# Patient Record
Sex: Male | Born: 1940 | Race: White | Hispanic: No | Marital: Married | Smoking: Former smoker
Health system: Southern US, Community
[De-identification: ages and names within clinical notes are randomized; demographics above are authoritative.]

## PROBLEM LIST (undated history)

## (undated) DIAGNOSIS — J61 Pneumoconiosis due to asbestos and other mineral fibers: Secondary | ICD-10-CM

## (undated) DIAGNOSIS — I10 Essential (primary) hypertension: Secondary | ICD-10-CM

## (undated) DIAGNOSIS — R972 Elevated prostate specific antigen [PSA]: Secondary | ICD-10-CM

## (undated) DIAGNOSIS — Z950 Presence of cardiac pacemaker: Secondary | ICD-10-CM

## (undated) DIAGNOSIS — E119 Type 2 diabetes mellitus without complications: Secondary | ICD-10-CM

## (undated) DIAGNOSIS — S02609A Fracture of mandible, unspecified, initial encounter for closed fracture: Secondary | ICD-10-CM

## (undated) DIAGNOSIS — M1990 Unspecified osteoarthritis, unspecified site: Secondary | ICD-10-CM

## (undated) DIAGNOSIS — Z87442 Personal history of urinary calculi: Secondary | ICD-10-CM

## (undated) DIAGNOSIS — K219 Gastro-esophageal reflux disease without esophagitis: Secondary | ICD-10-CM

## (undated) DIAGNOSIS — J189 Pneumonia, unspecified organism: Secondary | ICD-10-CM

## (undated) DIAGNOSIS — K862 Cyst of pancreas: Secondary | ICD-10-CM

## (undated) DIAGNOSIS — N529 Male erectile dysfunction, unspecified: Secondary | ICD-10-CM

## (undated) DIAGNOSIS — E785 Hyperlipidemia, unspecified: Secondary | ICD-10-CM

## (undated) DIAGNOSIS — I8390 Asymptomatic varicose veins of unspecified lower extremity: Secondary | ICD-10-CM

## (undated) DIAGNOSIS — N400 Enlarged prostate without lower urinary tract symptoms: Secondary | ICD-10-CM

## (undated) DIAGNOSIS — R51 Headache: Secondary | ICD-10-CM

## (undated) DIAGNOSIS — R519 Headache, unspecified: Secondary | ICD-10-CM

## (undated) HISTORY — PX: COLONOSCOPY: SHX174

## (undated) HISTORY — DX: Male erectile dysfunction, unspecified: N52.9

## (undated) HISTORY — DX: Essential (primary) hypertension: I10

## (undated) HISTORY — PX: HIP SURGERY: SHX245

## (undated) HISTORY — PX: NOSE SURGERY: SHX723

## (undated) HISTORY — DX: Gastro-esophageal reflux disease without esophagitis: K21.9

## (undated) HISTORY — DX: Asymptomatic varicose veins of unspecified lower extremity: I83.90

## (undated) HISTORY — DX: Benign prostatic hyperplasia without lower urinary tract symptoms: N40.0

## (undated) HISTORY — DX: Cyst of pancreas: K86.2

## (undated) HISTORY — DX: Hyperlipidemia, unspecified: E78.5

## (undated) HISTORY — PX: BACK SURGERY: SHX140

## (undated) HISTORY — DX: Elevated prostate specific antigen (PSA): R97.20

## (undated) HISTORY — DX: Fracture of mandible, unspecified, initial encounter for closed fracture: S02.609A

## (undated) HISTORY — DX: Type 2 diabetes mellitus without complications: E11.9

---

## 2001-04-20 ENCOUNTER — Emergency Department (HOSPITAL_COMMUNITY): Admission: EM | Admit: 2001-04-20 | Discharge: 2001-04-20 | Payer: Self-pay | Admitting: Emergency Medicine

## 2005-09-19 ENCOUNTER — Ambulatory Visit: Payer: Self-pay | Admitting: Internal Medicine

## 2005-09-28 LAB — HM COLONOSCOPY

## 2005-10-04 ENCOUNTER — Ambulatory Visit: Payer: Self-pay | Admitting: Internal Medicine

## 2006-12-26 ENCOUNTER — Encounter: Admission: RE | Admit: 2006-12-26 | Discharge: 2006-12-26 | Payer: Self-pay | Admitting: Orthopaedic Surgery

## 2008-04-08 ENCOUNTER — Ambulatory Visit (HOSPITAL_BASED_OUTPATIENT_CLINIC_OR_DEPARTMENT_OTHER): Admission: RE | Admit: 2008-04-08 | Discharge: 2008-04-08 | Payer: Self-pay | Admitting: Urology

## 2008-04-08 ENCOUNTER — Encounter (INDEPENDENT_AMBULATORY_CARE_PROVIDER_SITE_OTHER): Payer: Self-pay | Admitting: Urology

## 2011-01-10 NOTE — Op Note (Signed)
NAMEBRIGGS, Edgar Evans                ACCOUNT NO.:  [id number]   MEDICAL RECORD NO.:  [id number]          PATIENT TYPE:  AMB   LOCATION:  [facility]                         FACILITY:  [facility]   PHYSICIAN:  Valetta Fuller, M.D.  DATE OF BIRTH:  [birth year]   DATE OF PROCEDURE:  04/08/2008  DATE OF DISCHARGE:                               OPERATIVE REPORT   PREOPERATIVE DIAGNOSIS:  BPH (benign prostatic hypertrophy) with  obstruction.   POSTOPERATIVE DIAGNOSIS:  BPH (benign prostatic hypertrophy) with  obstruction.   PROCEDURE:  Transurethral resection of prostate.   ATTENDING PHYSICIAN:  Valetta Fuller, M.D.   RESIDENT PHYSICIAN:  Dr. Delman Kitten.   ANESTHESIA:  Was general.   INDICATIONS FOR PROCEDURE:  Edgar Evans is a 70 year old white male who  has been followed by Dr. Isabel Caprice for obstructive lower urinary tract  symptoms.  Initially he did respond to alpha blocker therapy.  However,  recently he has had less of a response.  He did have an approximately 60  gram gland on prostatic ultrasound and he has had a normal PSA recently.  In light of his longstanding voiding issues and diminished response now  to alpha blocker therapy with a peak flow of 7 mL per second and a mean  flow of 4 mL per second operative intervention was recommended after  discussion with Edgar Evans preoperatively regarding risks, benefits,  consequences and all concerns.  He elected to undergo transurethral  resection of the prostate and informed consent was obtained.   PROCEDURE IN DETAIL:  The patient was brought to the operating room and  placed in supine position.  He was correctly identified by his wrist  band and appropriate time-out was taken.  IV antibiotics were  administered and general anesthesia was delivered.  Once adequately  anesthetized he was placed in dorsal lithotomy position with great care  taken to minimize risk of peripheral neuropathy or compartment syndrome.  His perineum was prepped and  draped sterilely.  We began our procedure  by performing urethral dilation/calibration with male Sissy Hoff sounds.  He dilated very easily to 86 [place] but there was some resistance at 84  [place].  We were able to pass the 28 French rigid resectoscope sheath  over the obturator without significant difficulty however.  At this  point we inserted our scope and performed cystoscopy.  Both ureteral  orifices were noted be in the normal anatomic position effluxing clear  urine.  There were no urothelial abnormalities, foreign bodies, stones  or diverticula in the bladder.  We moved the scope into the urethra and  found that he had a high median bar as well as significant bilobar  prostatic hypertrophy with coaptation of the lateral lobes in the  midline.  We then initially started our resection with the gyrus PK  bipolar button starting on the patient's left lateral lobe.  We used  this to evaporate a fair amount of adenomatous tissue.  However, we soon  appreciated the true size of his gland and elected to use a bipolar  resectoscope loop from this point on.  Normal saline was our irrigant.  We continued our resection on the left lateral lobe and then moved to  his right lateral lobe as well as addressing his small median lobe but  high median bar.  We resected to a point where his bladder neck and  prostatic urethra were widely patent.  He still did have residual  adenomatous tissue circumferentially but we believe that we have removed  a significant amount of his adenoma which should significantly improve  his urinary symptoms.  We then spent a fair amount of time establishing  hemostasis with the electrocautery loop as well.  We evacuated the  bladder of all prostate chips.  We reinspected his bladder and both  ureteral orifices were still in their normal anatomic position and were  uninjured during our resection.  We then removed our cystoscope and  placed a 24 [place] three way Foley  catheter per urethra without any  difficulty.  We inflated the balloon with 40 mL of fluid and placed the  catheter on traction.  He was initially bloody but this drainage soon  improved after holding traction for a few minutes.  He was connected to  continuous bladder irrigation and this marked the end of our procedure.  He awoke from anesthesia and was taken to recovery room in stable  condition.  There were no complications.  Dr. Isabel Caprice was present and  participated in all aspects of the case.      Melina Schools, MD      Valetta Fuller, M.D.  Electronically Signed    JR/MEDQ  D:  04/08/2008  T:  04/08/2008  Job:  [id number]

## 2011-05-26 LAB — DIFFERENTIAL
Basophils Absolute: 0
Basophils Relative: 0
Eosinophils Absolute: 0
Eosinophils Relative: 0
Lymphocytes Relative: 6 — ABNORMAL LOW
Lymphs Abs: 0.7
Monocytes Absolute: 0.5
Monocytes Relative: 4
Neutro Abs: 10.7 — ABNORMAL HIGH
Neutrophils Relative %: 90 — ABNORMAL HIGH

## 2011-05-26 LAB — CBC
HCT: 36.4 — ABNORMAL LOW
Hemoglobin: 12 — ABNORMAL LOW
MCHC: 33.1
MCV: 89.8
Platelets: 138 — ABNORMAL LOW
RBC: 4.05 — ABNORMAL LOW
RDW: 12.5
WBC: 11.9 — ABNORMAL HIGH

## 2011-05-26 LAB — POCT I-STAT 4, (NA,K, GLUC, HGB,HCT)
Glucose, Bld: 125 — ABNORMAL HIGH
HCT: 42
Hemoglobin: 14.3
Potassium: 5
Sodium: 140

## 2012-10-16 ENCOUNTER — Encounter: Payer: Self-pay | Admitting: *Deleted

## 2013-12-28 ENCOUNTER — Emergency Department (HOSPITAL_COMMUNITY): Payer: Medicare Other

## 2013-12-28 ENCOUNTER — Encounter (HOSPITAL_COMMUNITY)
Admission: EM | Disposition: A | Discharge status: Home / Self Care | Payer: Self-pay | Source: Home / Self Care | Attending: Emergency Medicine

## 2013-12-28 ENCOUNTER — Encounter (HOSPITAL_COMMUNITY): Payer: Self-pay | Admitting: Emergency Medicine

## 2013-12-28 ENCOUNTER — Ambulatory Visit (HOSPITAL_COMMUNITY): Admit: 2013-12-28 | Payer: Self-pay | Admitting: Internal Medicine

## 2013-12-28 ENCOUNTER — Ambulatory Visit (HOSPITAL_COMMUNITY)
Admission: EM | Admit: 2013-12-28 | Discharge: 2013-12-29 | Disposition: A | Discharge status: Home / Self Care | Payer: Medicare Other | Attending: Internal Medicine | Admitting: Internal Medicine

## 2013-12-28 DIAGNOSIS — Z87891 Personal history of nicotine dependence: Secondary | ICD-10-CM | POA: Insufficient documentation

## 2013-12-28 DIAGNOSIS — Z7982 Long term (current) use of aspirin: Secondary | ICD-10-CM | POA: Insufficient documentation

## 2013-12-28 DIAGNOSIS — I442 Atrioventricular block, complete: Secondary | ICD-10-CM

## 2013-12-28 DIAGNOSIS — E785 Hyperlipidemia, unspecified: Secondary | ICD-10-CM | POA: Insufficient documentation

## 2013-12-28 DIAGNOSIS — K219 Gastro-esophageal reflux disease without esophagitis: Secondary | ICD-10-CM | POA: Insufficient documentation

## 2013-12-28 DIAGNOSIS — F528 Other sexual dysfunction not due to a substance or known physiological condition: Secondary | ICD-10-CM | POA: Insufficient documentation

## 2013-12-28 DIAGNOSIS — R55 Syncope and collapse: Secondary | ICD-10-CM

## 2013-12-28 DIAGNOSIS — N400 Enlarged prostate without lower urinary tract symptoms: Secondary | ICD-10-CM | POA: Insufficient documentation

## 2013-12-28 HISTORY — PX: PACEMAKER INSERTION: SHX728

## 2013-12-28 HISTORY — PX: PERMANENT PACEMAKER INSERTION: SHX5480

## 2013-12-28 LAB — BASIC METABOLIC PANEL
BUN: 19 mg/dL (ref 6–23)
CO2: 27 mEq/L (ref 19–32)
Calcium: 9.2 mg/dL (ref 8.4–10.5)
Chloride: 102 mEq/L (ref 96–112)
Creatinine, Ser: 1.02 mg/dL (ref 0.50–1.35)
GFR calc Af Amer: 81 mL/min — ABNORMAL LOW (ref >90)
GFR calc non Af Amer: 70 mL/min — ABNORMAL LOW (ref >90)
Glucose, Bld: 161 mg/dL — ABNORMAL HIGH (ref 70–99)
Potassium: 3.8 mEq/L (ref 3.7–5.3)
Sodium: 141 mEq/L (ref 137–147)

## 2013-12-28 LAB — CBC WITH DIFFERENTIAL/PLATELET
Basophils Absolute: 0 10*3/uL (ref 0.0–0.1)
Basophils Relative: 0 % (ref 0–1)
Eosinophils Absolute: 0.3 10*3/uL (ref 0.0–0.7)
Eosinophils Relative: 5 % (ref 0–5)
HCT: 41.2 % (ref 39.0–52.0)
Hemoglobin: 13.9 g/dL (ref 13.0–17.0)
Lymphocytes Relative: 45 % (ref 12–46)
Lymphs Abs: 2 10*3/uL (ref 0.7–4.0)
MCH: 29.4 pg (ref 26.0–34.0)
MCHC: 33.7 g/dL (ref 30.0–36.0)
MCV: 87.3 fL (ref 78.0–100.0)
Monocytes Absolute: 0.3 10*3/uL (ref 0.1–1.0)
Monocytes Relative: 7 % (ref 3–12)
Neutro Abs: 2 10*3/uL (ref 1.7–7.7)
Neutrophils Relative %: 43 % (ref 43–77)
Platelets: 152 10*3/uL (ref 150–400)
RBC: 4.72 MIL/uL (ref 4.22–5.81)
RDW: 12.6 % (ref 11.5–15.5)
WBC: 4.6 10*3/uL (ref 4.0–10.5)

## 2013-12-28 LAB — TYPE AND SCREEN
ABO/RH(D): A POS
Antibody Screen: NEGATIVE

## 2013-12-28 LAB — ABO/RH: ABO/RH(D): A POS

## 2013-12-28 LAB — I-STAT TROPONIN, ED: Troponin i, poc: 0 ng/mL (ref 0.00–0.08)

## 2013-12-28 LAB — MAGNESIUM: Magnesium: 1.9 mg/dL (ref 1.5–2.5)

## 2013-12-28 SURGERY — PERMANENT PACEMAKER INSERTION
Anesthesia: LOCAL

## 2013-12-28 MED ORDER — SODIUM CHLORIDE 0.9 % IV SOLN: 250.0000 mL | INTRAVENOUS | Status: DC | PRN

## 2013-12-28 MED ORDER — SODIUM CHLORIDE 0.9 % IJ SOLN
3.0000 mL | Freq: Two times a day (BID) | INTRAMUSCULAR | Status: DC
Start: 2013-12-28 — End: 2013-12-29
  Administered 2013-12-28: 3 mL via INTRAVENOUS

## 2013-12-28 MED ORDER — ONDANSETRON HCL 4 MG/2ML IJ SOLN: 4.0000 mg | Freq: Four times a day (QID) | INTRAMUSCULAR | Status: DC | PRN

## 2013-12-28 MED ORDER — VANCOMYCIN HCL IN DEXTROSE 1-5 GM/200ML-% IV SOLN
1000.0000 mg | INTRAVENOUS | Status: DC
  Filled 2013-12-28: qty 200

## 2013-12-28 MED ORDER — SODIUM CHLORIDE 0.9 % IV BOLUS (SEPSIS): 1000.0000 mL | Freq: Once | INTRAVENOUS | Status: DC

## 2013-12-28 MED ORDER — SODIUM CHLORIDE 0.9 % IV SOLN: INTRAVENOUS | Status: DC

## 2013-12-28 MED ORDER — SODIUM CHLORIDE 0.9 % IJ SOLN: 3.0000 mL | INTRAMUSCULAR | Status: DC | PRN

## 2013-12-28 MED ORDER — HYDROCODONE-ACETAMINOPHEN 5-325 MG PO TABS
1.0000 | ORAL_TABLET | ORAL | Status: DC | PRN
  Administered 2013-12-28: 1 via ORAL
  Filled 2013-12-28: qty 1

## 2013-12-28 MED ORDER — SODIUM CHLORIDE 0.9 % IV SOLN
Freq: Once | INTRAVENOUS | Status: AC
  Administered 2013-12-28: 06:00:00 via INTRAVENOUS

## 2013-12-28 MED ORDER — VANCOMYCIN HCL IN DEXTROSE 1-5 GM/200ML-% IV SOLN
1000.0000 mg | Freq: Two times a day (BID) | INTRAVENOUS | Status: AC
  Administered 2013-12-28: 1000 mg via INTRAVENOUS
  Filled 2013-12-28: qty 200

## 2013-12-28 MED ORDER — ACETAMINOPHEN 325 MG PO TABS: 325.0000 mg | ORAL_TABLET | ORAL | Status: DC | PRN

## 2013-12-28 MED ORDER — GENTAMICIN SULFATE 40 MG/ML IJ SOLN
80.0000 mg | INTRAMUSCULAR | Status: DC
  Filled 2013-12-28: qty 2

## 2013-12-28 MED ORDER — MIDAZOLAM HCL 5 MG/5ML IJ SOLN
INTRAMUSCULAR | Status: AC
  Filled 2013-12-28: qty 5

## 2013-12-28 MED ORDER — MIDAZOLAM HCL 2 MG/2ML IJ SOLN
4.0000 mg | Freq: Once | INTRAMUSCULAR | Status: AC
  Administered 2013-12-28: 4 mg via INTRAVENOUS

## 2013-12-28 MED ORDER — MIDAZOLAM HCL 2 MG/2ML IJ SOLN
INTRAMUSCULAR | Status: AC
  Filled 2013-12-28: qty 4

## 2013-12-28 NOTE — ED Notes (Signed)
Patient had 2-3 sec episode of ventricular standstill.

## 2013-12-28 NOTE — Op Note (Signed)
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   Pacemaker implantation.     INTRODUCTION:  Edgar Evans is a 73 y.o. 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  The patient reports having several episodes of abrupt syncope.  He is documented to have transient complete heart block.  No reversible causes have been identified.  The patient therefore presents today for pacemaker implantation.     DESCRIPTION OF PROCEDURE:  Informed written consent was obtained, and  the patient was brought to the electrophysiology lab in a fasting state.  The patient required no sedation for the procedure today.  The patients left chest was prepped and draped in the usual sterile fashion by the EP lab staff. The skin overlying the left deltopectoral region was infiltrated with lidocaine for local analgesia.  A 4-cm incision was made over the left deltopectoral region.  A left subcutaneous pacemaker pocket was fashioned using a combination of sharp and blunt dissection. Electrocautery was required to assure hemostasis.    RA/RV Lead Placement: The left axillary vein was therefore cannulated.  Through the left axillary vein, a [employer] model [id number] (serial number  [id number]) right atrial lead and a [facility] model [id number] (serial number [id number]) right ventricular lead were advanced with fluoroscopic visualization into the right atrial appendage and right ventricular apex positions respectively.  Initial atrial lead P- waves measured 2.8 mV with impedance of 651 ohms and a threshold of 1.3 V at 0.5 msec.  Right ventricular lead R-waves measured 11.6 mV with an impedance of 862 ohms and a threshold of 0.4 V at 0.5 msec.  Both leads were secured to the  pectoralis fascia using #2-0 silk over the suture sleeves.   Device Placement:  The leads were then connected to a [employer] DR  model [id number] (serial number  [id number]) pacemaker.  The pocket was irrigated with copious gentamicin solution.  The pacemaker was then placed into the pocket.  The pocket was then closed in 2 layers with 2.0 Vicryl suture for the subcutaneous and subcuticular layers.  Steri-  Strips and a sterile dressing were then applied.  There were no early apparent complications.     CONCLUSIONS:   1. Successful implantation of a [employer] DR  dual-chamber pacemaker for transient complete heart block with syncope  2. No early apparent complications.           Thompson Grayer, MD 12/28/2013 8:45 AM
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
## 2013-12-28 NOTE — ED Notes (Signed)
1-2 sec episode of ventricular standstill.

## 2013-12-28 NOTE — Progress Notes (Signed)
Chaplain requested to offer family support. Upon arrival, family said that patient would be receiving a pacemaker. Pt's wife was present in trauma B and additional family were in consultation room B. Chaplain provided emotional support through hospitality, empathic listening, pastoral presence, and prayer. Escorted family to cath lab waiting area and notified physician of family's location. Please page for follow up.   [place], [place]

## 2013-12-28 NOTE — ED Notes (Signed)
Patient had 2-3 sec pause of ventricular standstill.

## 2013-12-28 NOTE — ED Notes (Signed)
Consent signed by wife, left chest shaved of hair.

## 2013-12-28 NOTE — Progress Notes (Signed)
Orthopedic Tech Progress Note Patient Details:  Edgar Evans 07-03-1941 [id number]  Ortho Devices Type of Ortho Device: Arm sling Ortho Device/Splint Interventions: Application   Edgar Evans 12/28/2013, 12:35 PM

## 2013-12-28 NOTE — ED Notes (Addendum)
Pacing turned off at this time per Dr Percival Spanish.

## 2013-12-28 NOTE — ED Notes (Signed)
Patient with episode of 1-2 secs of ventricular standstill.

## 2013-12-28 NOTE — ED Notes (Addendum)
Dr Percival Spanish in at bedside.
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
## 2013-12-28 NOTE — ED Notes (Signed)
Patient from home, having two syncopal episodes at home.  Patient was found to have 6-8 secs of ventricular standstill by EMS.  Patient being paced by EMS at mA 100 and rate of 60.  Patient is CAOx3.  Patient had 6 syncopal episodes en route to ED.

## 2013-12-28 NOTE — ED Notes (Signed)
Patient being paced at this time. 39mAmps, with rate at 68.

## 2013-12-29 ENCOUNTER — Ambulatory Visit (HOSPITAL_COMMUNITY): Payer: Medicare Other

## 2013-12-29 DIAGNOSIS — I442 Atrioventricular block, complete: Secondary | ICD-10-CM

## 2013-12-29 LAB — BASIC METABOLIC PANEL
BUN: 16 mg/dL (ref 6–23)
CO2: 26 mEq/L (ref 19–32)
Calcium: 8.6 mg/dL (ref 8.4–10.5)
Chloride: 104 mEq/L (ref 96–112)
Creatinine, Ser: 1.09 mg/dL (ref 0.50–1.35)
GFR calc Af Amer: 75 mL/min — ABNORMAL LOW (ref >90)
GFR calc non Af Amer: 64 mL/min — ABNORMAL LOW (ref >90)
Glucose, Bld: 130 mg/dL — ABNORMAL HIGH (ref 70–99)
Potassium: 4.3 mEq/L (ref 3.7–5.3)
Sodium: 141 mEq/L (ref 137–147)

## 2013-12-29 NOTE — Progress Notes (Signed)
Pt discharged per written instructions. Wife at bedside. All d/c instructions/orders/follow up care and appts/medicines/wound care discussed. Pt and wife verbalized understanding regarding their ICD monitor and how to plug in/set up at home. Teach back methods utilized in regards to ICD firing once, twice, and three times and when to call doctor versus calling when to call 911. Discussed activity and arm limitations. Time allowed for questions and concerns. Pt and his wife state they have none.  Pt and his wife left the unit with family members and a volunteer who transported the patient via wheelchair.  - Soyla Dryer, RN

## 2013-12-29 NOTE — Discharge Summary (Signed)
ELECTROPHYSIOLOGY PROCEDURE DISCHARGE SUMMARY    Patient ID: Edgar Evans,  MRN: [id number], DOB/AGE: 1941-01-15 73 y.o.  Admit date: 12/28/2013 Discharge date: 12/29/2013  Primary Care Physician: No primary provider on file. 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  GERD 2.  BPH 3.  Hyperlipidemia  Allergies  Allergen Reactions  . Penicillins Swelling    Tongue  . Tetanus Toxoids Swelling    Tongue     Procedures This Admission:  1.  Implantation of a STJ dual chamber pacemaker on 12-28-13 by Dr Rayann Heman.  The patient received a STJ Assurity dual chamber pacemaker with model number 2088 right atrial lead and 1948 right ventricular lead.  There were no early apparent complications. ' 2.  CXR on 12-29-2013 demonstrated stable lead position and no ptx  Brief HPI/Hospital Course:  Edgar Evans is a 73 y.o. male with a past medical history significant for GERD, BPH, hyperlipidemia. He had an abrupt syncopal spell about a month ago and was evaluated by his PCP with 24 hour holter monitor and labwork which were unremarkable. He was in [place] the night prior to admission for a wedding and the morning of admission around 5AM had another syncopal spell. His wife called EMS and he had several more episodes en route and then in the ER. Telemetry during syncope demonstrates intermittent complete heart block. He required transcutaneous pacing. He was evaluated by Dr Rayann Heman who recommended permanent pacemaker implantation. Risks, benefits, and alternatives were reviewed with the patient who wished to proceed.  Recent echo in [place] was normal per patient report.  There were no other reversible causes identified.  The patient  underwent implantation of a STJ dual chamber pacemaker with details as outlined above.   He was  monitored on telemetry overnight which demonstrated sinus rhythm with intermittent ventricular pacing.  Left chest was without hematoma or ecchymosis.  The device was interrogated and found to be functioning normally.  CXR was obtained and demonstrated no pneumothorax status post device implantation.  Wound care, arm mobility, and restrictions were reviewed with the patient.  Dr Caryl Comes examined the patient and considered them stable for discharge to home.    Discharge Vitals: Blood pressure 130/73, pulse 68, temperature 98 F (36.7 C), temperature source Oral, resp. rate 18, weight 179 lb 14.3 oz (81.6 kg), SpO2 97.00%. Well developed and nourished in no acute distress HENT normal Neck supple with JVP-flat Clear Device pocket   without hematoma or erythema.   Regular rate and rhythm, no murmurs or gallops Abd-soft with active BS No Clubbing cyanosis edema Skin-warm and dry A & Oriented  Grossly normal sensory and motor function  Labs:   Lab Results  Component Value Date   WBC 4.6 12/28/2013   HGB 13.9 12/28/2013   HCT 41.2 12/28/2013   MCV 87.3 12/28/2013   PLT 152 12/28/2013    Recent Labs Lab 12/29/13 0345  NA 141  K 4.3  CL 104  CO2 26  BUN 16  CREATININE 1.09  CALCIUM 8.6  GLUCOSE 130*     Discharge Medications:    Medication List    ASK your doctor about these medications       aspirin EC 81 MG tablet  Take 81 mg by mouth daily.     fish oil-omega-3 fatty acids 1000 MG capsule  Take 1 g by mouth  daily.     simvastatin 40 MG tablet  Commonly known as:  ZOCOR  Take 40 mg by mouth at bedtime.     vitamin B-12 500 MCG tablet  Commonly known as:  CYANOCOBALAMIN  Take 500 mcg by mouth daily.     vitamin E 1000 UNIT capsule  Take 500 Units by mouth daily.        Disposition:       Future Appointments Provider Department Dept Phone   01/09/2014 11:30 AM [facility] [place] [phone]   02/20/2014 9:20 AM Herminio Commons, MD [place] [phone]   04/24/2014 10:15 AM Thompson Grayer, MD [place] [phone]       Duration of Discharge Encounter: Greater than 30 minutes including physician time.   Instructions reveiwed

## 2013-12-29 NOTE — Discharge Instructions (Signed)
° ° °  Supplemental Discharge Instructions for  Pacemaker/Defibrillator Patients  Activity No heavy lifting or vigorous activity with your left/right arm for 6 to 8 weeks.  Do not raise your left/right arm above your head for one week.  Gradually raise your affected arm as drawn below.             5/6                         5/7                             5/8                         5/9  NO DRIVING for until your appointment for a wound check.  WOUND CARE   Keep the wound area clean and dry.  Do not get this area wet for one week. No showers for one week; you may shower on     .   The tape/steri-strips on your wound will fall off; do not pull them off.  No bandage is needed on the site.  DO  NOT apply any creams, oils, or ointments to the wound area.   If you notice any drainage or discharge from the wound, any swelling or bruising at the site, or you develop a fever > 101? F after you are discharged home, call the office at once.  Special Instructions   You are still able to use cellular telephones; use the ear opposite the side where you have your pacemaker/defibrillator.  Avoid carrying your cellular phone near your device.   When traveling through airports, show security personnel your identification card to avoid being screened in the metal detectors.  Ask the security personnel to use the hand wand.   Avoid arc welding equipment, MRI testing (magnetic resonance imaging), TENS units (transcutaneous nerve stimulators).  Call the office for questions about other devices.   Avoid electrical appliances that are in poor condition or are not properly grounded.   Microwave ovens are safe to be near or to operate.  Additional information for defibrillator patients should your device go off:   If your device goes off ONCE and you feel fine afterward, notify the device clinic nurses.   If your device goes off ONCE and you do not feel well afterward, call 911.   If your device goes off TWICE,  call 911.   If your device goes off THREE times in one day, call 911.  DO NOT DRIVE YOURSELF OR A FAMILY MEMBER WITH A DEFIBRILLATOR TO THE HOSPITAL--CALL 911.

## 2014-01-09 ENCOUNTER — Ambulatory Visit (INDEPENDENT_AMBULATORY_CARE_PROVIDER_SITE_OTHER): Payer: Medicare Other | Admitting: *Deleted

## 2014-01-09 DIAGNOSIS — R55 Syncope and collapse: Secondary | ICD-10-CM

## 2014-01-09 DIAGNOSIS — I442 Atrioventricular block, complete: Secondary | ICD-10-CM

## 2014-01-09 LAB — MDC_IDC_ENUM_SESS_TYPE_INCLINIC
Battery Remaining Longevity: 144 mo
Battery Voltage: 3.11 V
Brady Statistic RA Percent Paced: 0.1 %
Brady Statistic RV Percent Paced: 0 %
Date Time Interrogation Session: 20150515125227
Implantable Pulse Generator Model: 2240
Implantable Pulse Generator Serial Number: 7618665
Lead Channel Impedance Value: 562.5 Ohm
Lead Channel Impedance Value: 737.5 Ohm
Lead Channel Pacing Threshold Amplitude: 0.5 V
Lead Channel Pacing Threshold Amplitude: 1.25 V
Lead Channel Pacing Threshold Pulse Width: 0.5 ms
Lead Channel Pacing Threshold Pulse Width: 0.5 ms
Lead Channel Sensing Intrinsic Amplitude: 12 mV
Lead Channel Sensing Intrinsic Amplitude: 5 mV
Lead Channel Setting Pacing Amplitude: 3.5 V
Lead Channel Setting Pacing Amplitude: 3.5 V
Lead Channel Setting Pacing Pulse Width: 0.5 ms
Lead Channel Setting Sensing Sensitivity: 2 mV

## 2014-01-09 NOTE — Progress Notes (Signed)
Wound check ppm in clinic. Site well healed with no swelling or redness. Normal device function. Battery longevity 6.0 to 12 years. No mode switch episodes or ventricular high rate episodes. ROV 04-24-14 @ 1015 with [facility].

## 2014-01-12 ENCOUNTER — Encounter: Payer: Self-pay | Admitting: Cardiovascular Disease

## 2014-01-15 ENCOUNTER — Telehealth: Payer: Self-pay | Admitting: Internal Medicine

## 2014-01-15 ENCOUNTER — Telehealth: Payer: Self-pay | Admitting: *Deleted

## 2014-01-15 ENCOUNTER — Encounter: Payer: Self-pay | Admitting: Internal Medicine

## 2014-01-15 NOTE — Telephone Encounter (Signed)
Received call from patient wife, Collie Siad.   Reports that patient had pacemaker placed on 12/28/2013. States that he is having a lot of tooth pain and was concerned about the need for ABTx before dental procedures.   MD please advise.

## 2014-01-15 NOTE — Telephone Encounter (Signed)
Follow up     Had pacemaker put in may 3rd. Can he have a tooth extracted tomorrow?

## 2014-01-15 NOTE — Telephone Encounter (Signed)
No antibiotic needed per Dr Rayann Heman. Ok to proceed

## 2014-01-15 NOTE — Telephone Encounter (Signed)
New message     Pt got pacemaker on 5-3rd.  Need tooth extracted.  Will he need an antibiotic first?

## 2014-02-20 ENCOUNTER — Ambulatory Visit (INDEPENDENT_AMBULATORY_CARE_PROVIDER_SITE_OTHER): Payer: Medicare Other | Admitting: Cardiovascular Disease

## 2014-02-20 ENCOUNTER — Encounter: Payer: Self-pay | Admitting: Cardiovascular Disease

## 2014-02-20 VITALS — BP 144/75 | HR 64 | Ht 72.0 in | Wt 182.0 lb

## 2014-02-20 DIAGNOSIS — Z950 Presence of cardiac pacemaker: Secondary | ICD-10-CM

## 2014-02-20 DIAGNOSIS — I442 Atrioventricular block, complete: Secondary | ICD-10-CM

## 2014-02-20 DIAGNOSIS — E785 Hyperlipidemia, unspecified: Secondary | ICD-10-CM
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
## 2014-02-20 NOTE — Patient Instructions (Signed)
Continue all current medications. Your physician wants you to follow up in: 6 months.  You will receive a reminder letter in the mail one-two months in advance.  If you don't receive a letter, please call our office to schedule the follow up appointment   

## 2014-04-24 ENCOUNTER — Ambulatory Visit (INDEPENDENT_AMBULATORY_CARE_PROVIDER_SITE_OTHER): Payer: Medicare Other | Admitting: Internal Medicine

## 2014-04-24 ENCOUNTER — Encounter: Payer: Self-pay | Admitting: Internal Medicine

## 2014-04-24 VITALS — BP 149/67 | HR 60 | Ht 72.0 in | Wt 187.0 lb

## 2014-04-24 DIAGNOSIS — I442 Atrioventricular block, complete: Secondary | ICD-10-CM

## 2014-04-24 DIAGNOSIS — Z950 Presence of cardiac pacemaker: Secondary | ICD-10-CM

## 2014-04-24 DIAGNOSIS — I459 Conduction disorder, unspecified: Secondary | ICD-10-CM

## 2014-04-24 LAB — MDC_IDC_ENUM_SESS_TYPE_INCLINIC
Battery Remaining Longevity: 138 mo
Battery Voltage: 3.04 V
Brady Statistic RA Percent Paced: 11 %
Brady Statistic RV Percent Paced: 0.05 %
Date Time Interrogation Session: 20150828133636
Implantable Pulse Generator Model: 2240
Implantable Pulse Generator Serial Number: 7618665
Lead Channel Impedance Value: 475 Ohm
Lead Channel Impedance Value: 687.5 Ohm
Lead Channel Pacing Threshold Amplitude: 0.5 V
Lead Channel Pacing Threshold Amplitude: 0.75 V
Lead Channel Pacing Threshold Pulse Width: 0.5 ms
Lead Channel Pacing Threshold Pulse Width: 0.5 ms
Lead Channel Sensing Intrinsic Amplitude: 12 mV
Lead Channel Sensing Intrinsic Amplitude: 5 mV
Lead Channel Setting Pacing Amplitude: 2 V
Lead Channel Setting Pacing Amplitude: 2.5 V
Lead Channel Setting Pacing Pulse Width: 0.5 ms
Lead Channel Setting Sensing Sensitivity: 2 mV
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
## 2014-04-24 NOTE — Patient Instructions (Addendum)
Your physician recommends that you schedule a follow-up appointment in: 1 year. You will receive a reminder letter in the mail in about 10 months reminding you to call and schedule your appointment. If you don't receive this letter, please contact our office. Merlin device check on 07/27/14 at home. Your physician recommends that you continue on your current medications as directed. Please refer to the Current Medication list given to you today. You have been given information today to follow a low sodium diet.

## 2014-05-01 ENCOUNTER — Encounter: Payer: Self-pay | Admitting: *Deleted

## 2014-05-06 ENCOUNTER — Encounter: Payer: Self-pay | Admitting: Internal Medicine

## 2014-05-21 ENCOUNTER — Ambulatory Visit (INDEPENDENT_AMBULATORY_CARE_PROVIDER_SITE_OTHER): Payer: Medicare Other | Admitting: *Deleted

## 2014-05-21 DIAGNOSIS — Z23 Encounter for immunization: Secondary | ICD-10-CM

## 2014-05-21 NOTE — Progress Notes (Signed)
Patient ID: Edgar Evans, male   DOB: [birth year], 73 y.o.   MRN: [id number] Patient seen in office for Influenza Vaccination and Pneumococcal Vaccination.   Tolerated IM administration well.

## 2014-07-27 ENCOUNTER — Ambulatory Visit (INDEPENDENT_AMBULATORY_CARE_PROVIDER_SITE_OTHER): Payer: Medicare Other | Admitting: *Deleted

## 2014-07-27 ENCOUNTER — Encounter: Payer: Self-pay | Admitting: Internal Medicine

## 2014-07-27 DIAGNOSIS — I442 Atrioventricular block, complete: Secondary | ICD-10-CM

## 2014-07-27 LAB — MDC_IDC_ENUM_SESS_TYPE_REMOTE
Battery Remaining Longevity: 124 mo
Battery Remaining Percentage: 95.5 %
Battery Voltage: 3.02 V
Brady Statistic AP VP Percent: 1 %
Brady Statistic AP VS Percent: 9.8 %
Brady Statistic AS VP Percent: 1 %
Brady Statistic AS VS Percent: 90 %
Brady Statistic RA Percent Paced: 9.5 %
Brady Statistic RV Percent Paced: 1 %
Date Time Interrogation Session: 20151130090625
Implantable Pulse Generator Model: 2240
Implantable Pulse Generator Serial Number: 7618665
Lead Channel Impedance Value: 480 Ohm
Lead Channel Impedance Value: 630 Ohm
Lead Channel Pacing Threshold Amplitude: 0.5 V
Lead Channel Pacing Threshold Amplitude: 0.75 V
Lead Channel Pacing Threshold Pulse Width: 0.5 ms
Lead Channel Pacing Threshold Pulse Width: 0.5 ms
Lead Channel Sensing Intrinsic Amplitude: 12 mV
Lead Channel Sensing Intrinsic Amplitude: 5 mV
Lead Channel Setting Pacing Amplitude: 2 V
Lead Channel Setting Pacing Amplitude: 2.5 V
Lead Channel Setting Pacing Pulse Width: 0.5 ms
Lead Channel Setting Sensing Sensitivity: 2 mV

## 2014-07-29 NOTE — Progress Notes (Signed)
Remote pacemaker transmission.   

## 2014-08-06 ENCOUNTER — Encounter (HOSPITAL_COMMUNITY): Payer: Self-pay | Admitting: Internal Medicine

## 2014-08-11 ENCOUNTER — Encounter: Payer: Self-pay | Admitting: Internal Medicine

## 2014-08-21 ENCOUNTER — Encounter: Payer: Self-pay | Admitting: Internal Medicine

## 2014-08-24 ENCOUNTER — Encounter: Payer: Self-pay | Admitting: Cardiovascular Disease

## 2014-08-24 ENCOUNTER — Ambulatory Visit (INDEPENDENT_AMBULATORY_CARE_PROVIDER_SITE_OTHER): Payer: Medicare Other | Admitting: Cardiovascular Disease

## 2014-08-24 ENCOUNTER — Encounter: Payer: Self-pay | Admitting: *Deleted

## 2014-08-24 VITALS — BP 143/72 | HR 67 | Ht 72.0 in | Wt 191.0 lb

## 2014-08-24 DIAGNOSIS — E785 Hyperlipidemia, unspecified: Secondary | ICD-10-CM

## 2014-08-24 DIAGNOSIS — I10 Essential (primary) hypertension: Secondary | ICD-10-CM

## 2014-08-24 DIAGNOSIS — I442 Atrioventricular block, complete: Secondary | ICD-10-CM

## 2014-08-24 DIAGNOSIS — Z950 Presence of cardiac pacemaker: Secondary | ICD-10-CM

## 2014-08-24 MED ORDER — LISINOPRIL 5 MG PO TABS: 5.0000 mg | ORAL_TABLET | Freq: Every day | ORAL | Status: DC

## 2014-08-24 NOTE — Patient Instructions (Signed)
   Begin Lisinopril 5mg  daily - new sent to pharm Continue all other medications.   Lab for BMET - due in 2 weeks Office will contact with results via phone or letter.   Your physician wants you to follow up in:  1 year.  You will receive a reminder letter in the mail one-two months in advance.  If you don't receive a letter, please call our office to schedule the follow up appointment
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
## 2014-09-03 DIAGNOSIS — M25562 Pain in left knee: Secondary | ICD-10-CM | POA: Diagnosis not present

## 2014-09-03 DIAGNOSIS — M1712 Unilateral primary osteoarthritis, left knee: Secondary | ICD-10-CM | POA: Diagnosis not present

## 2014-09-07 DIAGNOSIS — I10 Essential (primary) hypertension: Secondary | ICD-10-CM | POA: Diagnosis not present

## 2014-09-09 ENCOUNTER — Telehealth: Payer: Self-pay | Admitting: *Deleted

## 2014-09-09 NOTE — Telephone Encounter (Signed)
Pt made aware, will forward to Dr. Dennard Schaumann

## 2014-09-09 NOTE — Telephone Encounter (Signed)
-----   Message from Herminio Commons, MD sent at 09/08/2014  2:30 PM EST ----- Labs ok. He appears dehydrated, thus would encourage more water intake.

## 2014-09-21 DIAGNOSIS — R3912 Poor urinary stream: Secondary | ICD-10-CM | POA: Diagnosis not present

## 2014-09-21 DIAGNOSIS — R972 Elevated prostate specific antigen [PSA]: Secondary | ICD-10-CM | POA: Diagnosis not present

## 2014-09-21 DIAGNOSIS — N401 Enlarged prostate with lower urinary tract symptoms: Secondary | ICD-10-CM | POA: Diagnosis not present

## 2014-09-21 DIAGNOSIS — R339 Retention of urine, unspecified: Secondary | ICD-10-CM | POA: Diagnosis not present

## 2014-09-30 ENCOUNTER — Encounter: Payer: Self-pay | Admitting: Internal Medicine

## 2014-10-09 ENCOUNTER — Telehealth: Payer: Self-pay | Admitting: Cardiovascular Disease

## 2014-10-09 NOTE — Telephone Encounter (Signed)
SOB increasing with activity, started today.  Noticed a little dizziness this morning.  No chest pain.   BP 130's / 70's   Heart rate 70's .

## 2014-10-09 NOTE — Telephone Encounter (Signed)
Has been experiencing SOB no other symptoms BP has be running around 140 This morning 117/79 pulse 99 just a few minutes ago

## 2014-10-12 NOTE — Telephone Encounter (Signed)
BP and HR seem stable. Would have him evaluated by PCP.

## 2014-10-13 NOTE — Telephone Encounter (Signed)
Patient notified

## 2014-10-26 ENCOUNTER — Ambulatory Visit (INDEPENDENT_AMBULATORY_CARE_PROVIDER_SITE_OTHER): Payer: Medicare Other | Admitting: *Deleted

## 2014-10-26 DIAGNOSIS — I442 Atrioventricular block, complete: Secondary | ICD-10-CM

## 2014-10-26 LAB — MDC_IDC_ENUM_SESS_TYPE_REMOTE
Battery Remaining Longevity: 123 mo
Battery Remaining Percentage: 95.5 %
Battery Voltage: 3.02 V
Brady Statistic AP VP Percent: 1 %
Brady Statistic AP VS Percent: 8.4 %
Brady Statistic AS VP Percent: 1 %
Brady Statistic AS VS Percent: 92 %
Brady Statistic RA Percent Paced: 8.2 %
Brady Statistic RV Percent Paced: 1 %
Date Time Interrogation Session: 20160229070018
Implantable Pulse Generator Model: 2240
Implantable Pulse Generator Serial Number: 7618665
Lead Channel Impedance Value: 490 Ohm
Lead Channel Impedance Value: 660 Ohm
Lead Channel Pacing Threshold Amplitude: 0.5 V
Lead Channel Pacing Threshold Amplitude: 0.75 V
Lead Channel Pacing Threshold Pulse Width: 0.5 ms
Lead Channel Pacing Threshold Pulse Width: 0.5 ms
Lead Channel Sensing Intrinsic Amplitude: 12 mV
Lead Channel Sensing Intrinsic Amplitude: 5 mV
Lead Channel Setting Pacing Amplitude: 2 V
Lead Channel Setting Pacing Amplitude: 2.5 V
Lead Channel Setting Pacing Pulse Width: 0.5 ms
Lead Channel Setting Sensing Sensitivity: 2 mV

## 2014-10-26 NOTE — Progress Notes (Signed)
Remote pacemaker transmission.   

## 2014-11-09 ENCOUNTER — Encounter: Payer: Self-pay | Admitting: Internal Medicine

## 2014-11-09 ENCOUNTER — Encounter: Payer: Self-pay | Admitting: Cardiology

## 2014-11-09 DIAGNOSIS — N401 Enlarged prostate with lower urinary tract symptoms: Secondary | ICD-10-CM | POA: Diagnosis not present

## 2014-11-09 DIAGNOSIS — N138 Other obstructive and reflux uropathy: Secondary | ICD-10-CM | POA: Diagnosis not present

## 2014-11-09 DIAGNOSIS — R3912 Poor urinary stream: Secondary | ICD-10-CM | POA: Diagnosis not present

## 2014-11-09 DIAGNOSIS — R339 Retention of urine, unspecified: Secondary | ICD-10-CM | POA: Diagnosis not present

## 2014-11-13 ENCOUNTER — Encounter: Payer: Self-pay | Admitting: Internal Medicine

## 2014-12-22 DIAGNOSIS — M1712 Unilateral primary osteoarthritis, left knee: Secondary | ICD-10-CM | POA: Diagnosis not present

## 2014-12-31 ENCOUNTER — Encounter: Payer: Self-pay | Admitting: Family Medicine

## 2014-12-31 ENCOUNTER — Other Ambulatory Visit: Payer: Self-pay | Admitting: Family Medicine

## 2014-12-31 ENCOUNTER — Ambulatory Visit (INDEPENDENT_AMBULATORY_CARE_PROVIDER_SITE_OTHER): Payer: Medicare Other | Admitting: Family Medicine

## 2014-12-31 VITALS — BP 130/64 | HR 80 | Temp 97.6°F | Resp 16 | Ht 72.0 in | Wt 195.0 lb

## 2014-12-31 DIAGNOSIS — Z01818 Encounter for other preprocedural examination: Secondary | ICD-10-CM

## 2014-12-31 DIAGNOSIS — R7309 Other abnormal glucose: Secondary | ICD-10-CM | POA: Diagnosis not present
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
## 2015-01-01 LAB — CBC WITH DIFFERENTIAL/PLATELET
Basophils Absolute: 0 10*3/uL (ref 0.0–0.1)
Basophils Relative: 1 % (ref 0–1)
Eosinophils Absolute: 0.2 10*3/uL (ref 0.0–0.7)
Eosinophils Relative: 4 % (ref 0–5)
HCT: 41 % (ref 39.0–52.0)
Hemoglobin: 13.6 g/dL (ref 13.0–17.0)
Lymphocytes Relative: 38 % (ref 12–46)
Lymphs Abs: 1.8 10*3/uL (ref 0.7–4.0)
MCH: 29.1 pg (ref 26.0–34.0)
MCHC: 33.2 g/dL (ref 30.0–36.0)
MCV: 87.6 fL (ref 78.0–100.0)
MPV: 12.2 fL (ref 8.6–12.4)
Monocytes Absolute: 0.2 10*3/uL (ref 0.1–1.0)
Monocytes Relative: 5 % (ref 3–12)
Neutro Abs: 2.4 10*3/uL (ref 1.7–7.7)
Neutrophils Relative %: 52 % (ref 43–77)
Platelets: 206 10*3/uL (ref 150–400)
RBC: 4.68 MIL/uL (ref 4.22–5.81)
RDW: 13.5 % (ref 11.5–15.5)
WBC: 4.7 10*3/uL (ref 4.0–10.5)

## 2015-01-01 LAB — COMPLETE METABOLIC PANEL WITH GFR
ALT: 20 U/L (ref 0–53)
AST: 24 U/L (ref 0–37)
Albumin: 4.2 g/dL (ref 3.5–5.2)
Alkaline Phosphatase: 40 U/L (ref 39–117)
BUN: 14 mg/dL (ref 6–23)
CO2: 29 mEq/L (ref 19–32)
Calcium: 9.5 mg/dL (ref 8.4–10.5)
Chloride: 102 mEq/L (ref 96–112)
Creat: 1.09 mg/dL (ref 0.50–1.35)
GFR, Est African American: 76 mL/min
GFR, Est Non African American: 66 mL/min
Glucose, Bld: 149 mg/dL — ABNORMAL HIGH (ref 70–99)
Potassium: 4.8 mEq/L (ref 3.5–5.3)
Sodium: 140 mEq/L (ref 135–145)
Total Bilirubin: 0.5 mg/dL (ref 0.2–1.2)
Total Protein: 6.7 g/dL (ref 6.0–8.3)

## 2015-01-05 LAB — HEMOGLOBIN A1C
Hgb A1c MFr Bld: 7.1 % — ABNORMAL HIGH (ref <5.7)
Mean Plasma Glucose: 157 mg/dL — ABNORMAL HIGH (ref <117)

## 2015-01-08 ENCOUNTER — Other Ambulatory Visit: Payer: Self-pay | Admitting: Orthopedic Surgery

## 2015-01-11 ENCOUNTER — Encounter: Payer: Self-pay | Admitting: Family Medicine

## 2015-01-11 ENCOUNTER — Ambulatory Visit (INDEPENDENT_AMBULATORY_CARE_PROVIDER_SITE_OTHER): Payer: Medicare Other | Admitting: Family Medicine

## 2015-01-11 VITALS — BP 120/60 | HR 78 | Temp 97.8°F | Resp 16 | Ht 72.0 in | Wt 189.0 lb

## 2015-01-11 DIAGNOSIS — E119 Type 2 diabetes mellitus without complications: Secondary | ICD-10-CM
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
## 2015-01-12 ENCOUNTER — Other Ambulatory Visit: Payer: Medicare Other

## 2015-01-12 DIAGNOSIS — E119 Type 2 diabetes mellitus without complications: Secondary | ICD-10-CM | POA: Diagnosis not present

## 2015-01-12 LAB — LIPID PANEL
Cholesterol: 124 mg/dL (ref 0–200)
HDL: 42 mg/dL (ref >=40)
LDL Cholesterol: 71 mg/dL (ref 0–99)
Total CHOL/HDL Ratio: 3 Ratio
Triglycerides: 57 mg/dL (ref <150)
VLDL: 11 mg/dL (ref 0–40)

## 2015-01-14 ENCOUNTER — Other Ambulatory Visit: Payer: Self-pay | Admitting: Orthopedic Surgery

## 2015-01-14 DIAGNOSIS — Z01818 Encounter for other preprocedural examination: Secondary | ICD-10-CM

## 2015-01-15 ENCOUNTER — Encounter: Payer: Self-pay | Admitting: Family Medicine

## 2015-01-18 DIAGNOSIS — J019 Acute sinusitis, unspecified: Secondary | ICD-10-CM | POA: Diagnosis not present

## 2015-01-18 DIAGNOSIS — J029 Acute pharyngitis, unspecified: Secondary | ICD-10-CM | POA: Diagnosis not present

## 2015-01-20 ENCOUNTER — Other Ambulatory Visit: Payer: Medicare Other

## 2015-01-26 ENCOUNTER — Ambulatory Visit (INDEPENDENT_AMBULATORY_CARE_PROVIDER_SITE_OTHER): Payer: Medicare Other | Admitting: *Deleted

## 2015-01-26 ENCOUNTER — Encounter: Payer: Self-pay | Admitting: Internal Medicine

## 2015-01-26 ENCOUNTER — Encounter: Payer: Medicare Other | Attending: Family Medicine | Admitting: *Deleted

## 2015-01-26 ENCOUNTER — Ambulatory Visit
Admission: RE | Admit: 2015-01-26 | Discharge: 2015-01-26 | Disposition: A | Discharge status: Home / Self Care | Payer: Medicare Other | Source: Ambulatory Visit | Attending: Orthopedic Surgery | Admitting: Orthopedic Surgery

## 2015-01-26 ENCOUNTER — Encounter: Payer: Self-pay | Admitting: *Deleted

## 2015-01-26 VITALS — Ht 72.0 in | Wt 184.5 lb

## 2015-01-26 DIAGNOSIS — I442 Atrioventricular block, complete: Secondary | ICD-10-CM

## 2015-01-26 DIAGNOSIS — E119 Type 2 diabetes mellitus without complications: Secondary | ICD-10-CM | POA: Insufficient documentation

## 2015-01-26 DIAGNOSIS — Z01818 Encounter for other preprocedural examination: Secondary | ICD-10-CM

## 2015-01-26 DIAGNOSIS — Z713 Dietary counseling and surveillance: Secondary | ICD-10-CM | POA: Insufficient documentation

## 2015-01-26 DIAGNOSIS — M1712 Unilateral primary osteoarthritis, left knee: Secondary | ICD-10-CM | POA: Diagnosis not present

## 2015-01-26 DIAGNOSIS — M1612 Unilateral primary osteoarthritis, left hip: Secondary | ICD-10-CM | POA: Diagnosis not present

## 2015-01-26 DIAGNOSIS — M19072 Primary osteoarthritis, left ankle and foot: Secondary | ICD-10-CM | POA: Diagnosis not present

## 2015-01-26 NOTE — Patient Instructions (Signed)
Plan:  Aim for 2-3 Carb Choices per meal (30-45grams) +/- 1 either way  Aim for 0-15 Carbs per snack if hungry  Include protein in moderation with your meals and snacks Consider reading food labels for Total Carbohydrate and Fat Grams of foods Consider  increasing your activity level by walking for 30 minutes daily as tolerated Continue checking BG at alternate times per day as directed by MD  Continuetaking medication as directed by MD  ALWAYS EAT CARBOHYDRATES AND PROTEIN TOGETHER Stay away from the [place]  My Fitness Pal - app on telephone Subway sandwich stick with 6" bun or flat bread, can add additional meat and vegetables  Consider Raw vegetables for snacks

## 2015-01-26 NOTE — Progress Notes (Signed)
Diabetes Self-Management Education  Visit Type: First/Initial (DX 12/2014 - Pre 15-20 yrs)  Appt. Start Time: 1400 Appt. End Time: 7322  01/26/2015  [name], identified by name and date of birth, is a 74 y.o. male with a diagnosis of Diabetes: Type 2.  Other people present during visit:  Patient presents with his wife. He has a new diagnosis of T2DM. They are already making good food choices. Edgar Evans's greatest challenge is in the evening wanting a snack while watching television. His favorite is Teacher, English as a foreign language. He will try raw vegetables instead.  ASSESSMENT  Height 6' (1.829 m), weight 184 lb 8 oz (83.689 kg). Body mass index is 25.02 kg/(m^2).  Initial Visit Information:  Are you taking your medications as prescribed?: Yes Are you checking your feet?: No How often do you need to have someone help you when you read instructions, pamphlets, or other written materials from your doctor or pharmacy?: 1 - Never   Psychosocial:   Patient Belief/Attitude about Diabetes: Motivated to manage diabetes Self-care barriers: None Self-management support: Doctor's office, CDE visits Other persons present: Patient Patient Concerns: Nutrition/Meal planning, Glycemic Control Special Needs: None Preferred Learning Style: No preference indicated Learning Readiness: Change in progress  Complications:   Last HgB A1C per patient/outside source: 7.1 mg/dL How often do you check your blood sugar?: 1-2 times/day Fasting Blood glucose range (mg/dL): 70-129 (113-118) Have you had a dilated eye exam in the past 12 months?: No Have you had a dental exam in the past 12 months?: No  Diet Intake:  Breakfast: bowl of honey nut cheerios, milk skim, coffee black Lunch: naab crackers, cookies, sandwich chicken salade, ham/cheese, banana Dinner: chicken stir fry, chicken alfredo, salad with broiled chicken in it. Snack (evening): "Meat skin" Beverage(s): diet 7up, caffeine free diet pepsi,  coffee  Exercise:  Exercise: ADL's (very active in normal daily activities)  Individualized Plan for Diabetes Self-Management Training:   Learning Objective:  Patient will have a greater understanding of diabetes self-management. Patient education plan per assessed needs and concerns is to attend individual sessions      Education Topics Reviewed with Patient Today:  Definition of diabetes, type 1 and 2, and the diagnosis of diabetes, Factors that contribute to the development of diabetes, Explored patient's options for treatment of their diabetes Role of diet in the treatment of diabetes and the relationship between the three main macronutrients and blood glucose level, Food label reading, portion sizes and measuring food., Carbohydrate counting, Meal options for control of blood glucose level and chronic complications. Role of exercise on diabetes management, blood pressure control and cardiac health. Purpose and frequency of SMBG., Daily foot exams, Yearly dilated eye exam, Taught/discussed recording of test results and interpretation of SMBG. Relationship between chronic complications and blood glucose control, Assessed and discussed foot care and prevention of foot problems, Dental care   PATIENTS GOALS/Plan (Developed by the patient):  Nutrition: General guidelines for healthy choices and portions discussed Physical Activity: Exercise 3-5 times per week, 30 minutes per day Reducing Risk: do foot checks daily   Patient Instructions  Plan:  Aim for 2-3 Carb Choices per meal (30-45grams) +/- 1 either way  Aim for 0-15 Carbs per snack if hungry  Include protein in moderation with your meals and snacks Consider reading food labels for Total Carbohydrate and Fat Grams of foods Consider  increasing your activity level by walking for 30 minutes daily as tolerated Continue checking BG at alternate times per day as directed by  MD  Continuetaking medication as directed by MD  ALWAYS  EAT CARBOHYDRATES AND PROTEIN TOGETHER Stay away from the [place]  My Fitness Pal - app on telephone Subway sandwich stick with 6" bun or flat bread, can add additional meat and vegetables  Consider Raw vegetables for snacks   Expected Outcomes:  Demonstrated interest in learning. Expect positive outcomes  Education material provided: Living Well with Diabetes, A1C conversion sheet, Meal plan card, My Plate and Snack sheet  If problems or questions, patient to contact team via:  Phone  Future DSME appointment: PRN

## 2015-01-27 ENCOUNTER — Telehealth: Payer: Self-pay | Admitting: Internal Medicine

## 2015-01-27 NOTE — Telephone Encounter (Signed)
Informed pt that it he can leave home monitor at home. Pt verbalized understanding but stated that he had already packed it up to take w/ him to the beach. Informed pt that this was ok to.

## 2015-01-27 NOTE — Telephone Encounter (Signed)
New Message       Pt calling stating that he is going to the beach for 1 week and wants to know if he needs to take his monitor with him. Please call back and advise.

## 2015-01-27 NOTE — Progress Notes (Signed)
Remote pacemaker transmission.   

## 2015-02-03 LAB — CUP PACEART REMOTE DEVICE CHECK
Battery Remaining Longevity: 122 mo
Battery Remaining Percentage: 95.5 %
Battery Voltage: 3.02 V
Brady Statistic AP VP Percent: 1 %
Brady Statistic AP VS Percent: 9.3 %
Brady Statistic AS VP Percent: 1 %
Brady Statistic AS VS Percent: 91 %
Brady Statistic RA Percent Paced: 9 %
Brady Statistic RV Percent Paced: 1 %
Date Time Interrogation Session: 20160531062917
Lead Channel Impedance Value: 510 Ohm
Lead Channel Impedance Value: 710 Ohm
Lead Channel Pacing Threshold Amplitude: 0.5 V
Lead Channel Pacing Threshold Amplitude: 0.75 V
Lead Channel Pacing Threshold Pulse Width: 0.5 ms
Lead Channel Pacing Threshold Pulse Width: 0.5 ms
Lead Channel Sensing Intrinsic Amplitude: 12 mV
Lead Channel Sensing Intrinsic Amplitude: 5 mV
Lead Channel Setting Pacing Amplitude: 2 V
Lead Channel Setting Pacing Amplitude: 2.5 V
Lead Channel Setting Pacing Pulse Width: 0.5 ms
Lead Channel Setting Sensing Sensitivity: 2 mV
Pulse Gen Model: 2240
Pulse Gen Serial Number: 7618665

## 2015-02-04 ENCOUNTER — Ambulatory Visit (HOSPITAL_COMMUNITY)
Admission: RE | Admit: 2015-02-04 | Discharge: 2015-02-04 | Disposition: A | Discharge status: Home / Self Care | Payer: Medicare Other | Source: Ambulatory Visit | Attending: Orthopedic Surgery | Admitting: Orthopedic Surgery

## 2015-02-04 ENCOUNTER — Encounter (HOSPITAL_COMMUNITY)
Admission: RE | Admit: 2015-02-04 | Discharge: 2015-02-04 | Disposition: A | Discharge status: Home / Self Care | Payer: Medicare Other | Source: Ambulatory Visit | Attending: Orthopedic Surgery | Admitting: Orthopedic Surgery

## 2015-02-04 ENCOUNTER — Encounter (HOSPITAL_COMMUNITY): Payer: Self-pay

## 2015-02-04 DIAGNOSIS — Z01818 Encounter for other preprocedural examination: Secondary | ICD-10-CM | POA: Insufficient documentation

## 2015-02-04 DIAGNOSIS — Z950 Presence of cardiac pacemaker: Secondary | ICD-10-CM | POA: Insufficient documentation

## 2015-02-04 DIAGNOSIS — M1712 Unilateral primary osteoarthritis, left knee: Secondary | ICD-10-CM | POA: Insufficient documentation

## 2015-02-04 DIAGNOSIS — E119 Type 2 diabetes mellitus without complications: Secondary | ICD-10-CM | POA: Insufficient documentation

## 2015-02-04 DIAGNOSIS — R9431 Abnormal electrocardiogram [ECG] [EKG]: Secondary | ICD-10-CM | POA: Insufficient documentation

## 2015-02-04 DIAGNOSIS — Z01812 Encounter for preprocedural laboratory examination: Secondary | ICD-10-CM | POA: Insufficient documentation

## 2015-02-04 DIAGNOSIS — I10 Essential (primary) hypertension: Secondary | ICD-10-CM | POA: Insufficient documentation

## 2015-02-04 HISTORY — DX: Unspecified osteoarthritis, unspecified site: M19.90

## 2015-02-04 HISTORY — DX: Headache: R51

## 2015-02-04 HISTORY — DX: Presence of cardiac pacemaker: Z95.0

## 2015-02-04 HISTORY — DX: Headache, unspecified: R51.9

## 2015-02-04 LAB — URINALYSIS, ROUTINE W REFLEX MICROSCOPIC
Bilirubin Urine: NEGATIVE
Glucose, UA: NEGATIVE mg/dL
Ketones, ur: NEGATIVE mg/dL
Leukocytes, UA: NEGATIVE
Nitrite: NEGATIVE
Protein, ur: NEGATIVE mg/dL
Specific Gravity, Urine: 1.023 (ref 1.005–1.030)
Urobilinogen, UA: 1 mg/dL (ref 0.0–1.0)
pH: 5.5 (ref 5.0–8.0)

## 2015-02-04 LAB — CBC WITH DIFFERENTIAL/PLATELET
Basophils Absolute: 0 10*3/uL (ref 0.0–0.1)
Basophils Relative: 1 % (ref 0–1)
Eosinophils Absolute: 0.2 10*3/uL (ref 0.0–0.7)
Eosinophils Relative: 3 % (ref 0–5)
HCT: 39.7 % (ref 39.0–52.0)
Hemoglobin: 13.3 g/dL (ref 13.0–17.0)
Lymphocytes Relative: 29 % (ref 12–46)
Lymphs Abs: 1.5 10*3/uL (ref 0.7–4.0)
MCH: 29.4 pg (ref 26.0–34.0)
MCHC: 33.5 g/dL (ref 30.0–36.0)
MCV: 87.6 fL (ref 78.0–100.0)
Monocytes Absolute: 0.2 10*3/uL (ref 0.1–1.0)
Monocytes Relative: 4 % (ref 3–12)
Neutro Abs: 3.3 10*3/uL (ref 1.7–7.7)
Neutrophils Relative %: 63 % (ref 43–77)
Platelets: 203 10*3/uL (ref 150–400)
RBC: 4.53 MIL/uL (ref 4.22–5.81)
RDW: 12.8 % (ref 11.5–15.5)
WBC: 5.3 10*3/uL (ref 4.0–10.5)

## 2015-02-04 LAB — PROTIME-INR
INR: 1.1 (ref 0.00–1.49)
Prothrombin Time: 14.4 seconds (ref 11.6–15.2)

## 2015-02-04 LAB — COMPREHENSIVE METABOLIC PANEL
ALT: 20 U/L (ref 17–63)
AST: 23 U/L (ref 15–41)
Albumin: 4 g/dL (ref 3.5–5.0)
Alkaline Phosphatase: 38 U/L (ref 38–126)
Anion gap: 6 (ref 5–15)
BUN: 18 mg/dL (ref 6–20)
CO2: 29 mmol/L (ref 22–32)
Calcium: 9.4 mg/dL (ref 8.9–10.3)
Chloride: 105 mmol/L (ref 101–111)
Creatinine, Ser: 1.12 mg/dL (ref 0.61–1.24)
GFR calc Af Amer: >60 mL/min (ref >60)
GFR calc non Af Amer: >60 mL/min (ref >60)
Glucose, Bld: 126 mg/dL — ABNORMAL HIGH (ref 65–99)
Potassium: 4 mmol/L (ref 3.5–5.1)
Sodium: 140 mmol/L (ref 135–145)
Total Bilirubin: 0.6 mg/dL (ref 0.3–1.2)
Total Protein: 6.3 g/dL — ABNORMAL LOW (ref 6.5–8.1)

## 2015-02-04 LAB — APTT: aPTT: 33 seconds (ref 24–37)

## 2015-02-04 LAB — URINE MICROSCOPIC-ADD ON

## 2015-02-04 LAB — SURGICAL PCR SCREEN
MRSA, PCR: NEGATIVE
Staphylococcus aureus: NEGATIVE

## 2015-02-04 LAB — GLUCOSE, CAPILLARY: Glucose-Capillary: 162 mg/dL — ABNORMAL HIGH (ref 65–99)

## 2015-02-04 NOTE — Progress Notes (Addendum)
PCP is Dr Jenna Luo Last hgba1c was done 12-31-14 (in epic)-he is newly dx with diabetes  Reports his fasting cbg's run 118-130's Pt reports he had a heart cath in 2000, and everything was good Pacemaker noted [facility] Dr Rayann Heman takes care of his pacemaker

## 2015-02-04 NOTE — Pre-Procedure Instructions (Signed)
KUTTER SCHNEPF  02/04/2015      [id number] - [address], [place] [address] [place] [address] [place] [address] Phone: [phone] Fax: [phone]    Your procedure is scheduled on June 20  Report to [facility] at 730 A.M.  Call this number if you have problems the morning of surgery:  [phone]   Remember:  Do not eat food or drink liquids after midnight.  Take these medicines the morning of surgery with A SIP OF WATER : certrizine (Zyrtec)  Stop taking Aspirin, Aleve, Ibuprofen, BC's, Goody's, Herbal medications, Fish Oil   Do not wear jewelry, make-up or nail polish.  Do not wear lotions, powders, or perfumes.  You may wear deodorant.  Do not shave 48 hours prior to surgery.  Men may shave face and neck.  Do not bring valuables to the hospital.  [facility] is not responsible for any belongings or valuables.  Contacts, dentures or bridgework may not be worn into surgery.  Leave your suitcase in the car.  After surgery it may be brought to your room.  For patients admitted to the hospital, discharge time will be determined by your treatment team.  Patients discharged the day of surgery will not be allowed to drive home.    Special instructions:  [facility] - Preparing for Surgery  Before surgery, you can play an important role.  Because skin is not sterile, your skin needs to be as free of germs as possible.  You can reduce the number of germs on you skin by washing with CHG (chlorahexidine gluconate) soap before surgery.  CHG is an antiseptic cleaner which kills germs and bonds with the skin to continue killing germs even after washing.  Please DO NOT use if you have an allergy to CHG or antibacterial soaps.  If your skin becomes reddened/irritated stop using the CHG and inform your nurse when you arrive at Short Stay.  Do not shave (including legs and underarms) for at  least 48 hours prior to the first CHG shower.  You may shave your face.  Please follow these instructions carefully:   1.  Shower with CHG Soap the night before surgery and the  morning of Surgery.  2.  If you choose to wash your hair, wash your hair first as usual with your   normal shampoo.  3.  After you shampoo, rinse your hair and body thoroughly to remove the  Shampoo.  4.  Use CHG as you would any other liquid soap.  You can apply chg directly to the skin and wash gently with scrungie or a clean washcloth.  5.  Apply the CHG Soap to your body ONLY FROM THE NECK DOWN.  Do not use on open wounds or open sores.  Avoid contact with your eyes,  ears, mouth and genitals (private parts).  Wash genitals (private parts) with your normal soap.  6.  Wash thoroughly, paying special attention to the area where your surgery will be performed.  7.  Thoroughly rinse your body with warm water from the neck down.  8.  DO NOT shower/wash with your normal soap after using and rinsing off  the CHG Soap.  9.  Pat yourself dry with a clean towel.            10.  Wear clean pajamas.  11.  Place clean sheets on your bed the night of your first shower and do not sleep with pets.  Day of Surgery  Do not apply any lotions/deoderants the morning of surgery.  Please wear clean clothes to the hospital/surgery center.     Please read over the following fact sheets that you were given. Pain Booklet, Coughing and Deep Breathing, MRSA Information and Surgical Site Infection Prevention

## 2015-02-05 DIAGNOSIS — M1712 Unilateral primary osteoarthritis, left knee: Secondary | ICD-10-CM | POA: Diagnosis not present

## 2015-02-05 LAB — URINE CULTURE
Colony Count: NO GROWTH
Culture: NO GROWTH

## 2015-02-15 ENCOUNTER — Encounter (HOSPITAL_COMMUNITY): Payer: Self-pay | Admitting: *Deleted

## 2015-02-15 ENCOUNTER — Inpatient Hospital Stay (HOSPITAL_COMMUNITY): Payer: Medicare Other | Admitting: Anesthesiology

## 2015-02-15 ENCOUNTER — Encounter: Payer: Self-pay | Admitting: Cardiology

## 2015-02-15 ENCOUNTER — Inpatient Hospital Stay (HOSPITAL_COMMUNITY)
Admission: RE | Admit: 2015-02-15 | Discharge: 2015-02-16 | DRG: 470 | Disposition: A | Discharge status: Home Health | Payer: Medicare Other | Source: Ambulatory Visit | Attending: Orthopedic Surgery | Admitting: Orthopedic Surgery

## 2015-02-15 ENCOUNTER — Encounter (HOSPITAL_COMMUNITY)
Admission: RE | Disposition: A | Discharge status: Home Health | Payer: Self-pay | Source: Ambulatory Visit | Attending: Orthopedic Surgery

## 2015-02-15 DIAGNOSIS — Z880 Allergy status to penicillin: Secondary | ICD-10-CM | POA: Diagnosis not present

## 2015-02-15 DIAGNOSIS — Z950 Presence of cardiac pacemaker: Secondary | ICD-10-CM | POA: Diagnosis not present

## 2015-02-15 DIAGNOSIS — Z888 Allergy status to other drugs, medicaments and biological substances status: Secondary | ICD-10-CM | POA: Diagnosis not present

## 2015-02-15 DIAGNOSIS — E119 Type 2 diabetes mellitus without complications: Secondary | ICD-10-CM | POA: Diagnosis not present

## 2015-02-15 DIAGNOSIS — G8918 Other acute postprocedural pain: Secondary | ICD-10-CM | POA: Diagnosis not present

## 2015-02-15 DIAGNOSIS — Z87891 Personal history of nicotine dependence: Secondary | ICD-10-CM

## 2015-02-15 DIAGNOSIS — M1712 Unilateral primary osteoarthritis, left knee: Secondary | ICD-10-CM | POA: Diagnosis not present

## 2015-02-15 DIAGNOSIS — N400 Enlarged prostate without lower urinary tract symptoms: Secondary | ICD-10-CM | POA: Diagnosis present

## 2015-02-15 DIAGNOSIS — I10 Essential (primary) hypertension: Secondary | ICD-10-CM | POA: Diagnosis present

## 2015-02-15 DIAGNOSIS — M179 Osteoarthritis of knee, unspecified: Secondary | ICD-10-CM | POA: Diagnosis present

## 2015-02-15 DIAGNOSIS — M25562 Pain in left knee: Secondary | ICD-10-CM | POA: Diagnosis not present

## 2015-02-15 DIAGNOSIS — M1710 Unilateral primary osteoarthritis, unspecified knee: Secondary | ICD-10-CM | POA: Diagnosis present

## 2015-02-15 DIAGNOSIS — K219 Gastro-esophageal reflux disease without esophagitis: Secondary | ICD-10-CM | POA: Diagnosis not present

## 2015-02-15 HISTORY — PX: TOTAL KNEE ARTHROPLASTY: SHX125

## 2015-02-15 LAB — GLUCOSE, CAPILLARY
Glucose-Capillary: 124 mg/dL — ABNORMAL HIGH (ref 65–99)
Glucose-Capillary: 126 mg/dL — ABNORMAL HIGH (ref 65–99)
Glucose-Capillary: 162 mg/dL — ABNORMAL HIGH (ref 65–99)

## 2015-02-15 LAB — CBC
HCT: 38.1 % — ABNORMAL LOW (ref 39.0–52.0)
Hemoglobin: 12.7 g/dL — ABNORMAL LOW (ref 13.0–17.0)
MCH: 29.2 pg (ref 26.0–34.0)
MCHC: 33.3 g/dL (ref 30.0–36.0)
MCV: 87.6 fL (ref 78.0–100.0)
Platelets: 138 10*3/uL — ABNORMAL LOW (ref 150–400)
RBC: 4.35 MIL/uL (ref 4.22–5.81)
RDW: 12.7 % (ref 11.5–15.5)
WBC: 5.9 10*3/uL (ref 4.0–10.5)

## 2015-02-15 LAB — CREATININE, SERUM
Creatinine, Ser: 0.85 mg/dL (ref 0.61–1.24)
GFR calc Af Amer: >60 mL/min (ref >60)
GFR calc non Af Amer: >60 mL/min (ref >60)

## 2015-02-15 SURGERY — ARTHROPLASTY, KNEE, TOTAL
Anesthesia: Regional | Site: Knee | Laterality: Left

## 2015-02-15 MED ORDER — FENTANYL CITRATE (PF) 100 MCG/2ML IJ SOLN
INTRAMUSCULAR | Status: AC
  Administered 2015-02-15: 50 ug
  Filled 2015-02-15: qty 2

## 2015-02-15 MED ORDER — ROCURONIUM BROMIDE 50 MG/5ML IV SOLN
INTRAVENOUS | Status: AC
  Filled 2015-02-15: qty 1

## 2015-02-15 MED ORDER — OXYCODONE HCL 5 MG PO TABS
5.0000 mg | ORAL_TABLET | ORAL | Status: DC | PRN
  Administered 2015-02-15: 5 mg via ORAL
  Administered 2015-02-15 – 2015-02-16 (×5): 10 mg via ORAL
  Filled 2015-02-15 (×5): qty 2

## 2015-02-15 MED ORDER — PHENOL 1.4 % MT LIQD
1.0000 | OROMUCOSAL | Status: DC | PRN
Start: 2015-02-15 — End: 2015-02-16

## 2015-02-15 MED ORDER — METHOCARBAMOL 500 MG PO TABS
500.0000 mg | ORAL_TABLET | Freq: Four times a day (QID) | ORAL | Status: DC | PRN
  Administered 2015-02-16 (×2): 500 mg via ORAL
  Filled 2015-02-15 (×3): qty 1

## 2015-02-15 MED ORDER — OXYCODONE HCL ER 10 MG PO T12A
10.0000 mg | EXTENDED_RELEASE_TABLET | Freq: Two times a day (BID) | ORAL | Status: DC
  Administered 2015-02-15 – 2015-02-16 (×2): 10 mg via ORAL
  Filled 2015-02-15 (×3): qty 1

## 2015-02-15 MED ORDER — METHOCARBAMOL 1000 MG/10ML IJ SOLN
500.0000 mg | INTRAMUSCULAR | Status: AC
  Administered 2015-02-15: 500 mg via INTRAVENOUS
  Filled 2015-02-15: qty 5

## 2015-02-15 MED ORDER — VANCOMYCIN HCL IN DEXTROSE 1-5 GM/200ML-% IV SOLN
1000.0000 mg | INTRAVENOUS | Status: AC
  Administered 2015-02-15: 1000 mg via INTRAVENOUS

## 2015-02-15 MED ORDER — HYDROMORPHONE HCL 1 MG/ML IJ SOLN
INTRAMUSCULAR | Status: AC
  Filled 2015-02-15: qty 1

## 2015-02-15 MED ORDER — 0.9 % SODIUM CHLORIDE (POUR BTL) OPTIME
TOPICAL | Status: DC | PRN
  Administered 2015-02-15: 1000 mL

## 2015-02-15 MED ORDER — FENTANYL CITRATE (PF) 250 MCG/5ML IJ SOLN
INTRAMUSCULAR | Status: AC
  Filled 2015-02-15: qty 5

## 2015-02-15 MED ORDER — EPHEDRINE SULFATE 50 MG/ML IJ SOLN
INTRAMUSCULAR | Status: AC
  Filled 2015-02-15: qty 1

## 2015-02-15 MED ORDER — ACETAMINOPHEN 10 MG/ML IV SOLN
INTRAVENOUS | Status: DC | PRN
  Administered 2015-02-15: 1000 mg via INTRAVENOUS

## 2015-02-15 MED ORDER — CELECOXIB 200 MG PO CAPS
200.0000 mg | ORAL_CAPSULE | Freq: Two times a day (BID) | ORAL | Status: DC
  Administered 2015-02-15 – 2015-02-16 (×2): 200 mg via ORAL
  Filled 2015-02-15 (×3): qty 1

## 2015-02-15 MED ORDER — ONDANSETRON HCL 4 MG/2ML IJ SOLN
INTRAMUSCULAR | Status: AC
  Filled 2015-02-15: qty 4

## 2015-02-15 MED ORDER — LIDOCAINE HCL (CARDIAC) 20 MG/ML IV SOLN
INTRAVENOUS | Status: DC | PRN
  Administered 2015-02-15: 40 mg via INTRAVENOUS

## 2015-02-15 MED ORDER — ACETAMINOPHEN 325 MG PO TABS: 650.0000 mg | ORAL_TABLET | Freq: Four times a day (QID) | ORAL | Status: DC | PRN

## 2015-02-15 MED ORDER — HYDROMORPHONE HCL 1 MG/ML IJ SOLN
0.2500 mg | INTRAMUSCULAR | Status: DC | PRN
  Administered 2015-02-15 (×3): 0.5 mg via INTRAVENOUS

## 2015-02-15 MED ORDER — MENTHOL 3 MG MT LOZG: 1.0000 | LOZENGE | OROMUCOSAL | Status: DC | PRN

## 2015-02-15 MED ORDER — METOCLOPRAMIDE HCL 5 MG PO TABS: 5.0000 mg | ORAL_TABLET | Freq: Three times a day (TID) | ORAL | Status: DC | PRN

## 2015-02-15 MED ORDER — BUPIVACAINE LIPOSOME 1.3 % IJ SUSP
INTRAMUSCULAR | Status: DC | PRN
  Administered 2015-02-15: 20 mL

## 2015-02-15 MED ORDER — BUPIVACAINE LIPOSOME 1.3 % IJ SUSP
20.0000 mL | Freq: Once | INTRAMUSCULAR | Status: DC
  Filled 2015-02-15: qty 20

## 2015-02-15 MED ORDER — POLYETHYLENE GLYCOL 3350 17 G PO PACK: 17.0000 g | PACK | Freq: Every day | ORAL | Status: DC | PRN

## 2015-02-15 MED ORDER — VANCOMYCIN HCL IN DEXTROSE 1-5 GM/200ML-% IV SOLN
1000.0000 mg | Freq: Two times a day (BID) | INTRAVENOUS | Status: AC
  Administered 2015-02-15: 1000 mg via INTRAVENOUS
  Filled 2015-02-15 (×3): qty 200

## 2015-02-15 MED ORDER — ACETAMINOPHEN 10 MG/ML IV SOLN
INTRAVENOUS | Status: AC
  Filled 2015-02-15: qty 100

## 2015-02-15 MED ORDER — DEXAMETHASONE SODIUM PHOSPHATE 10 MG/ML IJ SOLN
INTRAMUSCULAR | Status: DC | PRN
  Administered 2015-02-15: 10 mg via INTRAVENOUS

## 2015-02-15 MED ORDER — HYDROMORPHONE HCL 1 MG/ML IJ SOLN
1.0000 mg | INTRAMUSCULAR | Status: DC | PRN
  Administered 2015-02-15: 1 mg via INTRAVENOUS
  Filled 2015-02-15: qty 1

## 2015-02-15 MED ORDER — VANCOMYCIN HCL IN DEXTROSE 1-5 GM/200ML-% IV SOLN
INTRAVENOUS | Status: AC
  Filled 2015-02-15: qty 200

## 2015-02-15 MED ORDER — ONDANSETRON HCL 4 MG/2ML IJ SOLN
4.0000 mg | Freq: Four times a day (QID) | INTRAMUSCULAR | Status: DC | PRN
  Administered 2015-02-15: 4 mg via INTRAVENOUS
  Filled 2015-02-15: qty 2

## 2015-02-15 MED ORDER — PROPOFOL 10 MG/ML IV BOLUS
INTRAVENOUS | Status: DC | PRN
  Administered 2015-02-15: 150 mg via INTRAVENOUS

## 2015-02-15 MED ORDER — BUPIVACAINE-EPINEPHRINE 0.5% -1:200000 IJ SOLN
INTRAMUSCULAR | Status: DC | PRN
  Administered 2015-02-15: 30 mL

## 2015-02-15 MED ORDER — FENTANYL CITRATE (PF) 100 MCG/2ML IJ SOLN
INTRAMUSCULAR | Status: DC | PRN
  Administered 2015-02-15: 25 ug via INTRAVENOUS
  Administered 2015-02-15: 50 ug via INTRAVENOUS
  Administered 2015-02-15 (×3): 25 ug via INTRAVENOUS
  Administered 2015-02-15 (×2): 50 ug via INTRAVENOUS

## 2015-02-15 MED ORDER — ALUM & MAG HYDROXIDE-SIMETH 200-200-20 MG/5ML PO SUSP: 30.0000 mL | ORAL | Status: DC | PRN

## 2015-02-15 MED ORDER — PHENYLEPHRINE HCL 10 MG/ML IJ SOLN
INTRAMUSCULAR | Status: DC | PRN
  Administered 2015-02-15: 80 ug via INTRAVENOUS
  Administered 2015-02-15: 40 ug via INTRAVENOUS
  Administered 2015-02-15: 80 ug via INTRAVENOUS
  Administered 2015-02-15: 40 ug via INTRAVENOUS

## 2015-02-15 MED ORDER — MIDAZOLAM HCL 2 MG/2ML IJ SOLN
INTRAMUSCULAR | Status: AC
  Filled 2015-02-15: qty 2

## 2015-02-15 MED ORDER — MAGNESIUM CITRATE PO SOLN: 1.0000 | Freq: Once | ORAL | Status: AC | PRN

## 2015-02-15 MED ORDER — ROPIVACAINE HCL 5 MG/ML IJ SOLN
INTRAMUSCULAR | Status: DC | PRN
  Administered 2015-02-15: 25 mL via EPIDURAL

## 2015-02-15 MED ORDER — ACETAMINOPHEN 650 MG RE SUPP: 650.0000 mg | Freq: Four times a day (QID) | RECTAL | Status: DC | PRN

## 2015-02-15 MED ORDER — ZOLPIDEM TARTRATE 5 MG PO TABS: 5.0000 mg | ORAL_TABLET | Freq: Every evening | ORAL | Status: DC | PRN

## 2015-02-15 MED ORDER — SODIUM CHLORIDE 0.9 % IV SOLN: INTRAVENOUS | Status: DC

## 2015-02-15 MED ORDER — LACTATED RINGERS IV SOLN
INTRAVENOUS | Status: DC
  Administered 2015-02-15: 08:00:00 via INTRAVENOUS

## 2015-02-15 MED ORDER — METOCLOPRAMIDE HCL 5 MG/ML IJ SOLN: 5.0000 mg | Freq: Three times a day (TID) | INTRAMUSCULAR | Status: DC | PRN

## 2015-02-15 MED ORDER — LISINOPRIL 5 MG PO TABS
5.0000 mg | ORAL_TABLET | Freq: Every day | ORAL | Status: DC
  Administered 2015-02-15 – 2015-02-16 (×2): 5 mg via ORAL
  Filled 2015-02-15 (×3): qty 1

## 2015-02-15 MED ORDER — LIDOCAINE HCL (CARDIAC) 20 MG/ML IV SOLN
INTRAVENOUS | Status: AC
  Filled 2015-02-15: qty 5

## 2015-02-15 MED ORDER — OXYCODONE HCL 5 MG PO TABS
ORAL_TABLET | ORAL | Status: AC
  Filled 2015-02-15: qty 1

## 2015-02-15 MED ORDER — ONDANSETRON HCL 4 MG/2ML IJ SOLN
INTRAMUSCULAR | Status: DC | PRN
  Administered 2015-02-15: 4 mg via INTRAVENOUS

## 2015-02-15 MED ORDER — SODIUM CHLORIDE 0.9 % IJ SOLN
INTRAMUSCULAR | Status: AC
  Filled 2015-02-15: qty 10

## 2015-02-15 MED ORDER — SIMVASTATIN 40 MG PO TABS
40.0000 mg | ORAL_TABLET | Freq: Every day | ORAL | Status: DC
  Administered 2015-02-15: 40 mg via ORAL
  Filled 2015-02-15: qty 1

## 2015-02-15 MED ORDER — DIPHENHYDRAMINE HCL 12.5 MG/5ML PO ELIX: 12.5000 mg | ORAL_SOLUTION | ORAL | Status: DC | PRN

## 2015-02-15 MED ORDER — DOCUSATE SODIUM 100 MG PO CAPS
100.0000 mg | ORAL_CAPSULE | Freq: Two times a day (BID) | ORAL | Status: DC
  Administered 2015-02-15 – 2015-02-16 (×2): 100 mg via ORAL
  Filled 2015-02-15 (×3): qty 1

## 2015-02-15 MED ORDER — ENOXAPARIN SODIUM 30 MG/0.3ML ~~LOC~~ SOLN
30.0000 mg | Freq: Two times a day (BID) | SUBCUTANEOUS | Status: DC
  Administered 2015-02-16: 30 mg via SUBCUTANEOUS
  Filled 2015-02-15: qty 0.3

## 2015-02-15 MED ORDER — TRANEXAMIC ACID 1000 MG/10ML IV SOLN: 1000.0000 mg | INTRAVENOUS | Status: DC

## 2015-02-15 MED ORDER — MIDAZOLAM HCL 2 MG/2ML IJ SOLN
INTRAMUSCULAR | Status: AC
  Administered 2015-02-15: 1 mg
  Filled 2015-02-15: qty 2

## 2015-02-15 MED ORDER — PROPOFOL 10 MG/ML IV BOLUS
INTRAVENOUS | Status: AC
  Filled 2015-02-15: qty 20

## 2015-02-15 MED ORDER — ONDANSETRON HCL 4 MG PO TABS
4.0000 mg | ORAL_TABLET | Freq: Four times a day (QID) | ORAL | Status: DC | PRN
  Administered 2015-02-16: 4 mg via ORAL
  Filled 2015-02-15: qty 1

## 2015-02-15 MED ORDER — SODIUM CHLORIDE 0.9 % IR SOLN
Status: DC | PRN
  Administered 2015-02-15: 3000 mL

## 2015-02-15 MED ORDER — TRANEXAMIC ACID 1000 MG/10ML IV SOLN
1000.0000 mg | INTRAVENOUS | Status: AC
  Administered 2015-02-15: 1000 mg via INTRAVENOUS
  Filled 2015-02-15: qty 10

## 2015-02-15 MED ORDER — CHLORHEXIDINE GLUCONATE 4 % EX LIQD: 60.0000 mL | Freq: Once | CUTANEOUS | Status: DC

## 2015-02-15 MED ORDER — LORATADINE 10 MG PO TABS
10.0000 mg | ORAL_TABLET | Freq: Every day | ORAL | Status: DC
  Administered 2015-02-15 – 2015-02-16 (×2): 10 mg via ORAL
  Filled 2015-02-15 (×3): qty 1

## 2015-02-15 MED ORDER — PHENYLEPHRINE 40 MCG/ML (10ML) SYRINGE FOR IV PUSH (FOR BLOOD PRESSURE SUPPORT)
PREFILLED_SYRINGE | INTRAVENOUS | Status: AC
  Filled 2015-02-15: qty 10

## 2015-02-15 MED ORDER — SUCCINYLCHOLINE CHLORIDE 20 MG/ML IJ SOLN
INTRAMUSCULAR | Status: AC
  Filled 2015-02-15: qty 1

## 2015-02-15 MED ORDER — EPHEDRINE SULFATE 50 MG/ML IJ SOLN
INTRAMUSCULAR | Status: DC | PRN
  Administered 2015-02-15: 10 mg via INTRAVENOUS

## 2015-02-15 MED ORDER — METHOCARBAMOL 1000 MG/10ML IJ SOLN
500.0000 mg | Freq: Four times a day (QID) | INTRAVENOUS | Status: DC | PRN
  Filled 2015-02-15: qty 5

## 2015-02-15 MED ORDER — LACTATED RINGERS IV SOLN
INTRAVENOUS | Status: DC | PRN
  Administered 2015-02-15 (×2): via INTRAVENOUS

## 2015-02-15 MED ORDER — BISACODYL 10 MG RE SUPP: 10.0000 mg | Freq: Every day | RECTAL | Status: DC | PRN

## 2015-02-15 SURGICAL SUPPLY — 58 items
BANDAGE ESMARK 6X9 LF (GAUZE/BANDAGES/DRESSINGS) ×1 IMPLANT
BLADE SAGITTAL 13X1.27X60 (BLADE) ×2 IMPLANT
BLADE SAW SGTL 83.5X18.5 (BLADE) ×3 IMPLANT
BLADE SURG 10 STRL SS (BLADE) ×2 IMPLANT
BLOCK CUTTING TIBIAL 4 MED (MISCELLANEOUS) ×2 IMPLANT
BNDG CMPR 9X6 STRL LF SNTH (GAUZE/BANDAGES/DRESSINGS) ×1
BNDG ESMARK 6X9 LF (GAUZE/BANDAGES/DRESSINGS) ×2
BOWL SMART MIX CTS (DISPOSABLE) ×2 IMPLANT
CAPT KNEE TOTAL 3 ×1 IMPLANT
CEMENT BONE SIMPLEX SPEEDSET (Cement) ×4 IMPLANT
COVER SURGICAL LIGHT HANDLE (MISCELLANEOUS) ×2 IMPLANT
CUFF TOURNIQUET SINGLE 34IN LL (TOURNIQUET CUFF) ×2 IMPLANT
DRAPE EXTREMITY T 121X128X90 (DRAPE) ×2 IMPLANT
DRAPE IMP U-DRAPE 54X76 (DRAPES) ×2 IMPLANT
DRAPE INCISE IOBAN 66X45 STRL (DRAPES) ×4 IMPLANT
DRAPE PROXIMA HALF (DRAPES) IMPLANT
DRAPE U-SHAPE 47X51 STRL (DRAPES) ×2 IMPLANT
DRSG ADAPTIC 3X8 NADH LF (GAUZE/BANDAGES/DRESSINGS) ×2 IMPLANT
DRSG PAD ABDOMINAL 8X10 ST (GAUZE/BANDAGES/DRESSINGS) ×2 IMPLANT
DURAPREP 26ML APPLICATOR (WOUND CARE) ×4 IMPLANT
ELECT REM PT RETURN 9FT ADLT (ELECTROSURGICAL) ×2
ELECTRODE REM PT RTRN 9FT ADLT (ELECTROSURGICAL) ×1 IMPLANT
GAUZE SPONGE 4X4 12PLY STRL (GAUZE/BANDAGES/DRESSINGS) ×2 IMPLANT
GLOVE BIOGEL M 7.0 STRL (GLOVE) IMPLANT
GLOVE BIOGEL PI IND STRL 7.5 (GLOVE) IMPLANT
GLOVE BIOGEL PI IND STRL 8.5 (GLOVE) ×2 IMPLANT
GLOVE BIOGEL PI INDICATOR 7.5 (GLOVE)
GLOVE BIOGEL PI INDICATOR 8.5 (GLOVE) ×2
GLOVE SURG ORTHO 8.0 STRL STRW (GLOVE) ×4 IMPLANT
GOWN STRL REUS W/ TWL LRG LVL3 (GOWN DISPOSABLE) ×1 IMPLANT
GOWN STRL REUS W/ TWL XL LVL3 (GOWN DISPOSABLE) ×2 IMPLANT
GOWN STRL REUS W/TWL LRG LVL3 (GOWN DISPOSABLE) ×2
GOWN STRL REUS W/TWL XL LVL3 (GOWN DISPOSABLE) ×4
HANDPIECE INTERPULSE COAX TIP (DISPOSABLE) ×2
HOOD PEEL AWAY FACE SHEILD DIS (HOOD) ×6 IMPLANT
KIT BASIN OR (CUSTOM PROCEDURE TRAY) ×2 IMPLANT
KIT ROOM TURNOVER OR (KITS) ×2 IMPLANT
MANIFOLD NEPTUNE II (INSTRUMENTS) ×2 IMPLANT
NEEDLE 22X1 1/2 (OR ONLY) (NEEDLE) ×4 IMPLANT
NS IRRIG 1000ML POUR BTL (IV SOLUTION) ×2 IMPLANT
PACK TOTAL JOINT (CUSTOM PROCEDURE TRAY) ×2 IMPLANT
PACK UNIVERSAL I (CUSTOM PROCEDURE TRAY) ×2 IMPLANT
PAD ARMBOARD 7.5X6 YLW CONV (MISCELLANEOUS) ×4 IMPLANT
PADDING CAST COTTON 6X4 STRL (CAST SUPPLIES) ×2 IMPLANT
SET HNDPC FAN SPRY TIP SCT (DISPOSABLE) ×1 IMPLANT
SPONGE GAUZE 4X4 12PLY STER LF (GAUZE/BANDAGES/DRESSINGS) ×1 IMPLANT
STAPLER VISISTAT 35W (STAPLE) ×2 IMPLANT
SUCTION FRAZIER TIP 10 FR DISP (SUCTIONS) ×2 IMPLANT
SUT BONE WAX W31G (SUTURE) ×2 IMPLANT
SUT VIC AB 0 CTB1 27 (SUTURE) ×4 IMPLANT
SUT VIC AB 1 CT1 27 (SUTURE) ×4
SUT VIC AB 1 CT1 27XBRD ANBCTR (SUTURE) ×2 IMPLANT
SUT VIC AB 2-0 CT1 27 (SUTURE) ×4
SUT VIC AB 2-0 CT1 TAPERPNT 27 (SUTURE) ×2 IMPLANT
SYR 20CC LL (SYRINGE) ×4 IMPLANT
TOWEL OR 17X24 6PK STRL BLUE (TOWEL DISPOSABLE) ×2 IMPLANT
TOWEL OR 17X26 10 PK STRL BLUE (TOWEL DISPOSABLE) ×2 IMPLANT
WATER STERILE IRR 1000ML POUR (IV SOLUTION) ×4 IMPLANT

## 2015-02-15 NOTE — Plan of Care (Signed)
Problem: Consults Goal: Diagnosis- Total Joint Replacement Primary Total Knee Left     

## 2015-02-15 NOTE — Plan of Care (Signed)
Problem: Consults Goal: Diagnosis- Total Joint Replacement Outcome: Completed/Met Date Met:  02/15/15 Primary Total Knee left

## 2015-02-15 NOTE — Progress Notes (Signed)
Utilization review completed.  

## 2015-02-15 NOTE — Anesthesia Preprocedure Evaluation (Addendum)
Anesthesia Evaluation  Patient identified by MRN, date of birth, ID band Patient awake    Reviewed: Allergy & Precautions, H&P , NPO status , Patient's Chart, lab work & pertinent test results  Airway Mallampati: II  TM Distance: >3 FB Neck ROM: Full    Dental no notable dental hx. (+) Edentulous Upper, Partial Lower, Dental Advisory Given, Dental Advidsory Given   Pulmonary neg pulmonary ROS, former smoker,  breath sounds clear to auscultation  Pulmonary exam normal       Cardiovascular hypertension, Pt. on medications + pacemaker Rhythm:Regular Rate:Normal     Neuro/Psych  Headaches, negative neurological ROS  negative psych ROS   GI/Hepatic Neg liver ROS, GERD-  Medicated and Controlled,  Endo/Other  diabetes  Renal/GU negative Renal ROS  negative genitourinary   Musculoskeletal  (+) Arthritis -, Osteoarthritis,    Abdominal   Peds  Hematology negative hematology ROS (+)   Anesthesia Other Findings   Reproductive/Obstetrics negative OB ROS                           Anesthesia Physical Anesthesia Plan  ASA: II  Anesthesia Plan: General and Regional   Post-op Pain Management:    Induction: Intravenous  Airway Management Planned: LMA  Additional Equipment:   Intra-op Plan:   Post-operative Plan: Extubation in OR  Informed Consent: I have reviewed the patients History and Physical, chart, labs and discussed the procedure including the risks, benefits and alternatives for the proposed anesthesia with the patient or authorized representative who has indicated his/her understanding and acceptance.   Dental advisory given and Dental Advisory Given  Plan Discussed with: CRNA and Anesthesiologist  Anesthesia Plan Comments:        Anesthesia Quick Evaluation

## 2015-02-15 NOTE — Progress Notes (Signed)
Orthopedic Tech Progress Note Patient Details:  Edgar Evans 1941-01-21 [id number]  CPM Left Knee CPM Left Knee: On Left Knee Flexion (Degrees): 90 Left Knee Extension (Degrees): 0 Additional Comments: trapeze bar patient helper Viewed order from doctor's order list  Hildred Priest 02/15/2015, 1:37 PM

## 2015-02-15 NOTE — Transfer of Care (Signed)
Immediate Anesthesia Transfer of Care Note  Patient: Edgar Evans  Procedure(s) Performed: Procedure(s): LEFT TOTAL KNEE ARTHROPLASTY (Left)  Patient Location: PACU  Anesthesia Type:General  Level of Consciousness: sedated  Airway & Oxygen Therapy: Patient Spontanous Breathing and Patient connected to nasal cannula oxygen  Post-op Assessment: Report given to RN, Post -op Vital signs reviewed and stable and Patient moving all extremities X 4  Post vital signs: Reviewed and stable  Last Vitals:  Filed Vitals:   02/15/15 0940  BP:   Pulse: 60  Temp:   Resp: 10    Complications: No apparent anesthesia complications
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
## 2015-02-15 NOTE — Anesthesia Postprocedure Evaluation (Signed)
  Anesthesia Post-op Note  Patient: Edgar Evans  Procedure(s) Performed: Procedure(s): LEFT TOTAL KNEE ARTHROPLASTY (Left)  Patient Location: PACU  Anesthesia Type:General and block  Level of Consciousness: awake and alert   Airway and Oxygen Therapy: Patient Spontanous Breathing  Post-op Pain: moderate  Post-op Assessment: Post-op Vital signs reviewed, Patient's Cardiovascular Status Stable and Respiratory Function Stable  Post-op Vital Signs: Reviewed  Filed Vitals:   02/15/15 1342  BP: 114/65  Pulse: 61  Temp:   Resp: 11    Complications: No apparent anesthesia complications

## 2015-02-15 NOTE — Anesthesia Procedure Notes (Addendum)
Anesthesia Regional Block:  Adductor canal block  Pre-Anesthetic Checklist: ,, timeout performed, Correct Patient, Correct Site, Correct Laterality, Correct Procedure, Correct Position, site marked, Risks and benefits discussed, pre-op evaluation,  At surgeon's request and post-op pain management  Laterality: Left  Prep: Maximum Sterile Barrier Precautions used and chloraprep       Needles:  Injection technique: Single-shot  Needle Type: Echogenic Stimulator Needle     Needle Length: 5cm 5 cm Needle Gauge: 22 and 22 G    Additional Needles:  Procedures: ultrasound guided (picture in chart) Adductor canal block Narrative:  Start time: 02/15/2015 8:45 AM End time: 02/15/2015 8:54 AM Injection made incrementally with aspirations every 5 mL. Anesthesiologist: Roderic Palau  Additional Notes: 2% Lidocaine skin wheel.    Procedure Name: LMA Insertion Date/Time: 02/15/2015 10:20 AM Performed by: Neldon Newport Pre-anesthesia Checklist: Patient being monitored, Suction available, Emergency Drugs available, Patient identified and Timeout performed Patient Re-evaluated:Patient Re-evaluated prior to inductionOxygen Delivery Method: Circle system utilized Preoxygenation: Pre-oxygenation with 100% oxygen Intubation Type: IV induction LMA: LMA inserted LMA Size: 5.0 Placement Confirmation: positive ETCO2 and breath sounds checked- equal and bilateral Tube secured with: Tape Dental Injury: Teeth and Oropharynx as per pre-operative assessment

## 2015-02-15 NOTE — Evaluation (Signed)
Physical Therapy Evaluation Patient Details Name: Edgar Evans MRN: [id number] DOB: [birth year] Today's Date: 02/15/2015   History of Present Illness  Patient is 74 y/o male s/p L TKA. PMH includes HLD, BPH, HTN, DM.  Clinical Impression  Patient presents with pain and post surgical deficits LLE s/p L TKA impacting mobility. Tolerated ambulation with Min guard assist for safety. Pt will have 24/7 S at home from wife. Would benefit from skilled PT to maximize independence and mobility prior to return home. Plan to d/c tomorrow pending progress in mobility.     Follow Up Recommendations Home health PT;Supervision/Assistance - 24 hour    Equipment Recommendations  None recommended by PT    Recommendations for Other Services       Precautions / Restrictions Precautions Precautions: Knee Precaution Booklet Issued: No Precaution Comments: Reviewed no pillow under knee and zero degree foam. Restrictions Weight Bearing Restrictions: Yes LLE Weight Bearing: Weight bearing as tolerated      Mobility  Bed Mobility Overal bed mobility: Needs Assistance Bed Mobility: Supine to Sit     Supine to sit: Modified independent (Device/Increase time);HOB elevated     General bed mobility comments: Use of rails.   Transfers Overall transfer level: Needs assistance Equipment used: Rolling walker (2 wheeled) Transfers: Sit to/from Stand Sit to Stand: Min guard         General transfer comment: Min guard for safety. No dizziness.  Ambulation/Gait Ambulation/Gait assistance: Min guard Ambulation Distance (Feet): 60 Feet Assistive device: Rolling walker (2 wheeled) Gait Pattern/deviations: Step-to pattern;Decreased stance time - left;Decreased step length - right;Trunk flexed   Gait velocity interpretation: Below normal speed for age/gender General Gait Details: Cues for RW management. + nausea post ambulation bout.   Stairs            Wheelchair Mobility    Modified  Rankin (Stroke Patients Only)       Balance Overall balance assessment: Needs assistance Sitting-balance support: Feet supported;No upper extremity supported Sitting balance-Leahy Scale: Good     Standing balance support: During functional activity Standing balance-Leahy Scale: Fair                               Pertinent Vitals/Pain Pain Assessment: Faces Faces Pain Scale: Hurts even more Pain Location: left knee Pain Descriptors / Indicators: Sore;Aching Pain Intervention(s): Limited activity within patient's tolerance;Repositioned;Monitored during session;Patient requesting pain meds-RN notified    Home Living Family/patient expects to be discharged to:: Private residence Living Arrangements: Spouse/significant other Available Help at Discharge: Family;Available 24 hours/day Type of Home: House Home Access: Level entry     Home Layout: Multi-level Home Equipment: Walker - 2 wheels;Bedside commode      Prior Function Level of Independence: Independent         Comments: Very active.      Hand Dominance        Extremity/Trunk Assessment   Upper Extremity Assessment: Defer to OT evaluation           Lower Extremity Assessment: LLE deficits/detail   LLE Deficits / Details: Limited AROM secondary to post surgery/pain.      Communication   Communication: No difficulties  Cognition Arousal/Alertness: Awake/alert Behavior During Therapy: WFL for tasks assessed/performed Overall Cognitive Status: Within Functional Limits for tasks assessed                      General Comments  Exercises Total Joint Exercises Ankle Circles/Pumps: Both;15 reps;Supine Quad Sets: 10 reps;Both;Supine      Assessment/Plan    PT Assessment Patient needs continued PT services  PT Diagnosis Acute pain;Difficulty walking   PT Problem List Decreased strength;Decreased range of motion;Decreased activity tolerance;Decreased balance;Decreased  mobility;Impaired sensation;Pain  PT Treatment Interventions Balance training;Gait training;Stair training;Functional mobility training;Therapeutic activities;Therapeutic exercise;Patient/family education   PT Goals (Current goals can be found in the Care Plan section) Acute Rehab PT Goals Patient Stated Goal: to go home PT Goal Formulation: With patient/family Time For Goal Achievement: 03/01/15 Potential to Achieve Goals: Good    Frequency 7X/week   Barriers to discharge        Co-evaluation               End of Session Equipment Utilized During Treatment: Gait belt Activity Tolerance: Patient tolerated treatment well Patient left: in chair;with call bell/phone within reach;with family/visitor present Nurse Communication: Mobility status         Time: 4825-0037 PT Time Calculation (min) (ACUTE ONLY): 26 min   Charges:   PT Evaluation $Initial PT Evaluation Tier I: 1 Procedure PT Treatments $Gait Training: 8-22 mins   PT G Codes:        Zykerria Tanton A Zanaya Baize 02/15/2015, 4:48 PM Wray Kearns, [place], DPT [phone]

## 2015-02-16 ENCOUNTER — Encounter (HOSPITAL_COMMUNITY): Payer: Self-pay | Admitting: Orthopedic Surgery

## 2015-02-16 LAB — BASIC METABOLIC PANEL
Anion gap: 4 — ABNORMAL LOW (ref 5–15)
BUN: 14 mg/dL (ref 6–20)
CO2: 29 mmol/L (ref 22–32)
Calcium: 8.5 mg/dL — ABNORMAL LOW (ref 8.9–10.3)
Chloride: 104 mmol/L (ref 101–111)
Creatinine, Ser: 0.94 mg/dL (ref 0.61–1.24)
GFR calc Af Amer: >60 mL/min (ref >60)
GFR calc non Af Amer: >60 mL/min (ref >60)
Glucose, Bld: 135 mg/dL — ABNORMAL HIGH (ref 65–99)
Potassium: 4.3 mmol/L (ref 3.5–5.1)
Sodium: 137 mmol/L (ref 135–145)

## 2015-02-16 LAB — CBC
HCT: 36.1 % — ABNORMAL LOW (ref 39.0–52.0)
Hemoglobin: 12 g/dL — ABNORMAL LOW (ref 13.0–17.0)
MCH: 28.9 pg (ref 26.0–34.0)
MCHC: 33.2 g/dL (ref 30.0–36.0)
MCV: 87 fL (ref 78.0–100.0)
Platelets: 159 10*3/uL (ref 150–400)
RBC: 4.15 MIL/uL — ABNORMAL LOW (ref 4.22–5.81)
RDW: 12.9 % (ref 11.5–15.5)
WBC: 10.8 10*3/uL — ABNORMAL HIGH (ref 4.0–10.5)

## 2015-02-16 LAB — GLUCOSE, CAPILLARY: Glucose-Capillary: 126 mg/dL — ABNORMAL HIGH (ref 65–99)

## 2015-02-16 MED ORDER — OXYCODONE HCL 5 MG PO TABS: 5.0000 mg | ORAL_TABLET | ORAL | Status: DC | PRN

## 2015-02-16 MED ORDER — ENOXAPARIN SODIUM 40 MG/0.4ML ~~LOC~~ SOLN: 40.0000 mg | SUBCUTANEOUS | Status: DC

## 2015-02-16 MED ORDER — OXYCODONE HCL ER 10 MG PO T12A: 10.0000 mg | EXTENDED_RELEASE_TABLET | Freq: Two times a day (BID) | ORAL | Status: DC

## 2015-02-16 MED ORDER — METHOCARBAMOL 500 MG PO TABS: 500.0000 mg | ORAL_TABLET | Freq: Four times a day (QID) | ORAL | Status: DC | PRN

## 2015-02-16 NOTE — Progress Notes (Addendum)
Discharge teaching given to pt, wife, and other family member at bedside. All questions answered. Peripheral IV removed, belongings gathered. Wheeled out by Psychologist, occupational.   Raquel James 02/16/2015

## 2015-02-16 NOTE — Progress Notes (Signed)
Physical Therapy Treatment Patient Details Name: Edgar Evans MRN: [id number] DOB: 01-29-1941 Today's Date: 02/16/2015    History of Present Illness Patient is 74 y/o male s/p L TKA. PMH includes HLD, BPH, HTN, DM.    PT Comments    Patient doing well with PT sessions. Ambulated with supervision and front wheel walker 70 feet. Transfers supine to sit with supervision flat bed no rails. Lt. Knee ROM 12-95 degrees. Reviewed HEP with emphasis on seated knee flexion and extension hang. To continue with HEP upon D/C. Patient reports that he is hoping to be able to go home this afternoon. He and family denied any questions or concerns. Reported home health arranged.   Follow Up Recommendations  Home health PT;Supervision - Intermittent     Equipment Recommendations  None recommended by PT    Recommendations for Other Services       Precautions / Restrictions Precautions Precautions: Knee Precaution Booklet Issued: Yes (comment) Precaution Comments: Provided and reviewed TKA handout with exercieses.  Restrictions Weight Bearing Restrictions: Yes LLE Weight Bearing: Weight bearing as tolerated    Mobility  Bed Mobility Overal bed mobility: Independent Bed Mobility: Supine to Sit     Supine to sit: Independent     General bed mobility comments: Bed mobility performed with HOB flat and no rails.   Transfers Overall transfer level: Needs assistance Equipment used: Rolling walker (2 wheeled) Transfers: Sit to/from Stand Sit to Stand: Supervision         General transfer comment: Supervision with cues for using hands from bed to assist.   Ambulation/Gait Ambulation/Gait assistance: Supervision Ambulation Distance (Feet): 75 Feet Assistive device: Rolling walker (2 wheeled) Gait Pattern/deviations: Step-through pattern;Decreased step length - left   Gait velocity interpretation: Below normal speed for age/gender General Gait Details: step through pattern, mild upper  extremity support through walker. Denies nausea or dizziness   Stairs            Wheelchair Mobility    Modified Rankin (Stroke Patients Only)       Balance Overall balance assessment: Needs assistance Sitting-balance support: No upper extremity supported;Feet supported Sitting balance-Leahy Scale: Good     Standing balance support: During functional activity Standing balance-Leahy Scale: Fair Standing balance comment: No loss of balance noted during ambulation or standing.                    Cognition Arousal/Alertness: Awake/alert Behavior During Therapy: WFL for tasks assessed/performed Overall Cognitive Status: Within Functional Limits for tasks assessed                      Exercises Total Joint Exercises Ankle Circles/Pumps: Both;15 reps;Supine Quad Sets: Strengthening;10 reps;Supine (5 second holds) Knee Flexion: AAROM;Left;10 reps;Seated (endrange hold) Goniometric ROM: 12-95 degrees Other Exercises Other Exercises: Knee extension hang    General Comments        Pertinent Vitals/Pain Pain Assessment: No/denies pain Pain Score: 2  Pain Location: L knee Pain Descriptors / Indicators: Aching Pain Intervention(s): Monitored during session    Home Living Family/patient expects to be discharged to:: Private residence Living Arrangements: Spouse/significant other Available Help at Discharge: Family;Available 24 hours/day Type of Home: House Home Access: Level entry   Home Layout: Multi-level Home Equipment: Walker - 2 wheels;Bedside commode      Prior Function Level of Independence: Independent      Comments: Very active.    PT Goals (current goals can now be found in the care plan  section) Acute Rehab PT Goals Potential to Achieve Goals: Good Progress towards PT goals: Progressing toward goals    Frequency  7X/week    PT Plan Current plan remains appropriate    Co-evaluation             End of Session Equipment  Utilized During Treatment: Gait belt Activity Tolerance: Patient tolerated treatment well Patient left: in chair;with call bell/phone within reach;with family/visitor present     Time: 0925-0958 PT Time Calculation (min) (ACUTE ONLY): 33 min  Charges:  $Gait Training: 8-22 mins $Therapeutic Exercise: 8-22 mins                    G Codes:     Cassell Clement, PT, CSCS  02/16/2015, 12:03 PM

## 2015-02-16 NOTE — Discharge Instructions (Signed)

## 2015-02-16 NOTE — Evaluation (Signed)
Occupational Therapy Evaluation Patient Details Name: Edgar Evans MRN: [id number] DOB: 1940-10-08 Today's Date: 02/16/2015    History of Present Illness Patient is 74 y/o male s/p L TKA. PMH includes HLD, BPH, HTN, DM.   Clinical Impression   Pt is at min A level with LB ADLs and sup with ADL mobility. Pt will have 24 hours assist at home form his wife and all necessary DME and A/E from wife's previous knee and hip surgeries    Follow Up Recommendations  No OT follow up    Equipment Recommendations  None recommended by OT    Recommendations for Other Services       Precautions / Restrictions Precautions Precautions: Knee Precaution Booklet Issued: Yes (comment) Precaution Comments: Provided and reviewed TKA handout with exercieses.  Restrictions Weight Bearing Restrictions: Yes LLE Weight Bearing: Weight bearing as tolerated      Mobility Bed Mobility Overal bed mobility: Independent Bed Mobility: Supine to Sit;Sit to Supine     Supine to sit: Independent Sit to supine: Independent   General bed mobility comments: Bed mobility performed with HOB flat and no rails.   Transfers Overall transfer level: Needs assistance Equipment used: Rolling walker (2 wheeled) Transfers: Sit to/from Stand Sit to Stand: Supervision         General transfer comment: Supervision with cues for using hands from bed to assist.     Balance Overall balance assessment: Needs assistance Sitting-balance support: Feet supported;No upper extremity supported Sitting balance-Leahy Scale: Good     Standing balance support: During functional activity Standing balance-Leahy Scale: Fair Standing balance comment: No loss of balance noted during ambulation or standing.                            ADL Overall ADL's : Needs assistance/impaired     Grooming: Wash/dry hands;Wash/dry face;Supervision/safety;Standing   Upper Body Bathing: Supervision/ safety;Set up;Sitting    Lower Body Bathing: Minimal assistance;Sit to/from stand   Upper Body Dressing : Supervision/safety;Set up;Sitting   Lower Body Dressing: Sit to/from stand;Minimal assistance   Toilet Transfer: RW;Supervision/safety   Toileting- Clothing Manipulation and Hygiene: Min guard;Sit to/from stand   Tub/ Banker: Supervision/safety;3 in 1   Functional mobility during ADLs: Supervision/safety       Vision  wears glasses, no change from baseline   Perception Perception Perception Tested?: No   Praxis Praxis Praxis tested?: Not tested    Pertinent Vitals/Pain Pain Assessment: 0-10 Pain Score: 3  Pain Location: L knee Pain Descriptors / Indicators: Aching;Sore Pain Intervention(s): Monitored during session     Hand Dominance Right   Extremity/Trunk Assessment Upper Extremity Assessment Upper Extremity Assessment: Overall WFL for tasks assessed   Lower Extremity Assessment Lower Extremity Assessment: Defer to PT evaluation   Cervical / Trunk Assessment Cervical / Trunk Assessment: Normal   Communication Communication Communication: No difficulties   Cognition Arousal/Alertness: Awake/alert Behavior During Therapy: WFL for tasks assessed/performed Overall Cognitive Status: Within Functional Limits for tasks assessed                     General Comments   pt pleasant and cooperative, family supportive                Home Living Family/patient expects to be discharged to:: Private residence Living Arrangements: Spouse/significant other Available Help at Discharge: Family;Available 24 hours/day Type of Home: House Home Access: Level entry     Home Layout: Multi-level  Bathroom Shower/Tub: Chief Strategy Officer: Environmental consultant - 2 wheels;Bedside commode;Adaptive equipment Adaptive Equipment: Reacher;Sock aid;Long-handled shoe horn;Long-handled sponge        Prior Functioning/Environment Level of Independence: Independent         Comments: Very active.     OT Diagnosis: Generalized weakness   OT Problem List: Impaired balance (sitting and/or standing);Pain;Decreased activity tolerance   OT Treatment/Interventions:      OT Goals(Current goals can be found in the care plan section) Acute Rehab OT Goals Patient Stated Goal: to go home OT Goal Formulation: With patient  OT Frequency:     Barriers to D/C:  none                        End of Session Equipment Utilized During Treatment: Rolling walker CPM Left Knee CPM Left Knee: Off  Activity Tolerance: Patient tolerated treatment well Patient left: in bed;with call bell/phone within reach;with family/visitor present   Time: 1308-6578 OT Time Calculation (min): 28 min Charges:  OT General Charges $OT Visit: 1 Procedure OT Evaluation $Initial OT Evaluation Tier I: 1 Procedure OT Treatments $Therapeutic Activity: 8-22 mins G-Codes:    [name] 02/16/2015, 2:28 PM

## 2015-02-16 NOTE — Op Note (Signed)
TOTAL KNEE REPLACEMENT OPERATIVE NOTE:  02/15/2015  10:50 AM  PATIENT:  Edgar Evans  74 y.o. male  PRE-OPERATIVE DIAGNOSIS:  Primary osteoarthritis left knee  POST-OPERATIVE DIAGNOSIS:  Primary osteoarthritis left knee  PROCEDURE:  Procedure(s): LEFT TOTAL KNEE ARTHROPLASTY  SURGEON:  Surgeon(s): Vickey Huger, MD  PHYSICIAN ASSISTANT: Carlynn Spry, [facility]  ANESTHESIA:   general  DRAINS: Hemovac  SPECIMEN: None  COUNTS:  Correct  TOURNIQUET:   Total Tourniquet Time Documented: Thigh (Left) - 74 minutes Total: Thigh (Left) - 74 minutes   DICTATION:  Indication for procedure:    The patient is a 74 y.o. male who has failed conservative treatment for Primary osteoarthritis left knee.  Informed consent was obtained prior to anesthesia. The risks versus benefits of the operation were explain and in a way the patient can, and did, understand.   On the implant demand matching protocol, this patient scored 8.  Therefore, this patient was not receive a polyethylene insert with vitamin E which is a high demand implant.  Description of procedure:     The patient was taken to the operating room and placed under anesthesia.  The patient was positioned in the usual fashion taking care that all body parts were adequately padded and/or protected.  I foley catheter was not placed.  A tourniquet was applied and the leg prepped and draped in the usual sterile fashion.  The extremity was exsanguinated with the esmarch and tourniquet inflated to 350 mmHg.  Pre-operative range of motion was normal.  The knee was in 5 degree of mild varus.  A midline incision approximately 6-7 inches long was made with a #10 blade.  A new blade was used to make a parapatellar arthrotomy going 2-3 cm into the quadriceps tendon, over the patella, and alongside the medial aspect of the patellar tendon.  A synovectomy was then performed with the #10 blade and forceps. I then elevated the deep MCL off the medial  tibial metaphysis subperiosteally around to the semimembranosus attachment.    I everted the patella and used calipers to measure patellar thickness.  I used the reamer to ream down to appropriate thickness to recreate the native thickness.  I then removed excess bone with the rongeur and sagittal saw.  I used the appropriately sized template and drilled the three lug holes.  I then put the trial in place and measured the thickness with the calipers to ensure recreation of the native thickness.  The trial was then removed and the patella subluxed and the knee brought into flexion.  A homan retractor was place to retract and protect the patella and lateral structures.  A Z-retractor was place medially to protect the medial structures.  The extra-medullary alignment system was used to make cut the tibial articular surface perpendicular to the anamotic axis of the tibia and in 3 degrees of posterior slope.  The cut surface and alignment jig was removed.  I then used the Sheldon to make a valgus cut on the distal femur.  I then marked out the epicondylar axis on the distal femur.  The posterior condylar axis measured 3 degrees.  I then used the anterior referencing sizer and measured the femur to be a size 5.  The 4-In-1 cutting block was screwed into place in external rotation matching the posterior condylar angle, making our cuts perpendicular to the epicondylar axis.  Anterior, posterior and chamfer cuts were made with the sagittal saw.  The cutting block and cut  pieces were removed.  A lamina spreader was placed in 90 degrees of flexion.  The ACL, PCL, menisci, and posterior condylar osteophytes were removed.  A 10 mm spacer blocked was found to offer good flexion and extension gap balance after minimal in degree releasing.   The scoop retractor was then placed and the femoral finishing block was pinned in place.  The small sagittal saw was used as well as the lug drill to  finish the femur.  The block and cut surfaces were removed and the medullary canal hole filled with autograft bone from the cut pieces.  The tibia was delivered forward in deep flexion and external rotation.  A size 5 tray was selected and pinned into place centered on the medial 1/3 of the tibial tubercle.  The reamer and keel was used to prepare the tibia through the tray.    I then trialed with the size 5 femur, size 5 tibia, a 10 mm insert and the 4 patella.  I had excellent flexion/extension gap balance, excellent patella tracking.  Flexion was full and beyond 120 degrees; extension was zero.  These components were chosen and the staff opened them to me on the back table while the knee was lavaged copiously and the cement mixed.  The soft tissue was infiltrated with 60cc of exparel 1.3% through a 21 gauge needle.  I cemented in the components and removed all excess cement.  The polyethylene tibial component was snapped into place and the knee placed in extension while cement was hardening.  The capsule was infilltrated with 30cc of .25% Marcaine with epinephrine.  A hemovac was place in the joint exiting superolaterally.  A pain pump was place superomedially superficial to the arthrotomy.  Once the cement was hard, the tourniquet was let down.  Hemostasis was obtained.  The arthrotomy was closed with figure-8 #1 vicryl sutures.  The deep soft tissues were closed with #0 vicryls and the subcuticular layer closed with a running #2-0 vicryl.  The skin was reapproximated and closed with skin staples.  The wound was dressed with xeroform, 4 x4's, 2 ABD sponges, a single layer of webril and a TED stocking.   The patient was then awakened, extubated, and taken to the recovery room in stable condition.  BLOOD LOSS:  300cc DRAINS: 1 hemovac, 1 pain catheter COMPLICATIONS:  None.  PLAN OF CARE: Admit to inpatient   PATIENT DISPOSITION:  PACU - hemodynamically stable.   Delay start of Pharmacological VTE  agent (>24hrs) due to surgical blood loss or risk of bleeding:  not applicable  Please fax a copy of this op note to my office at [phone] (please only include page 1 and 2 of the Case Information op note)

## 2015-02-16 NOTE — Progress Notes (Signed)
SPORTS MEDICINE AND JOINT REPLACEMENT  Lara Mulch, MD   Carlynn Spry, PA-C [place], [place], [place]  [address]                             [phone]   PROGRESS NOTE  Subjective:  negative for Chest Pain  negative for Shortness of Breath  negative for Nausea/Vomiting   negative for Calf Pain  negative for Bowel Movement   Tolerating Diet: yes         Patient reports pain as 4 on 0-10 scale.    Objective: Vital signs in last 24 hours:   Patient Vitals for the past 24 hrs:  BP Temp Temp src Pulse Resp SpO2  02/16/15 0613 (!) 103/52 mmHg 97.6 F (36.4 C) Oral 66 - 98 %  02/15/15 2032 130/63 mmHg 97.6 F (36.4 C) Oral 62 - 99 %  02/15/15 1438 118/66 mmHg 97 F (36.1 C) Oral 71 16 100 %  02/15/15 1427 109/60 mmHg - - 72 18 99 %  02/15/15 1415 - - - 61 (!) 8 100 %  02/15/15 1400 - - - 61 10 100 %  02/15/15 1345 - - - 67 10 100 %  02/15/15 1342 114/65 mmHg - - 61 11 100 %  02/15/15 1330 - - - 67 13 100 %  02/15/15 1315 - - - 66 10 100 %  02/15/15 1310 125/73 mmHg 97 F (36.1 C) - 71 12 100 %  02/15/15 1306 - - - 69 12 100 %  02/15/15 1300 - - - 75 18 100 %  02/15/15 1255 128/65 mmHg - - 72 16 100 %  02/15/15 1245 - - - 66 10 100 %  02/15/15 1240 125/67 mmHg (!) 96.5 F (35.8 C) - 82 15 100 %    @flow {1959:LAST@   Intake/Output from previous day:   06/20 0701 - 06/21 0700 In: 3323.3 [P.O.:120; I.V.:2948.3] Out: 1150 [Urine:1100]   Intake/Output this shift:       Intake/Output      06/20 0701 - 06/21 0700 06/21 0701 - 06/22 0700   P.O. 120    I.V. (mL/kg) 2948.3 (35.3)    IV Piggyback 255    Total Intake(mL/kg) 3323.3 (39.8)    Urine (mL/kg/hr) 1100    Stool 0    Blood 50    Total Output 1150     Net +2173.3          Urine Occurrence 4 x 1 x   Stool Occurrence 1 x       LABORATORY DATA:  Recent Labs  02/15/15 1609 02/16/15 0730  WBC 5.9 10.8*  HGB 12.7* 12.0*  HCT 38.1* 36.1*  PLT 138* 159    Recent Labs  02/15/15 1609  02/16/15 0730  NA  --  137  K  --  4.3  CL  --  104  CO2  --  29  BUN  --  14  CREATININE 0.85 0.94  GLUCOSE  --  135*  CALCIUM  --  8.5*   Lab Results  Component Value Date   INR 1.10 02/04/2015    Examination:  General appearance: alert, cooperative and no distress Extremities: Homans sign is negative, no sign of DVT  Wound Exam: clean, dry, intact   Drainage:  None: wound tissue dry  Motor Exam: EHL and FHL Intact  Sensory Exam: Deep Peroneal normal   Assessment:    1 Day  Post-Op  Procedure(s) (LRB): LEFT TOTAL KNEE ARTHROPLASTY (Left)  ADDITIONAL DIAGNOSIS:  Active Problems:   DJD (degenerative joint disease) of knee  Acute Blood Loss Anemia   Plan: Physical Therapy as ordered Weight Bearing as Tolerated (WBAT)  DVT Prophylaxis:  Lovenox  DISCHARGE PLAN: Home  DISCHARGE NEEDS: HHPT, CPM, Walker and 3-in-1 comode seat         Edgar Evans 02/16/2015, 12:27 PM

## 2015-02-16 NOTE — Care Management Note (Signed)
Case Management Note  Patient Details  Name: Edgar Evans MRN: [id number] Date of Birth: 08-03-1941  Subjective/Objective:            S/p left total knee arthroplasty        Action/Plan: Set up with [facility] for HHPT by MD office. Spoke with patient and his wife, no change in discharge plan. CPM delivered to home by [facility] [place]. Patient states that he already had 3N1 and rolling walker. Patient also states that his wife will be available to assist after discharge. Contacted [facility] [phone], spoke with [facility], faxed demographics, order, face to face, H and P, op note and d/c note to [phone]. Received confirmation.  Expected Discharge Date:                  Expected Discharge Plan:  [place]  In-House Referral:  NA  Discharge planning Services  CM Consult  Post Acute Care Choice:  Durable Medical Equipment, Home Health Choice offered to:  Patient  DME Arranged:  CPM DME Agency:  [employer]  HH Arranged:  PT HH Agency:  [facility]  Status of Service:  Completed, signed off  Medicare Important Message Given:  N/A - LOS <3 / Initial given by admissions Date Medicare IM Given:    Medicare IM give by:    Date Additional Medicare IM Given:    Additional Medicare Important Message give by:     If discussed at [place] of Stay Meetings, dates discussed:    Additional Comments:  Nila Nephew, RN 02/16/2015, 2:01 PM

## 2015-02-17 DIAGNOSIS — R262 Difficulty in walking, not elsewhere classified: Secondary | ICD-10-CM | POA: Diagnosis not present

## 2015-02-17 DIAGNOSIS — M1712 Unilateral primary osteoarthritis, left knee: Secondary | ICD-10-CM | POA: Diagnosis not present

## 2015-02-18 DIAGNOSIS — M1712 Unilateral primary osteoarthritis, left knee: Secondary | ICD-10-CM | POA: Diagnosis not present

## 2015-02-18 DIAGNOSIS — R262 Difficulty in walking, not elsewhere classified: Secondary | ICD-10-CM | POA: Diagnosis not present

## 2015-02-19 DIAGNOSIS — M1712 Unilateral primary osteoarthritis, left knee: Secondary | ICD-10-CM | POA: Diagnosis not present

## 2015-02-19 DIAGNOSIS — R262 Difficulty in walking, not elsewhere classified: Secondary | ICD-10-CM | POA: Diagnosis not present

## 2015-02-22 DIAGNOSIS — R262 Difficulty in walking, not elsewhere classified: Secondary | ICD-10-CM | POA: Diagnosis not present

## 2015-02-22 DIAGNOSIS — M1712 Unilateral primary osteoarthritis, left knee: Secondary | ICD-10-CM | POA: Diagnosis not present
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
## 2015-02-24 DIAGNOSIS — M1712 Unilateral primary osteoarthritis, left knee: Secondary | ICD-10-CM | POA: Diagnosis not present

## 2015-02-24 DIAGNOSIS — R262 Difficulty in walking, not elsewhere classified: Secondary | ICD-10-CM | POA: Diagnosis not present

## 2015-02-26 DIAGNOSIS — M1712 Unilateral primary osteoarthritis, left knee: Secondary | ICD-10-CM | POA: Diagnosis not present

## 2015-02-26 DIAGNOSIS — R262 Difficulty in walking, not elsewhere classified: Secondary | ICD-10-CM | POA: Diagnosis not present

## 2015-03-02 DIAGNOSIS — Z96652 Presence of left artificial knee joint: Secondary | ICD-10-CM | POA: Diagnosis not present

## 2015-03-02 DIAGNOSIS — Z471 Aftercare following joint replacement surgery: Secondary | ICD-10-CM | POA: Diagnosis not present

## 2015-03-03 DIAGNOSIS — R262 Difficulty in walking, not elsewhere classified: Secondary | ICD-10-CM | POA: Diagnosis not present

## 2015-03-03 DIAGNOSIS — M6281 Muscle weakness (generalized): Secondary | ICD-10-CM | POA: Diagnosis not present

## 2015-03-03 DIAGNOSIS — Z96652 Presence of left artificial knee joint: Secondary | ICD-10-CM | POA: Diagnosis not present

## 2015-03-04 DIAGNOSIS — Z96652 Presence of left artificial knee joint: Secondary | ICD-10-CM | POA: Diagnosis not present

## 2015-03-04 DIAGNOSIS — Z96659 Presence of unspecified artificial knee joint: Secondary | ICD-10-CM | POA: Diagnosis not present

## 2015-03-04 DIAGNOSIS — M1009 Idiopathic gout, multiple sites: Secondary | ICD-10-CM | POA: Diagnosis not present

## 2015-03-04 DIAGNOSIS — R262 Difficulty in walking, not elsewhere classified: Secondary | ICD-10-CM | POA: Diagnosis not present

## 2015-03-04 DIAGNOSIS — M79675 Pain in left toe(s): Secondary | ICD-10-CM | POA: Diagnosis not present

## 2015-03-04 DIAGNOSIS — M6281 Muscle weakness (generalized): Secondary | ICD-10-CM | POA: Diagnosis not present

## 2015-03-09 DIAGNOSIS — R262 Difficulty in walking, not elsewhere classified: Secondary | ICD-10-CM | POA: Diagnosis not present

## 2015-03-09 DIAGNOSIS — Z96652 Presence of left artificial knee joint: Secondary | ICD-10-CM | POA: Diagnosis not present

## 2015-03-09 DIAGNOSIS — M6281 Muscle weakness (generalized): Secondary | ICD-10-CM | POA: Diagnosis not present

## 2015-03-11 DIAGNOSIS — M6281 Muscle weakness (generalized): Secondary | ICD-10-CM | POA: Diagnosis not present

## 2015-03-11 DIAGNOSIS — R262 Difficulty in walking, not elsewhere classified: Secondary | ICD-10-CM | POA: Diagnosis not present

## 2015-03-11 DIAGNOSIS — Z96652 Presence of left artificial knee joint: Secondary | ICD-10-CM | POA: Diagnosis not present

## 2015-03-18 DIAGNOSIS — R262 Difficulty in walking, not elsewhere classified: Secondary | ICD-10-CM | POA: Diagnosis not present

## 2015-03-18 DIAGNOSIS — Z96652 Presence of left artificial knee joint: Secondary | ICD-10-CM | POA: Diagnosis not present

## 2015-03-18 DIAGNOSIS — M6281 Muscle weakness (generalized): Secondary | ICD-10-CM | POA: Diagnosis not present

## 2015-03-19 ENCOUNTER — Telehealth: Payer: Self-pay | Admitting: *Deleted

## 2015-03-19 DIAGNOSIS — Z96652 Presence of left artificial knee joint: Secondary | ICD-10-CM | POA: Diagnosis not present

## 2015-03-19 DIAGNOSIS — M6281 Muscle weakness (generalized): Secondary | ICD-10-CM | POA: Diagnosis not present

## 2015-03-19 DIAGNOSIS — R262 Difficulty in walking, not elsewhere classified: Secondary | ICD-10-CM | POA: Diagnosis not present

## 2015-03-19 NOTE — Telephone Encounter (Signed)
Wife calling with concerns over low BP readings.  Recently had knee surgery & been on a lot of pain meds and muscle relaxers.  BP today after therapy was 80/50  80's - did note a little dizziness.  While on phone, BP was 105/56  86.   Stated his appetite has not been great, but she is trying to keep him hydrated.    Advised to continue to monitor over the weekend.  Currently on Lisinopril 5mg  daily that was started December.  Since coming up on weekend, advised to take 1/2 of Lisinopril if numbers continue to stay low with symptoms.  Advised to call office back on Monday if she has to do this so we can notify provider.  Otherwise, keep readings and call with update end of next week.  Wife verbalized understanding.

## 2015-03-22 ENCOUNTER — Telehealth: Payer: Self-pay | Admitting: *Deleted

## 2015-03-22 DIAGNOSIS — Z96652 Presence of left artificial knee joint: Secondary | ICD-10-CM | POA: Diagnosis not present

## 2015-03-22 DIAGNOSIS — M6281 Muscle weakness (generalized): Secondary | ICD-10-CM | POA: Diagnosis not present

## 2015-03-22 DIAGNOSIS — R262 Difficulty in walking, not elsewhere classified: Secondary | ICD-10-CM | POA: Diagnosis not present

## 2015-03-22 NOTE — Telephone Encounter (Signed)
Would not resume lisinopril yet. Continue to monitor BP three times per week for next few weeks and can then reconsider.

## 2015-03-22 NOTE — Telephone Encounter (Signed)
Wife Collie Siad) notified.

## 2015-03-22 NOTE — Telephone Encounter (Signed)
Wife Collie Siad) calling with update on husband's BP.  Stated she did speak with Dr. Bronson Ing this weekend & he suggested that he not take the Lisinopril.  Also, take pain / muscle relaxer meds every 6 hours instead of every 4 hours.   BP yest - 140/77 & BP this morning - 149/69  94.  She now questions if he should resume the Lisinopril.

## 2015-03-24 DIAGNOSIS — M6281 Muscle weakness (generalized): Secondary | ICD-10-CM | POA: Diagnosis not present

## 2015-03-24 DIAGNOSIS — Z96652 Presence of left artificial knee joint: Secondary | ICD-10-CM | POA: Diagnosis not present

## 2015-03-24 DIAGNOSIS — R262 Difficulty in walking, not elsewhere classified: Secondary | ICD-10-CM | POA: Diagnosis not present

## 2015-03-26 DIAGNOSIS — Z96652 Presence of left artificial knee joint: Secondary | ICD-10-CM | POA: Diagnosis not present

## 2015-03-26 DIAGNOSIS — R262 Difficulty in walking, not elsewhere classified: Secondary | ICD-10-CM | POA: Diagnosis not present

## 2015-03-26 DIAGNOSIS — M6281 Muscle weakness (generalized): Secondary | ICD-10-CM | POA: Diagnosis not present

## 2015-03-29 DIAGNOSIS — M6281 Muscle weakness (generalized): Secondary | ICD-10-CM | POA: Diagnosis not present

## 2015-03-29 DIAGNOSIS — Z96652 Presence of left artificial knee joint: Secondary | ICD-10-CM | POA: Diagnosis not present

## 2015-03-29 DIAGNOSIS — R262 Difficulty in walking, not elsewhere classified: Secondary | ICD-10-CM | POA: Diagnosis not present

## 2015-04-02 DIAGNOSIS — Z96652 Presence of left artificial knee joint: Secondary | ICD-10-CM | POA: Diagnosis not present

## 2015-04-02 DIAGNOSIS — M6281 Muscle weakness (generalized): Secondary | ICD-10-CM | POA: Diagnosis not present

## 2015-04-02 DIAGNOSIS — R262 Difficulty in walking, not elsewhere classified: Secondary | ICD-10-CM | POA: Diagnosis not present

## 2015-04-05 DIAGNOSIS — Z96652 Presence of left artificial knee joint: Secondary | ICD-10-CM | POA: Diagnosis not present

## 2015-04-05 DIAGNOSIS — R262 Difficulty in walking, not elsewhere classified: Secondary | ICD-10-CM | POA: Diagnosis not present

## 2015-04-05 DIAGNOSIS — M6281 Muscle weakness (generalized): Secondary | ICD-10-CM | POA: Diagnosis not present

## 2015-04-07 DIAGNOSIS — M6281 Muscle weakness (generalized): Secondary | ICD-10-CM | POA: Diagnosis not present

## 2015-04-07 DIAGNOSIS — R262 Difficulty in walking, not elsewhere classified: Secondary | ICD-10-CM | POA: Diagnosis not present

## 2015-04-07 DIAGNOSIS — Z96652 Presence of left artificial knee joint: Secondary | ICD-10-CM | POA: Diagnosis not present

## 2015-04-09 DIAGNOSIS — M6281 Muscle weakness (generalized): Secondary | ICD-10-CM | POA: Diagnosis not present

## 2015-04-09 DIAGNOSIS — R262 Difficulty in walking, not elsewhere classified: Secondary | ICD-10-CM | POA: Diagnosis not present

## 2015-04-09 DIAGNOSIS — Z96652 Presence of left artificial knee joint: Secondary | ICD-10-CM | POA: Diagnosis not present

## 2015-04-12 ENCOUNTER — Encounter: Payer: Self-pay | Admitting: Family Medicine

## 2015-04-12 ENCOUNTER — Ambulatory Visit (INDEPENDENT_AMBULATORY_CARE_PROVIDER_SITE_OTHER): Payer: Medicare Other | Admitting: Family Medicine

## 2015-04-12 VITALS — BP 150/72 | HR 80 | Temp 98.0°F | Resp 16 | Wt 177.0 lb

## 2015-04-12 DIAGNOSIS — I10 Essential (primary) hypertension: Secondary | ICD-10-CM | POA: Diagnosis not present

## 2015-04-12 DIAGNOSIS — E119 Type 2 diabetes mellitus without complications: Secondary | ICD-10-CM

## 2015-04-12 DIAGNOSIS — E785 Hyperlipidemia, unspecified: Secondary | ICD-10-CM

## 2015-04-12 LAB — COMPLETE METABOLIC PANEL WITH GFR
ALT: 13 U/L (ref 9–46)
AST: 14 U/L (ref 10–35)
Albumin: 4.1 g/dL (ref 3.6–5.1)
Alkaline Phosphatase: 47 U/L (ref 40–115)
BUN: 18 mg/dL (ref 7–25)
CO2: 26 mmol/L (ref 20–31)
Calcium: 10.1 mg/dL (ref 8.6–10.3)
Chloride: 104 mmol/L (ref 98–110)
Creat: 0.95 mg/dL (ref 0.70–1.18)
GFR, Est African American: >89 mL/min (ref >=60)
GFR, Est Non African American: 77 mL/min (ref >=60)
Glucose, Bld: 121 mg/dL — ABNORMAL HIGH (ref 70–99)
Potassium: 4.5 mmol/L (ref 3.5–5.3)
Sodium: 141 mmol/L (ref 135–146)
Total Bilirubin: 0.6 mg/dL (ref 0.2–1.2)
Total Protein: 7.1 g/dL (ref 6.1–8.1)

## 2015-04-12 LAB — LIPID PANEL
Cholesterol: 134 mg/dL (ref 125–200)
HDL: 49 mg/dL (ref >=40)
LDL Cholesterol: 73 mg/dL (ref <130)
Total CHOL/HDL Ratio: 2.7 Ratio (ref <=5.0)
Triglycerides: 59 mg/dL (ref <150)
VLDL: 12 mg/dL (ref <30)
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
## 2015-04-13 ENCOUNTER — Telehealth: Payer: Self-pay | Admitting: *Deleted

## 2015-04-13 LAB — MICROALBUMIN, URINE: Microalb, Ur: 1.4 mg/dL (ref <2.0)

## 2015-04-13 LAB — HEMOGLOBIN A1C
Hgb A1c MFr Bld: 6.4 % — ABNORMAL HIGH (ref <5.7)
Mean Plasma Glucose: 137 mg/dL — ABNORMAL HIGH (ref <117)

## 2015-04-13 NOTE — Telephone Encounter (Signed)
LM of "outstanding" labs and to start taking his Lisinopril again and phone in his B/P in 2 weeks.

## 2015-04-15 DIAGNOSIS — M6281 Muscle weakness (generalized): Secondary | ICD-10-CM | POA: Diagnosis not present

## 2015-04-15 DIAGNOSIS — Z96652 Presence of left artificial knee joint: Secondary | ICD-10-CM | POA: Diagnosis not present

## 2015-04-15 DIAGNOSIS — R262 Difficulty in walking, not elsewhere classified: Secondary | ICD-10-CM | POA: Diagnosis not present

## 2015-04-16 DIAGNOSIS — R262 Difficulty in walking, not elsewhere classified: Secondary | ICD-10-CM | POA: Diagnosis not present

## 2015-04-16 DIAGNOSIS — M6281 Muscle weakness (generalized): Secondary | ICD-10-CM | POA: Diagnosis not present

## 2015-04-16 DIAGNOSIS — Z96652 Presence of left artificial knee joint: Secondary | ICD-10-CM | POA: Diagnosis not present

## 2015-04-20 DIAGNOSIS — M6281 Muscle weakness (generalized): Secondary | ICD-10-CM | POA: Diagnosis not present

## 2015-04-20 DIAGNOSIS — R262 Difficulty in walking, not elsewhere classified: Secondary | ICD-10-CM | POA: Diagnosis not present

## 2015-04-20 DIAGNOSIS — Z96652 Presence of left artificial knee joint: Secondary | ICD-10-CM | POA: Diagnosis not present

## 2015-04-21 DIAGNOSIS — R262 Difficulty in walking, not elsewhere classified: Secondary | ICD-10-CM | POA: Diagnosis not present

## 2015-04-21 DIAGNOSIS — M6281 Muscle weakness (generalized): Secondary | ICD-10-CM | POA: Diagnosis not present

## 2015-04-21 DIAGNOSIS — Z96652 Presence of left artificial knee joint: Secondary | ICD-10-CM | POA: Diagnosis not present

## 2015-04-22 DIAGNOSIS — Z96652 Presence of left artificial knee joint: Secondary | ICD-10-CM | POA: Diagnosis not present

## 2015-04-22 DIAGNOSIS — R262 Difficulty in walking, not elsewhere classified: Secondary | ICD-10-CM | POA: Diagnosis not present

## 2015-04-22 DIAGNOSIS — M6281 Muscle weakness (generalized): Secondary | ICD-10-CM | POA: Diagnosis not present

## 2015-04-30 ENCOUNTER — Encounter: Payer: Medicare Other | Admitting: Internal Medicine

## 2015-05-18 DIAGNOSIS — R339 Retention of urine, unspecified: Secondary | ICD-10-CM | POA: Diagnosis not present

## 2015-05-18 DIAGNOSIS — N320 Bladder-neck obstruction: Secondary | ICD-10-CM | POA: Diagnosis not present

## 2015-05-19 DIAGNOSIS — H01003 Unspecified blepharitis right eye, unspecified eyelid: Secondary | ICD-10-CM | POA: Diagnosis not present

## 2015-05-19 DIAGNOSIS — H25013 Cortical age-related cataract, bilateral: Secondary | ICD-10-CM | POA: Diagnosis not present

## 2015-05-19 DIAGNOSIS — H04123 Dry eye syndrome of bilateral lacrimal glands: Secondary | ICD-10-CM | POA: Diagnosis not present

## 2015-05-19 DIAGNOSIS — H2513 Age-related nuclear cataract, bilateral: Secondary | ICD-10-CM | POA: Diagnosis not present

## 2015-06-04 ENCOUNTER — Encounter: Payer: Self-pay | Admitting: Internal Medicine

## 2015-06-04 ENCOUNTER — Ambulatory Visit (INDEPENDENT_AMBULATORY_CARE_PROVIDER_SITE_OTHER): Payer: Medicare Other | Admitting: Internal Medicine

## 2015-06-04 VITALS — BP 157/78 | HR 70 | Ht 72.0 in | Wt 177.4 lb

## 2015-06-04 DIAGNOSIS — I442 Atrioventricular block, complete: Secondary | ICD-10-CM

## 2015-06-04 DIAGNOSIS — Z950 Presence of cardiac pacemaker: Secondary | ICD-10-CM

## 2015-06-04 DIAGNOSIS — I10 Essential (primary) hypertension: Secondary | ICD-10-CM

## 2015-06-04 LAB — CUP PACEART INCLINIC DEVICE CHECK
Battery Remaining Longevity: 140.4 mo
Battery Voltage: 3.02 V
Brady Statistic RA Percent Paced: 7.2 %
Brady Statistic RV Percent Paced: 0 %
Date Time Interrogation Session: 20161007095156
Lead Channel Impedance Value: 475 Ohm
Lead Channel Impedance Value: 662.5 Ohm
Lead Channel Pacing Threshold Amplitude: 0.5 V
Lead Channel Pacing Threshold Amplitude: 1 V
Lead Channel Pacing Threshold Pulse Width: 0.5 ms
Lead Channel Pacing Threshold Pulse Width: 0.5 ms
Lead Channel Sensing Intrinsic Amplitude: 12 mV
Lead Channel Sensing Intrinsic Amplitude: 5 mV
Lead Channel Setting Pacing Amplitude: 2 V
Lead Channel Setting Pacing Amplitude: 2.5 V
Lead Channel Setting Pacing Pulse Width: 0.5 ms
Lead Channel Setting Sensing Sensitivity: 2 mV
Pulse Gen Model: 2240
Pulse Gen Serial Number: 7618665

## 2015-06-04 MED ORDER — LISINOPRIL 5 MG PO TABS: 5.0000 mg | ORAL_TABLET | Freq: Every day | ORAL | Status: DC

## 2015-06-04 NOTE — Patient Instructions (Addendum)
Your physician has recommended you make the following change in your medication:  Restart your lisinopril 5 mg daily. Continue all other medications the same. Device check on 09/06/15. Your physician recommends that you schedule a follow-up appointment in: 1 year with Dr. Rayann Heman. You will receive a reminder letter in the mail in about 10 months reminding you to call and schedule your appointment. If you don't receive this letter, please contact our office.
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
## 2015-06-11 ENCOUNTER — Encounter: Payer: Medicare Other | Admitting: Internal Medicine

## 2015-06-18 ENCOUNTER — Encounter: Payer: Self-pay | Admitting: Internal Medicine

## 2015-06-22 DIAGNOSIS — M25562 Pain in left knee: Secondary | ICD-10-CM | POA: Diagnosis not present

## 2015-06-22 DIAGNOSIS — Z96652 Presence of left artificial knee joint: Secondary | ICD-10-CM | POA: Insufficient documentation

## 2015-07-27 ENCOUNTER — Ambulatory Visit (INDEPENDENT_AMBULATORY_CARE_PROVIDER_SITE_OTHER): Payer: Medicare Other | Admitting: Family Medicine

## 2015-07-27 DIAGNOSIS — Z23 Encounter for immunization: Secondary | ICD-10-CM

## 2015-08-24 ENCOUNTER — Ambulatory Visit (INDEPENDENT_AMBULATORY_CARE_PROVIDER_SITE_OTHER): Payer: Medicare Other | Admitting: Cardiovascular Disease

## 2015-08-24 ENCOUNTER — Encounter: Payer: Self-pay | Admitting: Cardiovascular Disease

## 2015-08-24 VITALS — BP 130/70 | HR 78 | Ht 72.0 in | Wt 184.0 lb

## 2015-08-24 DIAGNOSIS — E785 Hyperlipidemia, unspecified: Secondary | ICD-10-CM

## 2015-08-24 DIAGNOSIS — I10 Essential (primary) hypertension: Secondary | ICD-10-CM

## 2015-08-24 DIAGNOSIS — Z950 Presence of cardiac pacemaker: Secondary | ICD-10-CM | POA: Diagnosis not present
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
## 2015-08-24 NOTE — Patient Instructions (Signed)
Continue all current medications. Continue to follow up with Dr. Rayann Heman. Follow up as needed.

## 2015-08-31 DIAGNOSIS — Z96652 Presence of left artificial knee joint: Secondary | ICD-10-CM | POA: Diagnosis not present

## 2015-09-06 ENCOUNTER — Ambulatory Visit (INDEPENDENT_AMBULATORY_CARE_PROVIDER_SITE_OTHER): Payer: Medicare Other | Admitting: *Deleted

## 2015-09-06 DIAGNOSIS — I442 Atrioventricular block, complete: Secondary | ICD-10-CM

## 2015-09-06 NOTE — Progress Notes (Signed)
Remote pacemaker transmission.   

## 2015-09-13 DIAGNOSIS — M6281 Muscle weakness (generalized): Secondary | ICD-10-CM | POA: Diagnosis not present

## 2015-09-13 DIAGNOSIS — Z96652 Presence of left artificial knee joint: Secondary | ICD-10-CM | POA: Diagnosis not present

## 2015-09-16 DIAGNOSIS — Z96652 Presence of left artificial knee joint: Secondary | ICD-10-CM | POA: Diagnosis not present

## 2015-09-16 DIAGNOSIS — M6281 Muscle weakness (generalized): Secondary | ICD-10-CM | POA: Diagnosis not present

## 2015-09-20 DIAGNOSIS — Z96652 Presence of left artificial knee joint: Secondary | ICD-10-CM | POA: Diagnosis not present

## 2015-09-20 DIAGNOSIS — M6281 Muscle weakness (generalized): Secondary | ICD-10-CM | POA: Diagnosis not present

## 2015-09-23 DIAGNOSIS — Z96652 Presence of left artificial knee joint: Secondary | ICD-10-CM | POA: Diagnosis not present

## 2015-09-23 DIAGNOSIS — M6281 Muscle weakness (generalized): Secondary | ICD-10-CM | POA: Diagnosis not present

## 2015-09-26 LAB — CUP PACEART REMOTE DEVICE CHECK
Battery Remaining Longevity: 126 mo
Battery Remaining Percentage: 95.5 %
Battery Voltage: 3.02 V
Brady Statistic AP VP Percent: 1 %
Brady Statistic AP VS Percent: 1.1 %
Brady Statistic AS VP Percent: 1 %
Brady Statistic AS VS Percent: 99 %
Brady Statistic RA Percent Paced: 1 %
Brady Statistic RV Percent Paced: 1 %
Date Time Interrogation Session: 20170109070013
Implantable Lead Implant Date: 20150501
Implantable Lead Implant Date: 20150501
Implantable Lead Location: 753859
Implantable Lead Location: 753860
Implantable Lead Model: 1948
Lead Channel Impedance Value: 490 Ohm
Lead Channel Impedance Value: 640 Ohm
Lead Channel Pacing Threshold Amplitude: 0.5 V
Lead Channel Pacing Threshold Amplitude: 1 V
Lead Channel Pacing Threshold Pulse Width: 0.5 ms
Lead Channel Pacing Threshold Pulse Width: 0.5 ms
Lead Channel Sensing Intrinsic Amplitude: 12 mV
Lead Channel Sensing Intrinsic Amplitude: 5 mV
Lead Channel Setting Pacing Amplitude: 2 V
Lead Channel Setting Pacing Amplitude: 2.5 V
Lead Channel Setting Pacing Pulse Width: 0.5 ms
Lead Channel Setting Sensing Sensitivity: 2 mV
Pulse Gen Model: 2240
Pulse Gen Serial Number: 7618665

## 2015-09-27 DIAGNOSIS — Z96652 Presence of left artificial knee joint: Secondary | ICD-10-CM | POA: Diagnosis not present

## 2015-09-27 DIAGNOSIS — M6281 Muscle weakness (generalized): Secondary | ICD-10-CM | POA: Diagnosis not present

## 2015-09-29 ENCOUNTER — Encounter: Payer: Self-pay | Admitting: Cardiology

## 2015-09-30 DIAGNOSIS — M6281 Muscle weakness (generalized): Secondary | ICD-10-CM | POA: Diagnosis not present

## 2015-09-30 DIAGNOSIS — Z96652 Presence of left artificial knee joint: Secondary | ICD-10-CM | POA: Diagnosis not present

## 2015-10-04 DIAGNOSIS — Z96652 Presence of left artificial knee joint: Secondary | ICD-10-CM | POA: Diagnosis not present

## 2015-10-04 DIAGNOSIS — M6281 Muscle weakness (generalized): Secondary | ICD-10-CM | POA: Diagnosis not present

## 2015-10-07 DIAGNOSIS — M6281 Muscle weakness (generalized): Secondary | ICD-10-CM | POA: Diagnosis not present

## 2015-10-07 DIAGNOSIS — Z96652 Presence of left artificial knee joint: Secondary | ICD-10-CM | POA: Diagnosis not present

## 2015-10-11 DIAGNOSIS — M6281 Muscle weakness (generalized): Secondary | ICD-10-CM | POA: Diagnosis not present

## 2015-10-11 DIAGNOSIS — Z96652 Presence of left artificial knee joint: Secondary | ICD-10-CM | POA: Diagnosis not present

## 2015-10-14 DIAGNOSIS — M6281 Muscle weakness (generalized): Secondary | ICD-10-CM | POA: Diagnosis not present

## 2015-10-14 DIAGNOSIS — Z96652 Presence of left artificial knee joint: Secondary | ICD-10-CM | POA: Diagnosis not present

## 2015-11-04 DIAGNOSIS — J309 Allergic rhinitis, unspecified: Secondary | ICD-10-CM | POA: Diagnosis not present

## 2015-11-04 DIAGNOSIS — R05 Cough: Secondary | ICD-10-CM | POA: Diagnosis not present

## 2015-11-04 DIAGNOSIS — J019 Acute sinusitis, unspecified: Secondary | ICD-10-CM | POA: Diagnosis not present

## 2015-11-04 DIAGNOSIS — H04209 Unspecified epiphora, unspecified lacrimal gland: Secondary | ICD-10-CM | POA: Diagnosis not present

## 2015-11-09 DIAGNOSIS — Z96652 Presence of left artificial knee joint: Secondary | ICD-10-CM | POA: Diagnosis not present

## 2015-11-09 DIAGNOSIS — Z471 Aftercare following joint replacement surgery: Secondary | ICD-10-CM | POA: Diagnosis not present

## 2015-11-28 DIAGNOSIS — J019 Acute sinusitis, unspecified: Secondary | ICD-10-CM | POA: Diagnosis not present

## 2015-12-06 ENCOUNTER — Ambulatory Visit (INDEPENDENT_AMBULATORY_CARE_PROVIDER_SITE_OTHER): Payer: Medicare Other | Admitting: *Deleted

## 2015-12-06 DIAGNOSIS — I442 Atrioventricular block, complete: Secondary | ICD-10-CM

## 2015-12-06 NOTE — Progress Notes (Signed)
Remote pacemaker transmission.   

## 2015-12-08 ENCOUNTER — Encounter: Payer: Self-pay | Admitting: Internal Medicine

## 2016-01-03 ENCOUNTER — Other Ambulatory Visit: Payer: Self-pay | Admitting: Internal Medicine

## 2016-01-10 LAB — CUP PACEART REMOTE DEVICE CHECK
Battery Remaining Longevity: 127 mo
Battery Remaining Percentage: 95.5 %
Battery Voltage: 3.01 V
Brady Statistic AP VP Percent: 1 %
Brady Statistic AP VS Percent: 1.5 %
Brady Statistic AS VP Percent: 1 %
Brady Statistic AS VS Percent: 98 %
Brady Statistic RA Percent Paced: 1.4 %
Brady Statistic RV Percent Paced: 1 %
Date Time Interrogation Session: 20170406085133
Implantable Lead Implant Date: 20150501
Implantable Lead Implant Date: 20150501
Implantable Lead Location: 753859
Implantable Lead Location: 753860
Implantable Lead Model: 1948
Lead Channel Impedance Value: 480 Ohm
Lead Channel Impedance Value: 650 Ohm
Lead Channel Sensing Intrinsic Amplitude: 12 mV
Lead Channel Sensing Intrinsic Amplitude: 5 mV
Lead Channel Setting Pacing Amplitude: 2 V
Lead Channel Setting Pacing Amplitude: 2.5 V
Lead Channel Setting Pacing Pulse Width: 0.5 ms
Lead Channel Setting Sensing Sensitivity: 2 mV
Pulse Gen Model: 2240
Pulse Gen Serial Number: 7618665

## 2016-01-11 ENCOUNTER — Encounter: Payer: Self-pay | Admitting: Cardiology

## 2016-01-25 DIAGNOSIS — L905 Scar conditions and fibrosis of skin: Secondary | ICD-10-CM | POA: Diagnosis not present

## 2016-01-25 DIAGNOSIS — L821 Other seborrheic keratosis: Secondary | ICD-10-CM | POA: Diagnosis not present

## 2016-01-25 DIAGNOSIS — D225 Melanocytic nevi of trunk: Secondary | ICD-10-CM | POA: Diagnosis not present

## 2016-01-25 DIAGNOSIS — L578 Other skin changes due to chronic exposure to nonionizing radiation: Secondary | ICD-10-CM | POA: Diagnosis not present

## 2016-02-17 DIAGNOSIS — M25562 Pain in left knee: Secondary | ICD-10-CM | POA: Diagnosis not present

## 2016-02-17 DIAGNOSIS — Z96652 Presence of left artificial knee joint: Secondary | ICD-10-CM | POA: Diagnosis not present

## 2016-03-06 ENCOUNTER — Ambulatory Visit (INDEPENDENT_AMBULATORY_CARE_PROVIDER_SITE_OTHER): Payer: Medicare Other | Admitting: *Deleted

## 2016-03-06 DIAGNOSIS — I442 Atrioventricular block, complete: Secondary | ICD-10-CM | POA: Diagnosis not present

## 2016-03-06 NOTE — Progress Notes (Signed)
Remote pacemaker transmission.   

## 2016-03-08 ENCOUNTER — Encounter: Payer: Self-pay | Admitting: Cardiology

## 2016-03-08 LAB — CUP PACEART REMOTE DEVICE CHECK
Battery Remaining Longevity: 127 mo
Battery Remaining Percentage: 95.5 %
Battery Voltage: 3.01 V
Brady Statistic AP VP Percent: 1 %
Brady Statistic AP VS Percent: 2.6 %
Brady Statistic AS VP Percent: 1 %
Brady Statistic AS VS Percent: 97 %
Brady Statistic RA Percent Paced: 2.5 %
Brady Statistic RV Percent Paced: 1 %
Date Time Interrogation Session: 20170710073438
Implantable Lead Implant Date: 20150501
Implantable Lead Implant Date: 20150501
Implantable Lead Location: 753859
Implantable Lead Location: 753860
Implantable Lead Model: 1948
Lead Channel Impedance Value: 490 Ohm
Lead Channel Impedance Value: 650 Ohm
Lead Channel Sensing Intrinsic Amplitude: 12 mV
Lead Channel Sensing Intrinsic Amplitude: 5 mV
Lead Channel Setting Pacing Amplitude: 2 V
Lead Channel Setting Pacing Amplitude: 2.5 V
Lead Channel Setting Pacing Pulse Width: 0.5 ms
Lead Channel Setting Sensing Sensitivity: 2 mV
Pulse Gen Model: 2240
Pulse Gen Serial Number: 7618665

## 2016-03-29 DIAGNOSIS — J300 Vasomotor rhinitis: Secondary | ICD-10-CM | POA: Diagnosis not present

## 2016-05-26 ENCOUNTER — Ambulatory Visit (INDEPENDENT_AMBULATORY_CARE_PROVIDER_SITE_OTHER): Payer: Medicare Other | Admitting: Internal Medicine

## 2016-05-26 ENCOUNTER — Encounter: Payer: Self-pay | Admitting: Internal Medicine

## 2016-05-26 VITALS — BP 126/70 | HR 60 | Ht 72.0 in | Wt 183.8 lb

## 2016-05-26 DIAGNOSIS — Z950 Presence of cardiac pacemaker: Secondary | ICD-10-CM | POA: Diagnosis not present

## 2016-05-26 DIAGNOSIS — I10 Essential (primary) hypertension: Secondary | ICD-10-CM

## 2016-05-26 DIAGNOSIS — I442 Atrioventricular block, complete: Secondary | ICD-10-CM | POA: Diagnosis not present

## 2016-05-26 LAB — CUP PACEART INCLINIC DEVICE CHECK
Battery Remaining Longevity: 141.6
Battery Voltage: 3.01 V
Brady Statistic RA Percent Paced: 3.1 %
Brady Statistic RV Percent Paced: 0 %
Date Time Interrogation Session: 20170929144032
Implantable Lead Implant Date: 20150501
Implantable Lead Implant Date: 20150501
Implantable Lead Location: 753859
Implantable Lead Location: 753860
Implantable Lead Model: 1948
Lead Channel Impedance Value: 512.5 Ohm
Lead Channel Impedance Value: 662.5 Ohm
Lead Channel Pacing Threshold Amplitude: 0.5 V
Lead Channel Pacing Threshold Amplitude: 1 V
Lead Channel Pacing Threshold Pulse Width: 0.5 ms
Lead Channel Pacing Threshold Pulse Width: 0.5 ms
Lead Channel Sensing Intrinsic Amplitude: 12 mV
Lead Channel Sensing Intrinsic Amplitude: 5 mV
Lead Channel Setting Pacing Amplitude: 2 V
Lead Channel Setting Pacing Amplitude: 2.5 V
Lead Channel Setting Pacing Pulse Width: 0.5 ms
Lead Channel Setting Sensing Sensitivity: 2 mV
Pulse Gen Model: 2240
Pulse Gen Serial Number: 7618665
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
## 2016-05-26 NOTE — Addendum Note (Signed)
Addended by: Laurine Blazer on: 05/26/2016 12:13 PM   Modules accepted: Orders

## 2016-05-26 NOTE — Patient Instructions (Signed)

## 2016-06-02 ENCOUNTER — Encounter: Payer: Medicare Other | Admitting: Internal Medicine

## 2016-06-09 DIAGNOSIS — N401 Enlarged prostate with lower urinary tract symptoms: Secondary | ICD-10-CM | POA: Diagnosis not present

## 2016-06-09 DIAGNOSIS — R351 Nocturia: Secondary | ICD-10-CM | POA: Diagnosis not present

## 2016-06-09 DIAGNOSIS — R972 Elevated prostate specific antigen [PSA]: Secondary | ICD-10-CM | POA: Diagnosis not present

## 2016-06-09 DIAGNOSIS — R3912 Poor urinary stream: Secondary | ICD-10-CM | POA: Diagnosis not present

## 2016-07-27 ENCOUNTER — Ambulatory Visit (INDEPENDENT_AMBULATORY_CARE_PROVIDER_SITE_OTHER): Payer: Medicare Other

## 2016-07-27 DIAGNOSIS — Z23 Encounter for immunization: Secondary | ICD-10-CM | POA: Diagnosis not present

## 2016-08-28 DIAGNOSIS — I10 Essential (primary) hypertension: Secondary | ICD-10-CM | POA: Diagnosis not present

## 2016-08-28 DIAGNOSIS — J019 Acute sinusitis, unspecified: Secondary | ICD-10-CM | POA: Diagnosis not present

## 2016-08-29 ENCOUNTER — Telehealth: Payer: Self-pay | Admitting: Cardiology

## 2016-08-29 ENCOUNTER — Ambulatory Visit (INDEPENDENT_AMBULATORY_CARE_PROVIDER_SITE_OTHER): Payer: Medicare Other | Admitting: *Deleted

## 2016-08-29 DIAGNOSIS — I442 Atrioventricular block, complete: Secondary | ICD-10-CM | POA: Diagnosis not present

## 2016-08-29 NOTE — Telephone Encounter (Signed)
Spoke with pt and reminded pt of remote transmission that is due today. Pt verbalized understanding.   

## 2016-09-01 NOTE — Progress Notes (Signed)
Remote pacemaker transmission.   

## 2016-09-04 DIAGNOSIS — J019 Acute sinusitis, unspecified: Secondary | ICD-10-CM | POA: Diagnosis not present

## 2016-09-04 DIAGNOSIS — I10 Essential (primary) hypertension: Secondary | ICD-10-CM | POA: Diagnosis not present

## 2016-09-06 ENCOUNTER — Encounter: Payer: Self-pay | Admitting: Cardiology

## 2016-09-06 LAB — CUP PACEART REMOTE DEVICE CHECK
Battery Remaining Longevity: 127 mo
Battery Remaining Percentage: 95.5 %
Battery Voltage: 3.01 V
Brady Statistic AP VP Percent: 1 %
Brady Statistic AP VS Percent: 4.3 %
Brady Statistic AS VP Percent: 1 %
Brady Statistic AS VS Percent: 96 %
Brady Statistic RA Percent Paced: 4.2 %
Brady Statistic RV Percent Paced: 1 %
Date Time Interrogation Session: 20180105070015
Implantable Lead Implant Date: 20150501
Implantable Lead Implant Date: 20150501
Implantable Lead Location: 753859
Implantable Lead Location: 753860
Implantable Lead Model: 1948
Implantable Pulse Generator Implant Date: 20150501
Lead Channel Impedance Value: 460 Ohm
Lead Channel Impedance Value: 640 Ohm
Lead Channel Pacing Threshold Amplitude: 0.5 V
Lead Channel Pacing Threshold Amplitude: 1 V
Lead Channel Pacing Threshold Pulse Width: 0.5 ms
Lead Channel Pacing Threshold Pulse Width: 0.5 ms
Lead Channel Sensing Intrinsic Amplitude: 12 mV
Lead Channel Sensing Intrinsic Amplitude: 5 mV
Lead Channel Setting Pacing Amplitude: 2 V
Lead Channel Setting Pacing Amplitude: 2.5 V
Lead Channel Setting Pacing Pulse Width: 0.5 ms
Lead Channel Setting Sensing Sensitivity: 2 mV
Pulse Gen Model: 2240
Pulse Gen Serial Number: 7618665

## 2016-11-02 ENCOUNTER — Telehealth: Payer: Self-pay

## 2016-11-02 DIAGNOSIS — R351 Nocturia: Secondary | ICD-10-CM | POA: Diagnosis not present

## 2016-11-02 DIAGNOSIS — R3912 Poor urinary stream: Secondary | ICD-10-CM | POA: Diagnosis not present

## 2016-11-02 DIAGNOSIS — R3914 Feeling of incomplete bladder emptying: Secondary | ICD-10-CM | POA: Diagnosis not present

## 2016-11-02 NOTE — Telephone Encounter (Signed)
Attempts to contact patient with recall letters. Unable to reach by telephone. with no success.   Edgar Evans [[id number]] 01/04/2016 10:27 AM New [10]    [System] 05/28/2016 11:01 PM Notification Sent [20]   Edgar Evans [[id number]] 10/10/2016 10:52 AM Notification Sent [20]   Edgar Evans [[id number]] 11/02/2016 3:17 PM Notification Sent [20]  Scheduling Instructions  6 month

## 2016-11-09 DIAGNOSIS — R3912 Poor urinary stream: Secondary | ICD-10-CM | POA: Diagnosis not present

## 2016-11-09 DIAGNOSIS — N401 Enlarged prostate with lower urinary tract symptoms: Secondary | ICD-10-CM | POA: Diagnosis not present

## 2016-11-09 DIAGNOSIS — R3914 Feeling of incomplete bladder emptying: Secondary | ICD-10-CM | POA: Diagnosis not present

## 2016-11-13 ENCOUNTER — Telehealth: Payer: Self-pay | Admitting: Internal Medicine

## 2016-11-13 NOTE — Telephone Encounter (Signed)
Mrs. Bostwick is returning a call. Please call

## 2016-11-13 NOTE — Telephone Encounter (Signed)
LM to return call.

## 2016-11-13 NOTE — Telephone Encounter (Signed)
New Message  Patient c/o Palpitations:  High priority if patient c/o lightheadedness and shortness of breath.  1. How long have you been having palpitations? 3-4 days  2. Are you currently experiencing lightheadedness and shortness of breath? Just heart racing  3. Have you checked your BP and heart rate? (document readings) No  4. Are you experiencing any other symptoms? No

## 2016-11-13 NOTE — Telephone Encounter (Signed)
Please offer office visit if symptoms persist.

## 2016-11-13 NOTE — Telephone Encounter (Signed)
Pt c/o "heart beats fast when changing clothes" says started noticing palps around 2 weeks ago - says urologist started alfuzosin 10 mg at night recently - denies chest pain/SOB/dizziness/weight gain/swelling - says he stays very active and was concerned with palpitations wanted to let Dr Rayann Heman know

## 2016-11-14 NOTE — Telephone Encounter (Signed)
Wife notified of appointment schedule for Tuesday 11/21/16 at 3pm

## 2016-11-20 ENCOUNTER — Encounter: Payer: Self-pay | Admitting: *Deleted

## 2016-11-21 ENCOUNTER — Encounter: Payer: Self-pay | Admitting: Cardiovascular Disease

## 2016-11-21 ENCOUNTER — Ambulatory Visit (INDEPENDENT_AMBULATORY_CARE_PROVIDER_SITE_OTHER): Payer: Medicare Other | Admitting: Cardiovascular Disease

## 2016-11-21 VITALS — BP 136/72 | HR 76 | Ht 72.0 in | Wt 197.0 lb

## 2016-11-21 DIAGNOSIS — Z950 Presence of cardiac pacemaker: Secondary | ICD-10-CM

## 2016-11-21 DIAGNOSIS — R002 Palpitations: Secondary | ICD-10-CM | POA: Diagnosis not present

## 2016-11-21 DIAGNOSIS — E7800 Pure hypercholesterolemia, unspecified: Secondary | ICD-10-CM | POA: Diagnosis not present

## 2016-11-21 DIAGNOSIS — I10 Essential (primary) hypertension: Secondary | ICD-10-CM | POA: Diagnosis not present

## 2016-11-21 NOTE — Patient Instructions (Signed)
Your physician recommends that you schedule a follow-up appointment AS NEEDED WITH DR. Jacinta Shoe. CONTINUE TO FOLLOW UP REGULARLY WITH DR. ALLRED.  Your physician recommends that you continue on your current medications as directed. Please refer to the Current Medication list given to you today.  IF YOU NEED A REFILL ON ANY OF YOUR CARDIAC MEDICATIONS PLEASE LET YOUR PHARMACY KNOW.  Thank you for choosing [facility]!
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
## 2016-11-23 ENCOUNTER — Telehealth: Payer: Self-pay | Admitting: *Deleted

## 2016-11-23 NOTE — Telephone Encounter (Signed)
Spoke with patient regarding his previously reported palpitations.  Patient states that his palpitations are improved since the alfuzosin was d/c, but he still has them occasionally for short periods of time.  Merlin transmission received from patient's PPM noting high ventricular rates.  EGMs suggest 1:1 SVT and conducted AT, longest 27min 29sec on 10/30/16, most recent episode on 11/22/16 for 17 sec.  Advised patient that I will review with Dr. Rayann Heman for any additional recommendations when he is back in the office.  Patient verbalizes understanding and appreciation.  He denies additional questions or concerns at this time.  Episode placed in Dr. Jackalyn Lombard folder for review.

## 2016-11-30 ENCOUNTER — Ambulatory Visit (INDEPENDENT_AMBULATORY_CARE_PROVIDER_SITE_OTHER): Payer: Medicare Other | Admitting: Physician Assistant

## 2016-11-30 ENCOUNTER — Encounter: Payer: Self-pay | Admitting: Physician Assistant

## 2016-11-30 VITALS — BP 118/60 | HR 66 | Temp 97.3°F | Resp 18 | Wt 194.0 lb

## 2016-11-30 DIAGNOSIS — J988 Other specified respiratory disorders: Secondary | ICD-10-CM

## 2016-11-30 DIAGNOSIS — B9689 Other specified bacterial agents as the cause of diseases classified elsewhere: Secondary | ICD-10-CM

## 2016-11-30 MED ORDER — AZITHROMYCIN 250 MG PO TABS: ORAL_TABLET | ORAL | 0 refills | Status: DC

## 2016-11-30 NOTE — Progress Notes (Signed)
    Patient ID: Edgar Evans MRN: [id number], DOB: 10-Apr-1941, 76 y.o. Date of Encounter: 11/30/2016, 4:43 PM    Chief Complaint:  Chief Complaint  Patient presents with  . Sinusitis     HPI: 76 y.o. year old male presents with above.  He has been having congestion in his head and nose for 1-2 weeks. He also coughs up phlegm at times but it seems to be secondary to drainage down his throat. Does not feel like he is having any congestion in his chest. Has had no significant amount of sore throat. No fevers or chills.     Home Meds:   Outpatient Medications Prior to Visit  Medication Sig Dispense Refill  . fish oil-omega-3 fatty acids 1000 MG capsule Take 1 g by mouth daily.    Marland Kitchen lisinopril (PRINIVIL,ZESTRIL) 10 MG tablet Take 10 mg by mouth daily.    . simvastatin (ZOCOR) 40 MG tablet Take 40 mg by mouth at bedtime.    . vitamin B-12 (CYANOCOBALAMIN) 500 MCG tablet Take 500 mcg by mouth daily.    . vitamin E 1000 UNIT capsule Take 500 Units by mouth daily.     No facility-administered medications prior to visit.     Allergies:  Allergies  Allergen Reactions  . Penicillins Swelling    Tongue  . Tetanus Toxoids Swelling    Tongue      Review of Systems: See HPI for pertinent ROS. All other ROS negative.    Physical Exam: Blood pressure 118/60, pulse 66, temperature 97.3 F (36.3 C), temperature source Oral, resp. rate 18, weight 194 lb (88 kg), SpO2 98 %., Body mass index is 26.31 kg/m. General:  WNWD WM. Appears in no acute distress. HEENT: Normocephalic, atraumatic, eyes without discharge, sclera non-icteric, nares are without discharge. Bilateral auditory canals clear, TM's are without perforation, pearly grey and translucent with reflective cone of light bilaterally. Oral cavity moist, posterior pharynx without exudate, erythema, peritonsillar abscess. No tenderness with percussion to frontal or maxillary sinuses bilaterally.  Neck: Supple. No thyromegaly. No  lymphadenopathy. Lungs: Clear bilaterally to auscultation without wheezes, rales, or rhonchi. Breathing is unlabored. Heart: Regular rhythm. No murmurs, rubs, or gallops. Msk:  Strength and tone normal for age. Extremities/Skin: Warm and dry.  Neuro: Alert and oriented X 3. Moves all extremities spontaneously. Gait is normal. CNII-XII grossly in tact. Psych:  Responds to questions appropriately with a normal affect.     ASSESSMENT AND PLAN:  76 y.o. year old male with  1. Bacterial respiratory infection He has Penicillin allergy. Therefore will use azithromycin. He is to take the azithromycin as directed. Follow-up if symptoms do not resolve within 1 week after completion of this. - azithromycin (ZITHROMAX) 250 MG tablet; Day 1: Take 2 daily. Days 2- 5: Take 1 daily.  Dispense: 6 tablet; Refill: 0   Signed, [address] [place], [place], [facility] 11/30/2016 4:43 PM

## 2016-12-04 ENCOUNTER — Ambulatory Visit (INDEPENDENT_AMBULATORY_CARE_PROVIDER_SITE_OTHER): Payer: Medicare Other | Admitting: *Deleted

## 2016-12-04 DIAGNOSIS — I442 Atrioventricular block, complete: Secondary | ICD-10-CM

## 2016-12-04 NOTE — Progress Notes (Signed)
Remote pacemaker transmission.   

## 2016-12-06 ENCOUNTER — Encounter: Payer: Self-pay | Admitting: Cardiology

## 2016-12-07 NOTE — Telephone Encounter (Signed)
Spoke with patient regarding Dr. Jackalyn Lombard recommendations to follow up with EP APP regarding SVT/AT episodes that are correlated with palpitations.  Patient lives in [place] and prefers appointments at the [place] office.  Discussed with scheduler, patient scheduled for appointment with Dr. Rayann Heman in [place] on 12/19/16 at 12:15pm.  Patient is aware and appreciative.  He denies any additional questions or concerns at this time.

## 2016-12-08 LAB — CUP PACEART REMOTE DEVICE CHECK
Battery Remaining Longevity: 127 mo
Battery Remaining Percentage: 95.5 %
Battery Voltage: 3.01 V
Brady Statistic AP VP Percent: 1 %
Brady Statistic AP VS Percent: 3.2 %
Brady Statistic AS VP Percent: 1 %
Brady Statistic AS VS Percent: 97 %
Brady Statistic RA Percent Paced: 3.1 %
Brady Statistic RV Percent Paced: 1 %
Date Time Interrogation Session: 20180409060024
Implantable Lead Implant Date: 20150501
Implantable Lead Implant Date: 20150501
Implantable Lead Location: 753859
Implantable Lead Location: 753860
Implantable Lead Model: 1948
Implantable Pulse Generator Implant Date: 20150501
Lead Channel Impedance Value: 460 Ohm
Lead Channel Impedance Value: 650 Ohm
Lead Channel Pacing Threshold Amplitude: 0.5 V
Lead Channel Pacing Threshold Amplitude: 1 V
Lead Channel Pacing Threshold Pulse Width: 0.5 ms
Lead Channel Pacing Threshold Pulse Width: 0.5 ms
Lead Channel Sensing Intrinsic Amplitude: 12 mV
Lead Channel Sensing Intrinsic Amplitude: 5 mV
Lead Channel Setting Pacing Amplitude: 2 V
Lead Channel Setting Pacing Amplitude: 2.5 V
Lead Channel Setting Pacing Pulse Width: 0.5 ms
Lead Channel Setting Sensing Sensitivity: 2 mV
Pulse Gen Model: 2240
Pulse Gen Serial Number: 7618665

## 2016-12-19 ENCOUNTER — Encounter: Payer: Self-pay | Admitting: Internal Medicine

## 2016-12-19 ENCOUNTER — Ambulatory Visit (INDEPENDENT_AMBULATORY_CARE_PROVIDER_SITE_OTHER): Payer: Medicare Other | Admitting: Internal Medicine

## 2016-12-19 VITALS — BP 138/68 | HR 51 | Ht 72.0 in | Wt 190.0 lb

## 2016-12-19 DIAGNOSIS — R002 Palpitations: Secondary | ICD-10-CM

## 2016-12-19 DIAGNOSIS — I441 Atrioventricular block, second degree: Secondary | ICD-10-CM

## 2016-12-19 DIAGNOSIS — I10 Essential (primary) hypertension: Secondary | ICD-10-CM | POA: Diagnosis not present

## 2016-12-19 NOTE — Patient Instructions (Signed)
Medication Instructions:  Continue all current medications.  Labwork: none  Testing/Procedures: none  Follow-Up: Your physician wants you to follow up in:  1 year.  You will receive a reminder letter in the mail one-two months in advance.  If you don't receive a letter, please call our office to schedule the follow up appointment - Dr. Allred.   Any Other Special Instructions Will Be Listed Below (If Applicable). Remote monitoring is used to monitor your Pacemaker of ICD from home. This monitoring reduces the number of office visits required to check your device to one time per year. It allows us to keep an eye on the functioning of your device to ensure it is working properly. You are scheduled for a device check from home on 03/20/2017.  You may send your transmission at any time that day. If you have a wireless device, the transmission will be sent automatically. After your physician reviews your transmission, you will receive a postcard with your next transmission date.  If you need a refill on your cardiac medications before your next appointment, please call your pharmacy.  
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
## 2017-01-03 DIAGNOSIS — N401 Enlarged prostate with lower urinary tract symptoms: Secondary | ICD-10-CM | POA: Diagnosis not present

## 2017-01-03 DIAGNOSIS — R3912 Poor urinary stream: Secondary | ICD-10-CM | POA: Diagnosis not present

## 2017-01-03 DIAGNOSIS — R3914 Feeling of incomplete bladder emptying: Secondary | ICD-10-CM | POA: Diagnosis not present

## 2017-01-03 DIAGNOSIS — R350 Frequency of micturition: Secondary | ICD-10-CM | POA: Diagnosis not present

## 2017-01-04 ENCOUNTER — Other Ambulatory Visit: Payer: Self-pay | Admitting: Internal Medicine

## 2017-01-11 DIAGNOSIS — R3914 Feeling of incomplete bladder emptying: Secondary | ICD-10-CM | POA: Diagnosis not present

## 2017-01-11 DIAGNOSIS — R3912 Poor urinary stream: Secondary | ICD-10-CM | POA: Diagnosis not present

## 2017-01-11 DIAGNOSIS — N401 Enlarged prostate with lower urinary tract symptoms: Secondary | ICD-10-CM | POA: Diagnosis not present

## 2017-01-23 ENCOUNTER — Telehealth: Payer: Self-pay | Admitting: Internal Medicine

## 2017-01-23 NOTE — Telephone Encounter (Signed)
New message    Edgar Evans from [facility] is calling. She states they received a clearance but it was on the wrong pt.  Request for surgical clearance:  What type of surgery is being performed? Transurethral recestion of the prostate using thuliam laser 1. When is this surgery scheduled? Has not scheduled yet, pending clearance   2. Are there any medications that need to be held prior to surgery and how long? No, not according to what she has.  3. Name of physician performing surgery? Dr. Diona Fanti   4. What is your office phone and fax number? [phone]

## 2017-01-24 NOTE — Telephone Encounter (Signed)
Will forward to Dr Bronson Ing who is patient's primary cardiologist

## 2017-01-24 NOTE — Telephone Encounter (Signed)
Can proceed. Low perioperative cardiac risk.

## 2017-01-24 NOTE — Telephone Encounter (Signed)
Sent to [facility]

## 2017-01-26 ENCOUNTER — Other Ambulatory Visit: Payer: Self-pay | Admitting: Urology

## 2017-02-15 NOTE — Patient Instructions (Addendum)
Edgar Evans  02/15/2017   Your procedure is scheduled on: 02-19-17   Report to [facility] Main  Entrance Take [place]  Elevators to 3rd floor to [place] at 7:00 AM.    Call this number if you have problems the morning of surgery [phone]    Remember: ONLY 1 PERSON MAY GO WITH YOU TO SHORT STAY TO GET  READY MORNING OF YOUR SURGERY.  Do not eat food or drink liquids :After Midnight.     Take these medicines the morning of surgery with A SIP OF WATER: None                                You may not have any metal on your body including hair pins and              piercings  Do not wear jewelry, make-up, lotions, powders or perfumes              Men may shave face and neck.   Do not bring valuables to the hospital. [place].  Contacts, dentures or bridgework may not be worn into surgery.     Patients discharged the day of surgery will not be allowed to drive home.  Name and phone number of your driver: Mohit Zirbes [phone]                Please read over the following fact sheets you were given: _____________________________________________________________________             [facility] - Preparing for Surgery Before surgery, you can play an important role.  Because skin is not sterile, your skin needs to be as free of germs as possible.  You can reduce the number of germs on your skin by washing with CHG (chlorahexidine gluconate) soap before surgery.  CHG is an antiseptic cleaner which kills germs and bonds with the skin to continue killing germs even after washing. Please DO NOT use if you have an allergy to CHG or antibacterial soaps.  If your skin becomes reddened/irritated stop using the CHG and inform your nurse when you arrive at Short Stay. Do not shave (including legs and underarms) for at least 48 hours prior to the first CHG shower.  You may shave your face/neck. Please follow  these instructions carefully:  1.  Shower with CHG Soap the night before surgery and the  morning of Surgery.  2.  If you choose to wash your hair, wash your hair first as usual with your  normal  shampoo.  3.  After you shampoo, rinse your hair and body thoroughly to remove the  shampoo.                           4.  Use CHG as you would any other liquid soap.  You can apply chg directly  to the skin and wash                       Gently with a scrungie or clean washcloth.  5.  Apply the CHG Soap to your body ONLY FROM THE NECK DOWN.   Do not use on  face/ open                           Wound or open sores. Avoid contact with eyes, ears mouth and genitals (private parts).                       Wash face,  Genitals (private parts) with your normal soap.             6.  Wash thoroughly, paying special attention to the area where your surgery  will be performed.  7.  Thoroughly rinse your body with warm water from the neck down.  8.  DO NOT shower/wash with your normal soap after using and rinsing off  the CHG Soap.                9.  Pat yourself dry with a clean towel.            10.  Wear clean pajamas.            11.  Place clean sheets on your bed the night of your first shower and do not  sleep with pets. Day of Surgery : Do not apply any lotions/deodorants the morning of surgery.  Please wear clean clothes to the hospital/surgery center.  FAILURE TO FOLLOW THESE INSTRUCTIONS MAY RESULT IN THE CANCELLATION OF YOUR SURGERY PATIENT SIGNATURE_________________________________  NURSE SIGNATURE__________________________________  ________________________________________________________________________

## 2017-02-15 NOTE — Progress Notes (Signed)
05-26-16 (EPIC) EKG   12-04-16 (EPIC) Pacemaker device check 12-19-16 (EPIC) Office Visit

## 2017-02-16 ENCOUNTER — Encounter (HOSPITAL_COMMUNITY)
Admission: RE | Admit: 2017-02-16 | Discharge: 2017-02-16 | Disposition: A | Discharge status: Home / Self Care | Payer: Medicare Other | Source: Ambulatory Visit | Attending: Urology | Admitting: Urology

## 2017-02-16 ENCOUNTER — Encounter (HOSPITAL_COMMUNITY): Payer: Self-pay

## 2017-02-16 DIAGNOSIS — N401 Enlarged prostate with lower urinary tract symptoms: Secondary | ICD-10-CM | POA: Insufficient documentation

## 2017-02-16 DIAGNOSIS — E119 Type 2 diabetes mellitus without complications: Secondary | ICD-10-CM | POA: Insufficient documentation

## 2017-02-16 DIAGNOSIS — Z01818 Encounter for other preprocedural examination: Secondary | ICD-10-CM | POA: Insufficient documentation

## 2017-02-16 LAB — BASIC METABOLIC PANEL
Anion gap: 6 (ref 5–15)
BUN: 25 mg/dL — ABNORMAL HIGH (ref 6–20)
CO2: 28 mmol/L (ref 22–32)
Calcium: 9.4 mg/dL (ref 8.9–10.3)
Chloride: 105 mmol/L (ref 101–111)
Creatinine, Ser: 1.11 mg/dL (ref 0.61–1.24)
GFR calc Af Amer: >60 mL/min (ref >60)
GFR calc non Af Amer: >60 mL/min (ref >60)
Glucose, Bld: 116 mg/dL — ABNORMAL HIGH (ref 65–99)
Potassium: 4.5 mmol/L (ref 3.5–5.1)
Sodium: 139 mmol/L (ref 135–145)

## 2017-02-16 LAB — CBC
HCT: 39.4 % (ref 39.0–52.0)
Hemoglobin: 13.4 g/dL (ref 13.0–17.0)
MCH: 29.3 pg (ref 26.0–34.0)
MCHC: 34 g/dL (ref 30.0–36.0)
MCV: 86.2 fL (ref 78.0–100.0)
Platelets: 174 10*3/uL (ref 150–400)
RBC: 4.57 MIL/uL (ref 4.22–5.81)
RDW: 12.7 % (ref 11.5–15.5)
WBC: 5.2 10*3/uL (ref 4.0–10.5)

## 2017-02-16 NOTE — Progress Notes (Signed)
02-16-17 Pt is ambulatory after surgery per surgery schedule. However, pt is under the impression that he is scheduled to stay overnight. Spoke to [facility], pt is scheduled for bed placement for observation. They will contact pt to explain.

## 2017-02-16 NOTE — Progress Notes (Signed)
02-16-17 BMP result, routed to Dr Diona Fanti for review.

## 2017-02-16 NOTE — Progress Notes (Signed)
02-16-17 HGA1C pending.

## 2017-02-17 LAB — HEMOGLOBIN A1C
Hgb A1c MFr Bld: 7.4 % — ABNORMAL HIGH (ref 4.8–5.6)
Mean Plasma Glucose: 166 mg/dL

## 2017-02-19 ENCOUNTER — Ambulatory Visit (HOSPITAL_COMMUNITY): Payer: Medicare Other | Admitting: Anesthesiology

## 2017-02-19 ENCOUNTER — Encounter (HOSPITAL_COMMUNITY)
Admission: RE | Disposition: A | Discharge status: Home / Self Care | Payer: Self-pay | Source: Ambulatory Visit | Attending: Urology

## 2017-02-19 ENCOUNTER — Ambulatory Visit (HOSPITAL_COMMUNITY)
Admission: RE | Admit: 2017-02-19 | Discharge: 2017-02-20 | Disposition: A | Discharge status: Home / Self Care | Payer: Medicare Other | Source: Ambulatory Visit | Attending: Urology | Admitting: Urology

## 2017-02-19 ENCOUNTER — Encounter (HOSPITAL_COMMUNITY): Payer: Self-pay | Admitting: *Deleted

## 2017-02-19 DIAGNOSIS — E785 Hyperlipidemia, unspecified: Secondary | ICD-10-CM | POA: Insufficient documentation

## 2017-02-19 DIAGNOSIS — Z7982 Long term (current) use of aspirin: Secondary | ICD-10-CM | POA: Diagnosis not present

## 2017-02-19 DIAGNOSIS — E119 Type 2 diabetes mellitus without complications: Secondary | ICD-10-CM | POA: Diagnosis not present

## 2017-02-19 DIAGNOSIS — N401 Enlarged prostate with lower urinary tract symptoms: Secondary | ICD-10-CM | POA: Diagnosis present

## 2017-02-19 DIAGNOSIS — Z96652 Presence of left artificial knee joint: Secondary | ICD-10-CM | POA: Diagnosis not present

## 2017-02-19 DIAGNOSIS — I10 Essential (primary) hypertension: Secondary | ICD-10-CM | POA: Insufficient documentation

## 2017-02-19 DIAGNOSIS — Z950 Presence of cardiac pacemaker: Secondary | ICD-10-CM | POA: Insufficient documentation

## 2017-02-19 DIAGNOSIS — Z79899 Other long term (current) drug therapy: Secondary | ICD-10-CM | POA: Diagnosis not present

## 2017-02-19 DIAGNOSIS — I442 Atrioventricular block, complete: Secondary | ICD-10-CM | POA: Diagnosis not present

## 2017-02-19 DIAGNOSIS — N138 Other obstructive and reflux uropathy: Secondary | ICD-10-CM | POA: Diagnosis present

## 2017-02-19 DIAGNOSIS — Z9079 Acquired absence of other genital organ(s): Secondary | ICD-10-CM | POA: Diagnosis not present

## 2017-02-19 DIAGNOSIS — Z87891 Personal history of nicotine dependence: Secondary | ICD-10-CM | POA: Insufficient documentation

## 2017-02-19 HISTORY — PX: THULIUM LASER TURP (TRANSURETHRAL RESECTION OF PROSTATE): SHX6744

## 2017-02-19 LAB — GLUCOSE, CAPILLARY
Glucose-Capillary: 113 mg/dL — ABNORMAL HIGH (ref 65–99)
Glucose-Capillary: 130 mg/dL — ABNORMAL HIGH (ref 65–99)
Glucose-Capillary: 158 mg/dL — ABNORMAL HIGH (ref 65–99)

## 2017-02-19 SURGERY — THULIUM LASER TURP (TRANSURETHRAL RESECTION OF PROSTATE)
Anesthesia: General

## 2017-02-19 MED ORDER — PHENYLEPHRINE HCL 10 MG/ML IJ SOLN
INTRAMUSCULAR | Status: DC | PRN
  Administered 2017-02-19: 40 ug via INTRAVENOUS
  Administered 2017-02-19: 80 ug via INTRAVENOUS

## 2017-02-19 MED ORDER — IBUPROFEN 200 MG PO TABS: 400.0000 mg | ORAL_TABLET | Freq: Three times a day (TID) | ORAL | Status: DC | PRN

## 2017-02-19 MED ORDER — FENTANYL CITRATE (PF) 100 MCG/2ML IJ SOLN
25.0000 ug | INTRAMUSCULAR | Status: DC | PRN
  Administered 2017-02-19 (×3): 25 ug via INTRAVENOUS

## 2017-02-19 MED ORDER — SENNA 8.6 MG PO TABS
1.0000 | ORAL_TABLET | Freq: Two times a day (BID) | ORAL | Status: DC
  Administered 2017-02-19 (×2): 8.6 mg via ORAL
  Filled 2017-02-19 (×3): qty 1

## 2017-02-19 MED ORDER — VITAMIN B-12 1000 MCG PO TABS
500.0000 ug | ORAL_TABLET | Freq: Every day | ORAL | Status: DC
  Administered 2017-02-19 – 2017-02-20 (×2): 500 ug via ORAL
  Filled 2017-02-19 (×2): qty 1

## 2017-02-19 MED ORDER — FENTANYL CITRATE (PF) 100 MCG/2ML IJ SOLN
INTRAMUSCULAR | Status: DC | PRN
  Administered 2017-02-19: 50 ug via INTRAVENOUS
  Administered 2017-02-19 (×2): 25 ug via INTRAVENOUS

## 2017-02-19 MED ORDER — SODIUM CHLORIDE 0.45 % IV SOLN
INTRAVENOUS | Status: DC
  Administered 2017-02-19: 14:00:00 via INTRAVENOUS

## 2017-02-19 MED ORDER — PHENYLEPHRINE 40 MCG/ML (10ML) SYRINGE FOR IV PUSH (FOR BLOOD PRESSURE SUPPORT)
PREFILLED_SYRINGE | INTRAVENOUS | Status: AC
  Filled 2017-02-19: qty 10

## 2017-02-19 MED ORDER — ONDANSETRON HCL 4 MG/2ML IJ SOLN: 4.0000 mg | INTRAMUSCULAR | Status: DC | PRN

## 2017-02-19 MED ORDER — LISINOPRIL 10 MG PO TABS
10.0000 mg | ORAL_TABLET | Freq: Every day | ORAL | Status: DC
  Administered 2017-02-19: 10 mg via ORAL
  Filled 2017-02-19: qty 1

## 2017-02-19 MED ORDER — ONDANSETRON HCL 4 MG/2ML IJ SOLN
INTRAMUSCULAR | Status: AC
  Filled 2017-02-19: qty 2

## 2017-02-19 MED ORDER — VITAMIN B-12 500 MCG PO TABS: 500.0000 ug | ORAL_TABLET | Freq: Every day | ORAL | Status: DC

## 2017-02-19 MED ORDER — FENTANYL CITRATE (PF) 250 MCG/5ML IJ SOLN
INTRAMUSCULAR | Status: AC
  Filled 2017-02-19: qty 5

## 2017-02-19 MED ORDER — FENTANYL CITRATE (PF) 100 MCG/2ML IJ SOLN
INTRAMUSCULAR | Status: AC
  Filled 2017-02-19: qty 2

## 2017-02-19 MED ORDER — LIDOCAINE HCL (CARDIAC) 20 MG/ML IV SOLN
INTRAVENOUS | Status: DC | PRN
  Administered 2017-02-19: 100 mg via INTRAVENOUS

## 2017-02-19 MED ORDER — BELLADONNA ALKALOIDS-OPIUM 16.2-60 MG RE SUPP: 1.0000 | Freq: Four times a day (QID) | RECTAL | Status: DC | PRN

## 2017-02-19 MED ORDER — SIMVASTATIN 40 MG PO TABS
40.0000 mg | ORAL_TABLET | Freq: Every day | ORAL | Status: DC
  Administered 2017-02-19: 40 mg via ORAL
  Filled 2017-02-19: qty 1

## 2017-02-19 MED ORDER — SODIUM CHLORIDE 0.9 % IR SOLN
3000.0000 mL | Status: DC
  Administered 2017-02-19: 3000 mL

## 2017-02-19 MED ORDER — PROPOFOL 10 MG/ML IV BOLUS
INTRAVENOUS | Status: DC | PRN
  Administered 2017-02-19: 150 mg via INTRAVENOUS

## 2017-02-19 MED ORDER — OXYCODONE HCL 5 MG PO TABS
5.0000 mg | ORAL_TABLET | ORAL | Status: DC | PRN
  Administered 2017-02-19 (×2): 5 mg via ORAL
  Filled 2017-02-19 (×2): qty 1

## 2017-02-19 MED ORDER — CIPROFLOXACIN IN D5W 400 MG/200ML IV SOLN
400.0000 mg | INTRAVENOUS | Status: AC
  Administered 2017-02-19: 400 mg via INTRAVENOUS
  Filled 2017-02-19: qty 200

## 2017-02-19 MED ORDER — SULFAMETHOXAZOLE-TRIMETHOPRIM 800-160 MG PO TABS
1.0000 | ORAL_TABLET | Freq: Two times a day (BID) | ORAL | Status: DC
  Administered 2017-02-19 – 2017-02-20 (×2): 1 via ORAL
  Filled 2017-02-19 (×2): qty 1

## 2017-02-19 MED ORDER — PROPOFOL 10 MG/ML IV BOLUS
INTRAVENOUS | Status: AC
  Filled 2017-02-19: qty 20

## 2017-02-19 MED ORDER — LIDOCAINE 2% (20 MG/ML) 5 ML SYRINGE
INTRAMUSCULAR | Status: AC
  Filled 2017-02-19: qty 5

## 2017-02-19 MED ORDER — ZOLPIDEM TARTRATE 5 MG PO TABS
5.0000 mg | ORAL_TABLET | Freq: Every evening | ORAL | Status: DC | PRN
  Administered 2017-02-19: 5 mg via ORAL
  Filled 2017-02-19: qty 1

## 2017-02-19 MED ORDER — ADULT MULTIVITAMIN W/MINERALS CH
1.0000 | ORAL_TABLET | Freq: Every day | ORAL | Status: DC
  Administered 2017-02-19: 1 via ORAL
  Filled 2017-02-19: qty 1

## 2017-02-19 MED ORDER — ONDANSETRON HCL 4 MG/2ML IJ SOLN
INTRAMUSCULAR | Status: DC | PRN
  Administered 2017-02-19: 4 mg via INTRAVENOUS

## 2017-02-19 MED ORDER — LACTATED RINGERS IV SOLN
INTRAVENOUS | Status: DC
  Administered 2017-02-19: 07:00:00 via INTRAVENOUS

## 2017-02-19 MED ORDER — VITAMIN E 45 MG (100 UNIT) PO CAPS
500.0000 [IU] | ORAL_CAPSULE | Freq: Every day | ORAL | Status: DC
  Administered 2017-02-19: 500 [IU] via ORAL
  Filled 2017-02-19 (×5): qty 1

## 2017-02-19 MED ORDER — SODIUM CHLORIDE 0.9 % IR SOLN
Status: DC | PRN
  Administered 2017-02-19: 15000 mL

## 2017-02-19 SURGICAL SUPPLY — 16 items
BAG URINE DRAINAGE (UROLOGICAL SUPPLIES) ×2 IMPLANT
BAG URO CATCHER STRL LF (MISCELLANEOUS) ×2 IMPLANT
CATH FOLEY 2WAY SLVR 30CC 22FR (CATHETERS) IMPLANT
CATH FOLEY 3WAY 30CC 22FR (CATHETERS) ×1 IMPLANT
COVER SURGICAL LIGHT HANDLE (MISCELLANEOUS) ×2 IMPLANT
GLOVE BIOGEL M 8.0 STRL (GLOVE) ×2 IMPLANT
GOWN STRL REUS W/TWL LRG LVL3 (GOWN DISPOSABLE) ×1 IMPLANT
GOWN STRL REUS W/TWL XL LVL3 (GOWN DISPOSABLE) ×2 IMPLANT
HOLDER FOLEY CATH W/STRAP (MISCELLANEOUS) IMPLANT
LASER REVOLIX PROCEDURE (MISCELLANEOUS) ×2 IMPLANT
MANIFOLD NEPTUNE II (INSTRUMENTS) ×2 IMPLANT
PACK CYSTO (CUSTOM PROCEDURE TRAY) ×2 IMPLANT
PLUG CATH AND CAP STER (CATHETERS) ×2 IMPLANT
SYR 30ML LL (SYRINGE) IMPLANT
SYRINGE IRR TOOMEY STRL 70CC (SYRINGE) ×1 IMPLANT
TUBING CONNECTING 10 (TUBING) ×3 IMPLANT

## 2017-02-19 NOTE — Anesthesia Preprocedure Evaluation (Addendum)
Anesthesia Evaluation  Patient identified by MRN, date of birth, ID band Patient awake    Reviewed: Allergy & Precautions, H&P , NPO status , Patient's Chart, lab work & pertinent test results  Airway Mallampati: II  TM Distance: >3 FB Neck ROM: Full    Dental no notable dental hx. (+) Edentulous Upper, Partial Lower, Dental Advisory Given, Dental Advidsory Given   Pulmonary neg pulmonary ROS, former smoker,    Pulmonary exam normal breath sounds clear to auscultation       Cardiovascular hypertension, Pt. on medications + pacemaker  Rhythm:Regular Rate:Normal     Neuro/Psych  Headaches, negative neurological ROS  negative psych ROS   GI/Hepatic Neg liver ROS, GERD  Medicated and Controlled,  Endo/Other  diabetes  Renal/GU negative Renal ROS  negative genitourinary   Musculoskeletal  (+) Arthritis , Osteoarthritis,    Abdominal   Peds  Hematology negative hematology ROS (+)   Anesthesia Other Findings   Reproductive/Obstetrics negative OB ROS                             Anesthesia Physical  Anesthesia Plan  ASA: II  Anesthesia Plan: General and Regional   Post-op Pain Management:    Induction: Intravenous  PONV Risk Score and Plan:   Airway Management Planned: LMA  Additional Equipment:   Intra-op Plan:   Post-operative Plan: Extubation in OR  Informed Consent: I have reviewed the patients History and Physical, chart, labs and discussed the procedure including the risks, benefits and alternatives for the proposed anesthesia with the patient or authorized representative who has indicated his/her understanding and acceptance.   Dental advisory given and Dental Advisory Given  Plan Discussed with: CRNA and Anesthesiologist  Anesthesia Plan Comments:         Anesthesia Quick Evaluation

## 2017-02-19 NOTE — Interval H&P Note (Signed)
History and Physical Interval Note:  02/19/2017 8:43 AM  Edgar Evans  has presented today for surgery, with the diagnosis of BENIGN PROSTATIC HYPERPLASIA  The various methods of treatment have been discussed with the patient and family. After consideration of risks, benefits and other options for treatment, the patient has consented to  Procedure(s): THULIUM LASER TURP (TRANSURETHRAL RESECTION OF PROSTATE) (N/A) as a surgical intervention .  The patient's history has been reviewed, patient examined, no change in status, stable for surgery.  I have reviewed the patient's chart and labs.  Questions were answered to the patient's satisfaction.     Jorja Loa

## 2017-02-19 NOTE — Transfer of Care (Signed)
Immediate Anesthesia Transfer of Care Note  Patient: [name]  Procedure(s) Performed: Procedure(s): THULIUM LASER TURP (TRANSURETHRAL RESECTION OF PROSTATE) (N/A)  Patient Location: PACU  Anesthesia Type:General  Level of Consciousness: awake, alert  and oriented  Airway & Oxygen Therapy: Patient Spontanous Breathing and Patient connected to face mask oxygen  Post-op Assessment: Report given to RN and Post -op Vital signs reviewed and stable  Post vital signs: Reviewed and stable  Last Vitals:  Vitals:   02/19/17 0639  BP: (!) 136/58  Pulse: 63  Resp: 16  Temp: 36.6 C    Last Pain:  Vitals:   02/19/17 0639  TempSrc: Oral      Patients Stated Pain Goal: 3 (61/60/73 7106)  Complications: No apparent anesthesia complications

## 2017-02-19 NOTE — Anesthesia Postprocedure Evaluation (Addendum)
Anesthesia Post Note  Patient: Edgar Evans  Procedure(s) Performed: Procedure(s) (LRB): THULIUM LASER TURP (TRANSURETHRAL RESECTION OF PROSTATE) (N/A)     Patient location during evaluation: PACU Anesthesia Type: General Level of consciousness: sedated Pain management: satisfactory to patient Vital Signs Assessment: post-procedure vital signs reviewed and stable Respiratory status: spontaneous breathing Cardiovascular status: stable Anesthetic complications: no    Last Vitals:  Vitals:   02/19/17 1237 02/19/17 1422  BP: 130/67 140/68  Pulse: 61 62  Resp: 14 16  Temp: 36.3 C 36.4 C    Last Pain:  Vitals:   02/19/17 1446  TempSrc:   PainSc: 0-No pain                 Korene Dula EDWARD

## 2017-02-19 NOTE — Op Note (Signed)
Preoperative diagnosis: BPH, status post TURP in 2009 with scar regrowth of obstructive tissue and significant symptoms  Postoperative diagnosis: Same  Principal procedure: TURP, using thulium laser  Surgeon: Taber Sweetser  Anesthesia: Gen.  Complications: None  Specimen: None  Estimated blood loss: Less than 25 milliliters  Drains: 22 French three-way Foley catheter to dependent drainage.  Indications: 76 year old male who recently saw me for consideration of a minimally invasive prostatectomy for recurrent voiding symptoms.  He is status post TURP in 2009 by another urologist in my practice.  The patient has had significant obstructive symptomatology with an elevated residual urine volume and he desires management.  I've discussed the procedure of laser prostatectomy versus Urolift with the patient.  Because of irregular regrowth of prostatic tissue, I have recommended laser prostatectomy.  He presents at this time for that procedure.  We have discussed the procedure as well as risks and complications with him.  These include but are not limited to bleeding, infection, difficulty voiding afterwards.  Temporarily, as well as anesthetic complications and regrowth of tissue down the road.  He understands these and desires to proceed.  Description of procedure: The patient was properly identified in the holding area.  He received preoperative IV antibiotics.  He was taken to the operating room where general anesthetic was administered.  He was placed in the dorsolithotomy position.  Genitalia and perineum were prepped and draped.  Proper timeout was performed.  A 22 French cystoscope sheath was advanced into the bladder using the visual obturator.  Inspection revealed obstructive prostate from bilateral lobes.  There was a small median lobe as well.  The bladder was entered and inspected circumferentially.  Urothelium was normal.  Both ureteral orifices were identified and marked to avoid injury  later on.  Following this, the laser obturator was placed.  The 800 micron fiber was advanced through the obturator.  Using energy from the thulium laser, starting at power 80 watts, the left lobe was vaporized, starting at the bladder neck, and then working from a posterior to lateral and anterior position.  In this manner,  moving from the bladder neck to the apex of the prostate, the tissue was vaporized.  The power was advanced to 200 watts during the procedure.  Following adequate vaporization of the entire obstructing left prostatic lobe, the same procedure was done to the right prostatic lobe, again moving from a posterior medial location.  2.  Anterior medial.  Vaporization/ablation was carried down to the apex.  Attention was then turned to the anterior prostatic tissue, which was also vaporized.  In this manner, all obstructing tissue was vaporized.  There was no evidence of bleeding at this point.  The scope was advanced into the bladder, the bladder irrigated free of a small amount of debris, and the scope was then removed.  A 22 French three-way Foley catheter was advanced into the bladder using the catheter guide.  The balloon was filled with 30 mL of water.  This was then hooked to CBI.  At this point, the procedure was terminated.  The patient was awakened and taken to the PACU in stable condition.  He tolerated the procedure well.

## 2017-02-19 NOTE — Anesthesia Procedure Notes (Signed)
Procedure Name: LMA Insertion Date/Time: 02/19/2017 9:08 AM Performed by: Glory Buff Pre-anesthesia Checklist: Patient identified, Emergency Drugs available, Suction available and Patient being monitored Patient Re-evaluated:Patient Re-evaluated prior to inductionOxygen Delivery Method: Circle system utilized Preoxygenation: Pre-oxygenation with 100% oxygen Intubation Type: IV induction Ventilation: Mask ventilation without difficulty LMA: LMA inserted LMA Size: 4.0 Number of attempts: 1 Placement Confirmation: positive ETCO2 Tube secured with: Tape Dental Injury: Teeth and Oropharynx as per pre-operative assessment
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
## 2017-02-20 DIAGNOSIS — E785 Hyperlipidemia, unspecified: Secondary | ICD-10-CM | POA: Diagnosis not present

## 2017-02-20 DIAGNOSIS — N401 Enlarged prostate with lower urinary tract symptoms: Secondary | ICD-10-CM | POA: Diagnosis not present

## 2017-02-20 DIAGNOSIS — Z79899 Other long term (current) drug therapy: Secondary | ICD-10-CM | POA: Diagnosis not present

## 2017-02-20 DIAGNOSIS — N138 Other obstructive and reflux uropathy: Secondary | ICD-10-CM | POA: Diagnosis not present

## 2017-02-20 DIAGNOSIS — I10 Essential (primary) hypertension: Secondary | ICD-10-CM | POA: Diagnosis not present

## 2017-02-20 DIAGNOSIS — E119 Type 2 diabetes mellitus without complications: Secondary | ICD-10-CM | POA: Diagnosis not present

## 2017-02-20 LAB — GLUCOSE, CAPILLARY
Glucose-Capillary: 135 mg/dL — ABNORMAL HIGH (ref 65–99)
Glucose-Capillary: 155 mg/dL — ABNORMAL HIGH (ref 65–99)

## 2017-02-20 MED ORDER — SULFAMETHOXAZOLE-TRIMETHOPRIM 800-160 MG PO TABS: 1.0000 | ORAL_TABLET | Freq: Two times a day (BID) | ORAL | 0 refills | Status: DC

## 2017-02-20 NOTE — Discharge Summary (Signed)
Physician Discharge Summary  Patient ID: Edgar Evans MRN: [id number] DOB/AGE: [birth year] 78 y.o.  Admit date: 02/19/2017 Discharge date: 02/20/2017  Admission Diagnoses:  Discharge Diagnoses:  Active Problems:   Enlarged prostate with urinary obstruction   Discharged Condition: good  Hospital Course: Pt admitted to obs following thulium vaporization of prostate. He has done well. Foley removed this AM and pt voiding without difficulty. Urine clear with mild terminal hematuria (light red per pt). No clots. He's ambulating and tolerated a regular diet.   Consults: None  Significant Diagnostic Studies: none  Treatments: surgery: thulium vaporization of prostate  Discharge Exam: Blood pressure (!) 99/42, pulse 76, temperature 98.4 F (36.9 C), temperature source Oral, resp. rate 16, height 6' (1.829 m), weight 87.1 kg (192 lb), SpO2 95 %. NAD Sitting on edge of bed drinking coffee A&Ox3 No focal neurologic deficits Ext - no swelling   Disposition: 06-Home-Health Care Svc   Allergies as of 02/20/2017      Reactions   Penicillins Swelling   Tongue Has patient had a PCN reaction causing immediate rash, facial/tongue/throat swelling, SOB or lightheadedness with hypotension:Yes Has patient had a PCN reaction causing severe rash involving mucus membranes or skin necrosis:Unknown Has patient had a PCN reaction that required hospitalization:No Has patient had a PCN reaction occurring within the last 10 years:No If all of the above answers are "NO", then may proceed with Cephalosporin use.    Tetanus Toxoids Swelling   Tongue      Medication List    TAKE these medications   aspirin EC 81 MG tablet Take 81 mg by mouth daily.   ibuprofen 200 MG tablet Commonly known as:  ADVIL,MOTRIN Take 400 mg by mouth every 8 (eight) hours as needed (for pain/headaches.).   lisinopril 20 MG tablet Commonly known as:  PRINIVIL,ZESTRIL Take 10 mg by mouth at bedtime.   multivitamin  with minerals Tabs tablet Take 1 tablet by mouth at bedtime. Centrum Silver   simvastatin 40 MG tablet Commonly known as:  ZOCOR Take 40 mg by mouth at bedtime.   sulfamethoxazole-trimethoprim 800-160 MG tablet Commonly known as:  BACTRIM DS,SEPTRA DS Take 1 tablet by mouth 2 (two) times daily.   vitamin B-12 500 MCG tablet Commonly known as:  CYANOCOBALAMIN Take 500 mcg by mouth daily.   vitamin E 1000 UNIT capsule Take 500 Units by mouth daily.      Follow-up Information    Karen Kays, NP Follow up.   Specialty:  Nurse Practitioner Why:  04/02/2017 @ 0915 Contact information: [address] [place] [address] [phone]           Signed: Festus Aloe 02/20/2017, 7:54 AM

## 2017-02-20 NOTE — Discharge Instructions (Signed)
° ° °

## 2017-02-21 ENCOUNTER — Encounter (HOSPITAL_COMMUNITY): Payer: Self-pay | Admitting: Urology

## 2017-02-21 NOTE — Addendum Note (Signed)
Addendum  created 02/21/17 1045 by Lyndle Herrlich, MD   SmartForm saved

## 2017-03-05 ENCOUNTER — Telehealth: Payer: Self-pay | Admitting: Cardiology

## 2017-03-05 ENCOUNTER — Ambulatory Visit (INDEPENDENT_AMBULATORY_CARE_PROVIDER_SITE_OTHER): Payer: Medicare Other | Admitting: *Deleted

## 2017-03-05 DIAGNOSIS — I441 Atrioventricular block, second degree: Secondary | ICD-10-CM | POA: Diagnosis not present

## 2017-03-05 NOTE — Progress Notes (Signed)
Remote pacemaker transmission.   

## 2017-03-05 NOTE — Telephone Encounter (Signed)
Spoke with pt and reminded pt of remote transmission that is due today. Pt verbalized understanding.   

## 2017-03-07 LAB — CUP PACEART REMOTE DEVICE CHECK
Battery Remaining Longevity: 126 mo
Battery Remaining Percentage: 95.5 %
Battery Voltage: 3.01 V
Brady Statistic AP VP Percent: 1 %
Brady Statistic AP VS Percent: 8.2 %
Brady Statistic AS VP Percent: 1 %
Brady Statistic AS VS Percent: 92 %
Brady Statistic RA Percent Paced: 8.1 %
Brady Statistic RV Percent Paced: 1 %
Date Time Interrogation Session: 20180709153942
Implantable Lead Implant Date: 20150501
Implantable Lead Implant Date: 20150501
Implantable Lead Location: 753859
Implantable Lead Location: 753860
Implantable Lead Model: 1948
Implantable Pulse Generator Implant Date: 20150501
Lead Channel Impedance Value: 460 Ohm
Lead Channel Impedance Value: 640 Ohm
Lead Channel Pacing Threshold Amplitude: 0.5 V
Lead Channel Pacing Threshold Amplitude: 0.75 V
Lead Channel Pacing Threshold Pulse Width: 0.5 ms
Lead Channel Pacing Threshold Pulse Width: 0.5 ms
Lead Channel Sensing Intrinsic Amplitude: 12 mV
Lead Channel Sensing Intrinsic Amplitude: 5 mV
Lead Channel Setting Pacing Amplitude: 2 V
Lead Channel Setting Pacing Amplitude: 2.5 V
Lead Channel Setting Pacing Pulse Width: 0.5 ms
Lead Channel Setting Sensing Sensitivity: 2 mV
Pulse Gen Model: 2240
Pulse Gen Serial Number: 7618665

## 2017-03-08 DIAGNOSIS — R350 Frequency of micturition: Secondary | ICD-10-CM | POA: Diagnosis not present

## 2017-03-08 DIAGNOSIS — H2513 Age-related nuclear cataract, bilateral: Secondary | ICD-10-CM | POA: Diagnosis not present

## 2017-03-08 DIAGNOSIS — H04123 Dry eye syndrome of bilateral lacrimal glands: Secondary | ICD-10-CM | POA: Diagnosis not present

## 2017-03-08 DIAGNOSIS — N401 Enlarged prostate with lower urinary tract symptoms: Secondary | ICD-10-CM | POA: Diagnosis not present

## 2017-03-08 DIAGNOSIS — H1013 Acute atopic conjunctivitis, bilateral: Secondary | ICD-10-CM | POA: Diagnosis not present

## 2017-03-08 DIAGNOSIS — H25013 Cortical age-related cataract, bilateral: Secondary | ICD-10-CM | POA: Diagnosis not present

## 2017-03-08 DIAGNOSIS — R3912 Poor urinary stream: Secondary | ICD-10-CM | POA: Diagnosis not present

## 2017-03-08 LAB — HM DIABETES EYE EXAM

## 2017-03-09 ENCOUNTER — Encounter: Payer: Self-pay | Admitting: Cardiology

## 2017-03-15 ENCOUNTER — Encounter: Payer: Self-pay | Admitting: *Deleted

## 2017-04-02 DIAGNOSIS — N401 Enlarged prostate with lower urinary tract symptoms: Secondary | ICD-10-CM | POA: Diagnosis not present

## 2017-04-02 DIAGNOSIS — R3916 Straining to void: Secondary | ICD-10-CM | POA: Diagnosis not present

## 2017-04-02 DIAGNOSIS — R3915 Urgency of urination: Secondary | ICD-10-CM | POA: Diagnosis not present

## 2017-04-02 DIAGNOSIS — R3912 Poor urinary stream: Secondary | ICD-10-CM | POA: Diagnosis not present

## 2017-04-22 IMAGING — CR DG CHEST 2V
2 series · 2 of 2 positions shown · non-contrast
Comparison: 12/29/2013.

CLINICAL DATA: 74-year-old male preoperative study for left knee
surgery. Current history of hypertension and diabetes. Initial
encounter.

EXAM:
CHEST  2 VIEW

[w chest pa]
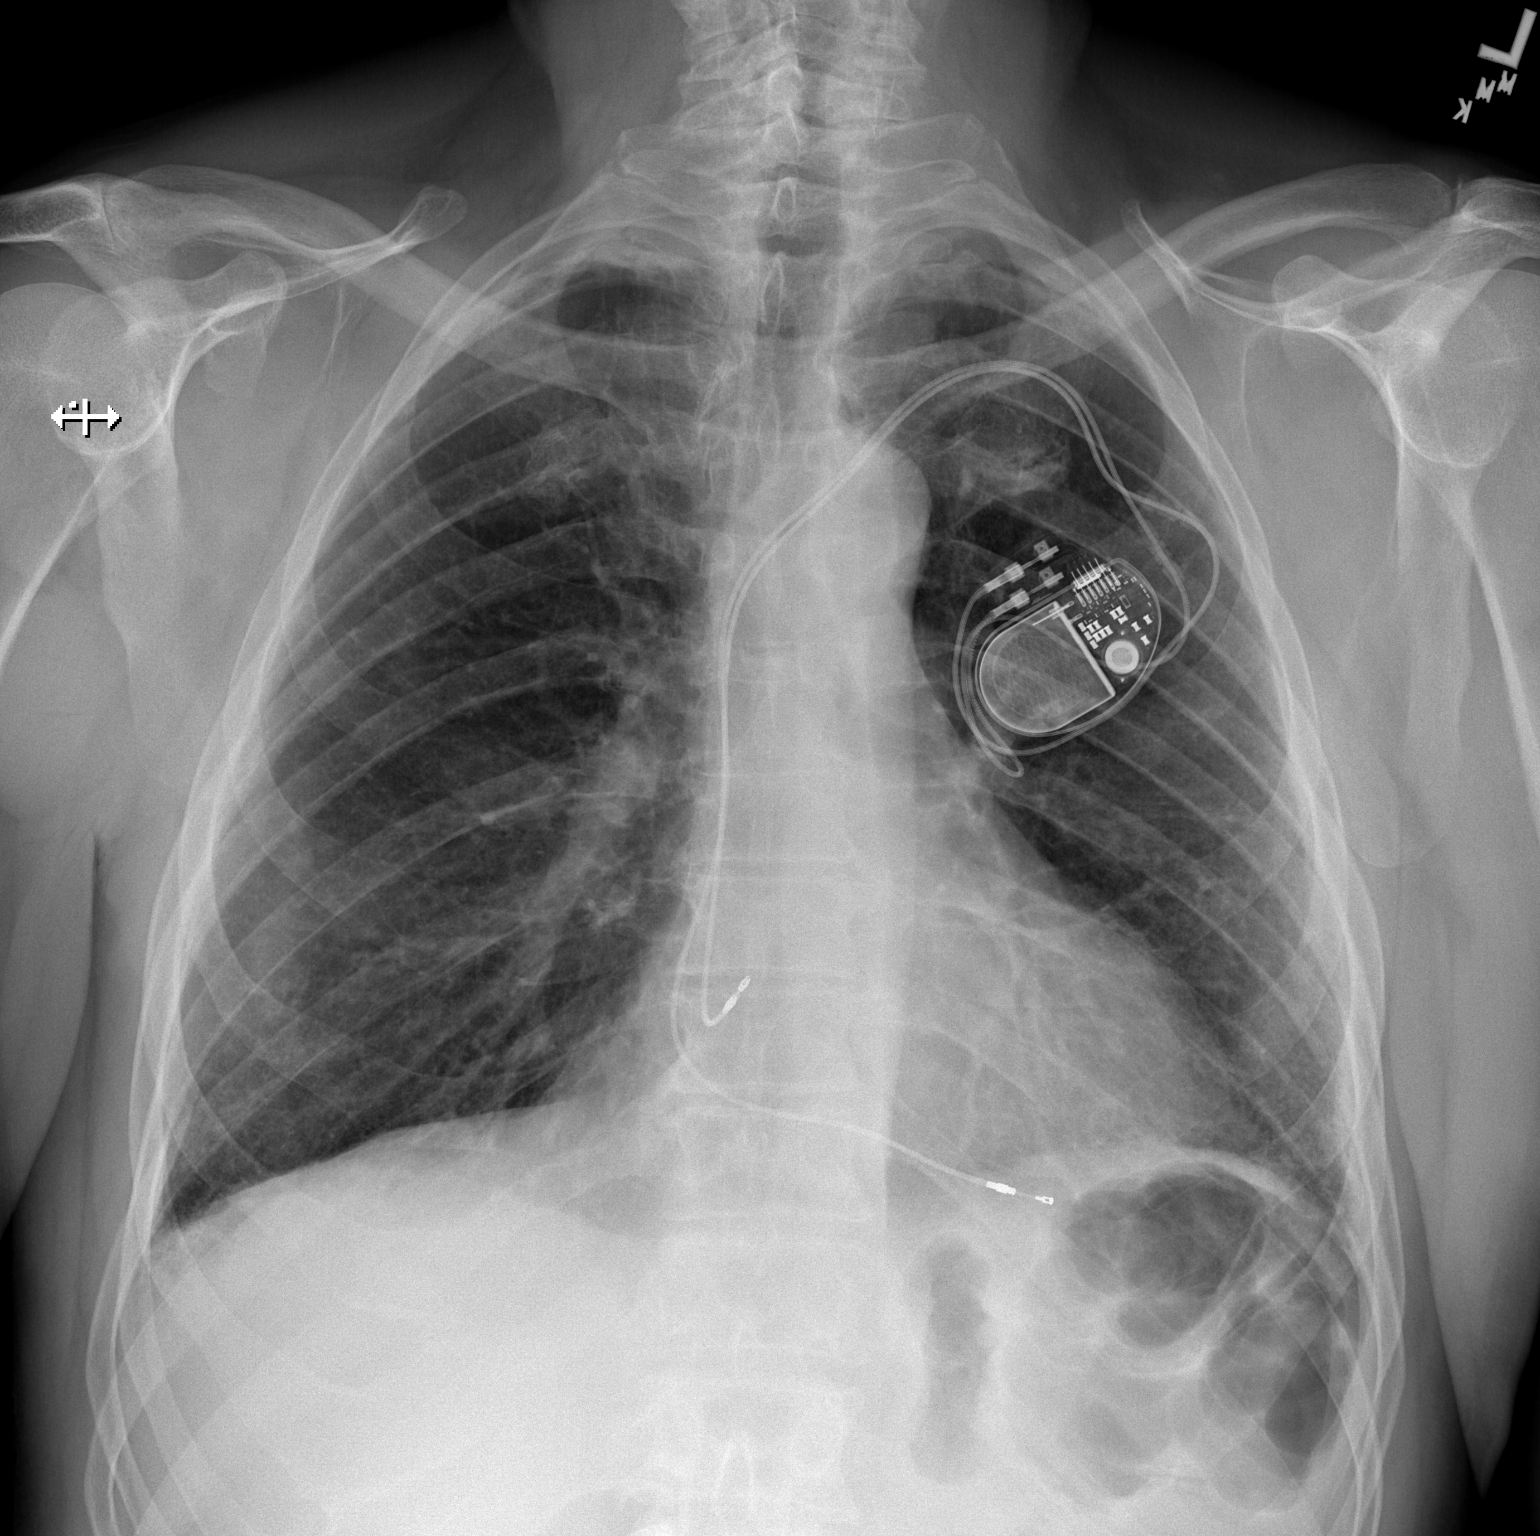

[w chest lat]
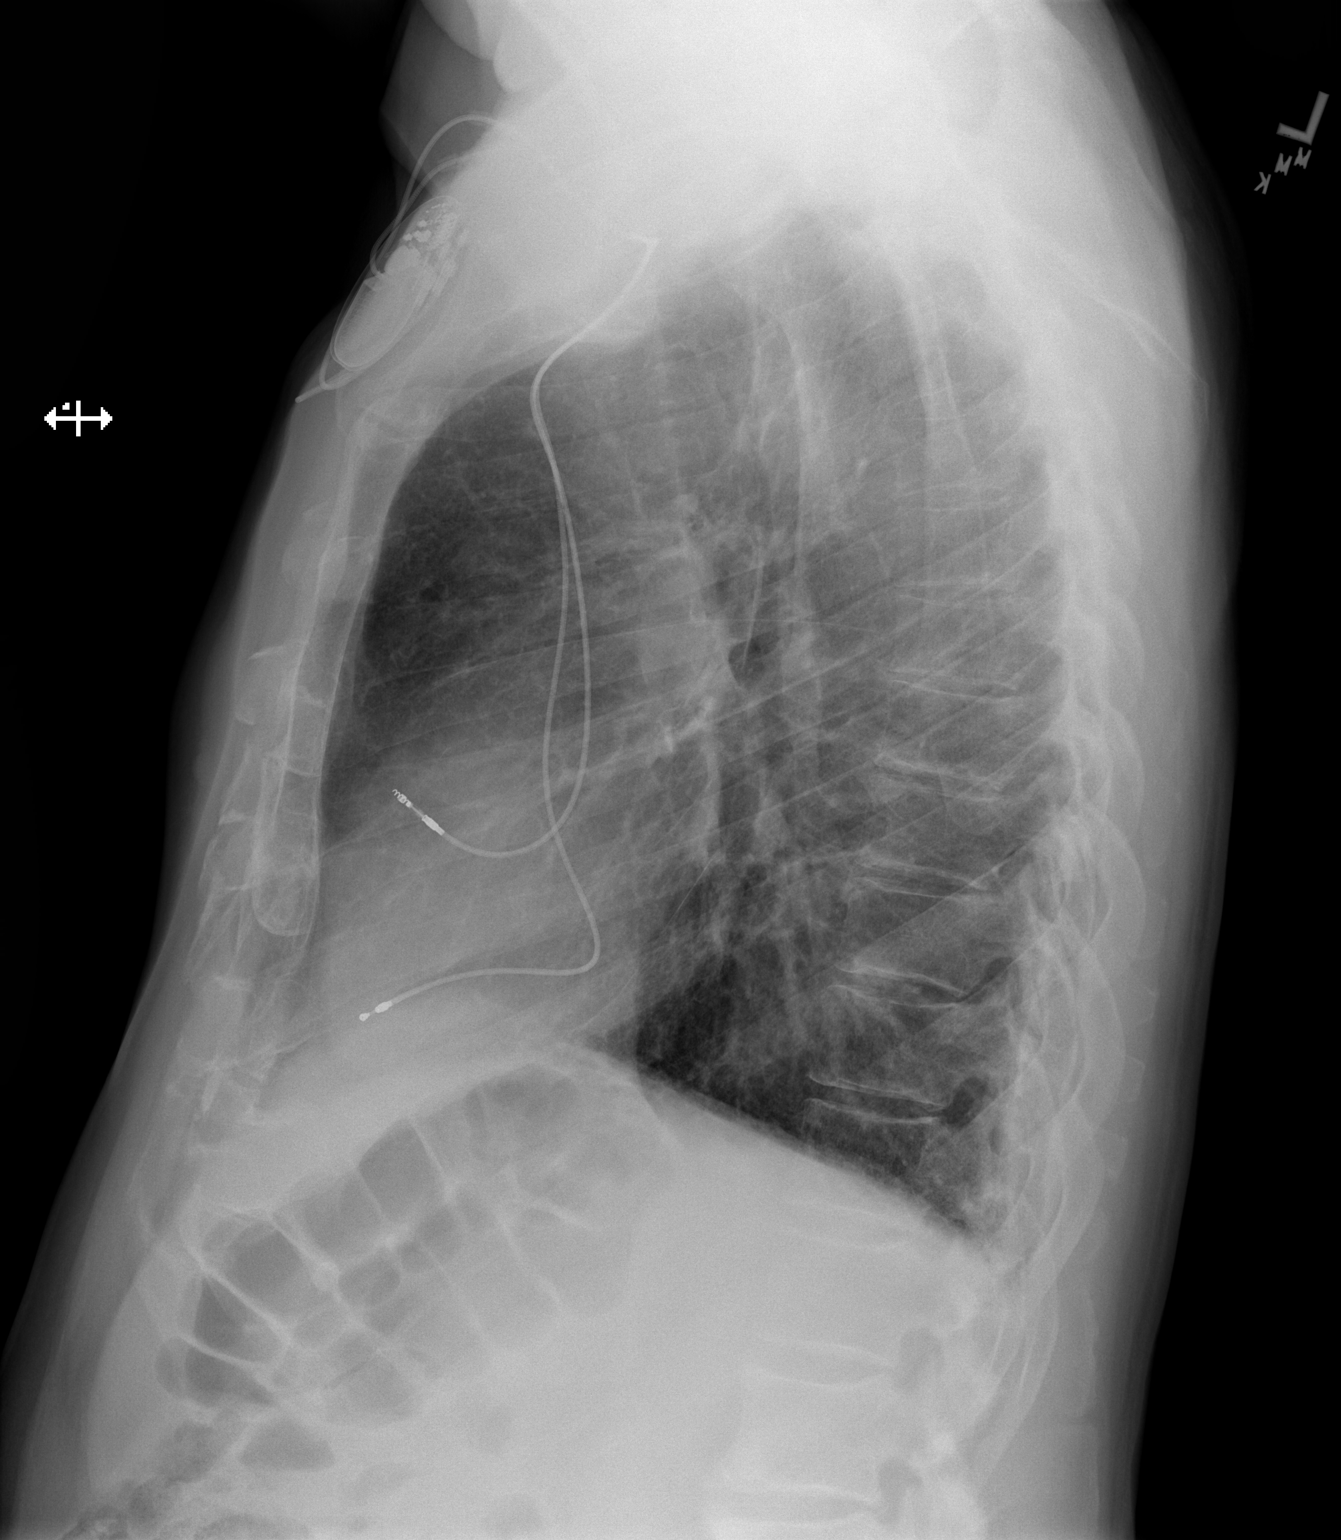

[2 of 2 positions shown; findings below may reference images not displayed]

FINDINGS: Left chest dual lead cardiac pacemaker appear stable. Normal cardiac
size and mediastinal contours. Visualized tracheal air column is
within normal limits. Upper limits of normal to mildly hyperinflated
lungs. No pneumothorax, pulmonary edema, or pleural effusion, but
there is suggestion of some chronic costophrenic angle pleural
thickening. There is also apical pleural/ parenchymal thickening
greater on the right. No confluent pulmonary opacity. No acute
osseous abnormality identified.
IMPRESSION: No acute cardiopulmonary abnormality.

## 2017-04-27 DIAGNOSIS — N401 Enlarged prostate with lower urinary tract symptoms: Secondary | ICD-10-CM | POA: Diagnosis not present

## 2017-04-27 DIAGNOSIS — N35013 Post-traumatic anterior urethral stricture: Secondary | ICD-10-CM | POA: Diagnosis not present

## 2017-05-04 ENCOUNTER — Ambulatory Visit (INDEPENDENT_AMBULATORY_CARE_PROVIDER_SITE_OTHER): Payer: Medicare Other | Admitting: *Deleted

## 2017-05-04 DIAGNOSIS — Z23 Encounter for immunization: Secondary | ICD-10-CM

## 2017-05-04 NOTE — Progress Notes (Signed)
Patient seen in office for Influenza Vaccination.   Tolerated IM administration well.   Immunization history updated.  

## 2017-06-01 ENCOUNTER — Encounter: Payer: Medicare Other | Admitting: Internal Medicine

## 2017-06-04 ENCOUNTER — Ambulatory Visit (INDEPENDENT_AMBULATORY_CARE_PROVIDER_SITE_OTHER): Payer: Medicare Other | Admitting: *Deleted

## 2017-06-04 DIAGNOSIS — I442 Atrioventricular block, complete: Secondary | ICD-10-CM

## 2017-06-05 NOTE — Progress Notes (Signed)
Remote pacemaker transmission.   

## 2017-06-07 ENCOUNTER — Encounter: Payer: Self-pay | Admitting: Cardiology

## 2017-06-15 DIAGNOSIS — N401 Enlarged prostate with lower urinary tract symptoms: Secondary | ICD-10-CM | POA: Diagnosis not present

## 2017-06-15 DIAGNOSIS — N320 Bladder-neck obstruction: Secondary | ICD-10-CM | POA: Diagnosis not present

## 2017-06-25 LAB — CUP PACEART REMOTE DEVICE CHECK
Battery Remaining Longevity: 126 mo
Battery Remaining Percentage: 95.5 %
Battery Voltage: 3.01 V
Brady Statistic AP VP Percent: 1 %
Brady Statistic AP VS Percent: 8.3 %
Brady Statistic AS VP Percent: 1 %
Brady Statistic AS VS Percent: 92 %
Brady Statistic RA Percent Paced: 8.2 %
Brady Statistic RV Percent Paced: 1 %
Date Time Interrogation Session: 20181008060017
Implantable Lead Implant Date: 20150501
Implantable Lead Implant Date: 20150501
Implantable Lead Location: 753859
Implantable Lead Location: 753860
Implantable Lead Model: 1948
Implantable Pulse Generator Implant Date: 20150501
Lead Channel Impedance Value: 480 Ohm
Lead Channel Impedance Value: 630 Ohm
Lead Channel Pacing Threshold Amplitude: 0.5 V
Lead Channel Pacing Threshold Amplitude: 0.75 V
Lead Channel Pacing Threshold Pulse Width: 0.5 ms
Lead Channel Pacing Threshold Pulse Width: 0.5 ms
Lead Channel Sensing Intrinsic Amplitude: 12 mV
Lead Channel Sensing Intrinsic Amplitude: 5 mV
Lead Channel Setting Pacing Amplitude: 2 V
Lead Channel Setting Pacing Amplitude: 2.5 V
Lead Channel Setting Pacing Pulse Width: 0.5 ms
Lead Channel Setting Sensing Sensitivity: 2 mV
Pulse Gen Model: 2240
Pulse Gen Serial Number: 7618665

## 2017-09-03 ENCOUNTER — Ambulatory Visit (INDEPENDENT_AMBULATORY_CARE_PROVIDER_SITE_OTHER): Payer: Medicare Other | Admitting: *Deleted

## 2017-09-03 DIAGNOSIS — I442 Atrioventricular block, complete: Secondary | ICD-10-CM

## 2017-09-03 NOTE — Progress Notes (Signed)
Remote pacemaker transmission.   

## 2017-09-04 ENCOUNTER — Encounter: Payer: Self-pay | Admitting: Cardiology

## 2017-09-08 LAB — CUP PACEART REMOTE DEVICE CHECK
Battery Remaining Longevity: 126 mo
Battery Remaining Percentage: 95.5 %
Battery Voltage: 3.01 V
Brady Statistic AP VP Percent: 1 %
Brady Statistic AP VS Percent: 6.7 %
Brady Statistic AS VP Percent: 1 %
Brady Statistic AS VS Percent: 93 %
Brady Statistic RA Percent Paced: 6.5 %
Brady Statistic RV Percent Paced: 1 %
Date Time Interrogation Session: 20190107074440
Implantable Lead Implant Date: 20150501
Implantable Lead Implant Date: 20150501
Implantable Lead Location: 753859
Implantable Lead Location: 753860
Implantable Lead Model: 1948
Implantable Pulse Generator Implant Date: 20150501
Lead Channel Impedance Value: 460 Ohm
Lead Channel Impedance Value: 630 Ohm
Lead Channel Pacing Threshold Amplitude: 0.5 V
Lead Channel Pacing Threshold Amplitude: 0.75 V
Lead Channel Pacing Threshold Pulse Width: 0.5 ms
Lead Channel Pacing Threshold Pulse Width: 0.5 ms
Lead Channel Sensing Intrinsic Amplitude: 12 mV
Lead Channel Sensing Intrinsic Amplitude: 5 mV
Lead Channel Setting Pacing Amplitude: 2 V
Lead Channel Setting Pacing Amplitude: 2.5 V
Lead Channel Setting Pacing Pulse Width: 0.5 ms
Lead Channel Setting Sensing Sensitivity: 2 mV
Pulse Gen Model: 2240
Pulse Gen Serial Number: 7618665

## 2017-12-03 ENCOUNTER — Ambulatory Visit (INDEPENDENT_AMBULATORY_CARE_PROVIDER_SITE_OTHER): Payer: Medicare Other | Admitting: *Deleted

## 2017-12-03 DIAGNOSIS — I442 Atrioventricular block, complete: Secondary | ICD-10-CM | POA: Diagnosis not present

## 2017-12-03 NOTE — Progress Notes (Signed)
Remote pacemaker transmission.   

## 2017-12-05 ENCOUNTER — Encounter: Payer: Self-pay | Admitting: Cardiology

## 2017-12-26 LAB — CUP PACEART REMOTE DEVICE CHECK
Battery Remaining Longevity: 126 mo
Battery Remaining Percentage: 95.5 %
Battery Voltage: 3.01 V
Brady Statistic AP VP Percent: 1 %
Brady Statistic AP VS Percent: 5.7 %
Brady Statistic AS VP Percent: 1 %
Brady Statistic AS VS Percent: 94 %
Brady Statistic RA Percent Paced: 5.5 %
Brady Statistic RV Percent Paced: 1 %
Date Time Interrogation Session: 20190408062656
Implantable Lead Implant Date: 20150501
Implantable Lead Implant Date: 20150501
Implantable Lead Location: 753859
Implantable Lead Location: 753860
Implantable Lead Model: 1948
Implantable Pulse Generator Implant Date: 20150501
Lead Channel Impedance Value: 460 Ohm
Lead Channel Impedance Value: 590 Ohm
Lead Channel Pacing Threshold Amplitude: 0.5 V
Lead Channel Pacing Threshold Amplitude: 0.75 V
Lead Channel Pacing Threshold Pulse Width: 0.5 ms
Lead Channel Pacing Threshold Pulse Width: 0.5 ms
Lead Channel Sensing Intrinsic Amplitude: 12 mV
Lead Channel Sensing Intrinsic Amplitude: 5 mV
Lead Channel Setting Pacing Amplitude: 2 V
Lead Channel Setting Pacing Amplitude: 2.5 V
Lead Channel Setting Pacing Pulse Width: 0.5 ms
Lead Channel Setting Sensing Sensitivity: 2 mV
Pulse Gen Model: 2240
Pulse Gen Serial Number: 7618665

## 2017-12-28 ENCOUNTER — Encounter: Payer: Self-pay | Admitting: Internal Medicine

## 2017-12-28 ENCOUNTER — Ambulatory Visit (INDEPENDENT_AMBULATORY_CARE_PROVIDER_SITE_OTHER): Payer: Medicare Other | Admitting: Internal Medicine

## 2017-12-28 VITALS — BP 108/60 | HR 60 | Ht 72.0 in | Wt 191.0 lb

## 2017-12-28 DIAGNOSIS — I441 Atrioventricular block, second degree: Secondary | ICD-10-CM | POA: Diagnosis not present

## 2017-12-28 DIAGNOSIS — I442 Atrioventricular block, complete: Secondary | ICD-10-CM

## 2017-12-28 DIAGNOSIS — I10 Essential (primary) hypertension: Secondary | ICD-10-CM

## 2017-12-28 LAB — CUP PACEART INCLINIC DEVICE CHECK
Battery Remaining Longevity: 141 mo
Battery Voltage: 2.99 V
Brady Statistic RA Percent Paced: 5.6 %
Brady Statistic RV Percent Paced: 0 %
Date Time Interrogation Session: 20190503140202
Implantable Lead Implant Date: 20150501
Implantable Lead Implant Date: 20150501
Implantable Lead Location: 753859
Implantable Lead Location: 753860
Implantable Lead Model: 1948
Implantable Pulse Generator Implant Date: 20150501
Lead Channel Impedance Value: 462.5 Ohm
Lead Channel Impedance Value: 612.5 Ohm
Lead Channel Pacing Threshold Amplitude: 0.75 V
Lead Channel Pacing Threshold Amplitude: 0.75 V
Lead Channel Pacing Threshold Amplitude: 0.75 V
Lead Channel Pacing Threshold Amplitude: 0.75 V
Lead Channel Pacing Threshold Pulse Width: 0.5 ms
Lead Channel Pacing Threshold Pulse Width: 0.5 ms
Lead Channel Pacing Threshold Pulse Width: 0.5 ms
Lead Channel Pacing Threshold Pulse Width: 0.5 ms
Lead Channel Sensing Intrinsic Amplitude: 12 mV
Lead Channel Sensing Intrinsic Amplitude: 5 mV
Lead Channel Setting Pacing Amplitude: 2 V
Lead Channel Setting Pacing Amplitude: 2.5 V
Lead Channel Setting Pacing Pulse Width: 0.5 ms
Lead Channel Setting Sensing Sensitivity: 2 mV
Pulse Gen Model: 2240
Pulse Gen Serial Number: 7618665
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
## 2017-12-28 NOTE — Patient Instructions (Addendum)
Medication Instructions:   Your physician recommends that you continue on your current medications as directed. Please refer to the Current Medication list given to you today.  Labwork:  NONE  Testing/Procedures:  NONE  Follow-Up: Your physician recommends that you schedule a follow-up appointment in: 1 year with Dr. Rayann Heman. Please schedule this appointment today before leaving the office.   Any Other Special Instructions Will Be Listed Below (If Applicable).   Your next device check from home is on 03/04/18.  If you need a refill on your cardiac medications before your next appointment, please call your pharmacy.

## 2018-01-02 ENCOUNTER — Ambulatory Visit (INDEPENDENT_AMBULATORY_CARE_PROVIDER_SITE_OTHER): Payer: 59 | Admitting: Orthopaedic Surgery

## 2018-01-04 ENCOUNTER — Ambulatory Visit (INDEPENDENT_AMBULATORY_CARE_PROVIDER_SITE_OTHER): Payer: Medicare Other

## 2018-01-04 ENCOUNTER — Ambulatory Visit (INDEPENDENT_AMBULATORY_CARE_PROVIDER_SITE_OTHER): Payer: Medicare Other | Admitting: Orthopaedic Surgery

## 2018-01-04 ENCOUNTER — Encounter (INDEPENDENT_AMBULATORY_CARE_PROVIDER_SITE_OTHER): Payer: Self-pay | Admitting: Orthopaedic Surgery

## 2018-01-04 VITALS — BP 143/67 | HR 60 | Resp 18 | Ht 72.0 in | Wt 191.0 lb

## 2018-01-04 DIAGNOSIS — M25512 Pain in left shoulder: Secondary | ICD-10-CM

## 2018-01-04 DIAGNOSIS — M25511 Pain in right shoulder: Secondary | ICD-10-CM

## 2018-01-04 DIAGNOSIS — N320 Bladder-neck obstruction: Secondary | ICD-10-CM | POA: Diagnosis not present

## 2018-01-04 DIAGNOSIS — N401 Enlarged prostate with lower urinary tract symptoms: Secondary | ICD-10-CM | POA: Diagnosis not present

## 2018-01-04 DIAGNOSIS — R3121 Asymptomatic microscopic hematuria: Secondary | ICD-10-CM | POA: Diagnosis not present

## 2018-01-04 MED ORDER — LIDOCAINE HCL 2 % IJ SOLN
2.0000 mL | INTRAMUSCULAR | Status: AC | PRN
  Administered 2018-01-04: 2 mL

## 2018-01-04 MED ORDER — BUPIVACAINE HCL 0.5 % IJ SOLN
2.0000 mL | INTRAMUSCULAR | Status: AC | PRN
  Administered 2018-01-04: 2 mL via INTRA_ARTICULAR

## 2018-01-04 MED ORDER — METHYLPREDNISOLONE ACETATE 40 MG/ML IJ SUSP
80.0000 mg | INTRAMUSCULAR | Status: AC | PRN
Start: 2018-01-04 — End: 2018-01-04
  Administered 2018-01-04: 80 mg
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
## 2018-02-08 DIAGNOSIS — Z950 Presence of cardiac pacemaker: Secondary | ICD-10-CM | POA: Diagnosis not present

## 2018-02-08 DIAGNOSIS — H5712 Ocular pain, left eye: Secondary | ICD-10-CM | POA: Diagnosis not present

## 2018-02-08 DIAGNOSIS — Z880 Allergy status to penicillin: Secondary | ICD-10-CM | POA: Diagnosis not present

## 2018-02-08 DIAGNOSIS — H53142 Visual discomfort, left eye: Secondary | ICD-10-CM | POA: Diagnosis not present

## 2018-02-08 DIAGNOSIS — S0502XA Injury of conjunctiva and corneal abrasion without foreign body, left eye, initial encounter: Secondary | ICD-10-CM | POA: Diagnosis not present

## 2018-02-08 DIAGNOSIS — Z888 Allergy status to other drugs, medicaments and biological substances status: Secondary | ICD-10-CM | POA: Diagnosis not present

## 2018-02-08 DIAGNOSIS — I2510 Atherosclerotic heart disease of native coronary artery without angina pectoris: Secondary | ICD-10-CM | POA: Diagnosis not present

## 2018-02-08 DIAGNOSIS — W458XXA Other foreign body or object entering through skin, initial encounter: Secondary | ICD-10-CM | POA: Diagnosis not present

## 2018-02-08 DIAGNOSIS — E119 Type 2 diabetes mellitus without complications: Secondary | ICD-10-CM | POA: Diagnosis not present

## 2018-02-09 DIAGNOSIS — T1512XA Foreign body in conjunctival sac, left eye, initial encounter: Secondary | ICD-10-CM | POA: Diagnosis not present

## 2018-03-04 ENCOUNTER — Ambulatory Visit (INDEPENDENT_AMBULATORY_CARE_PROVIDER_SITE_OTHER): Payer: Medicare Other | Admitting: *Deleted

## 2018-03-04 DIAGNOSIS — I442 Atrioventricular block, complete: Secondary | ICD-10-CM | POA: Diagnosis not present

## 2018-03-04 NOTE — Progress Notes (Signed)
Remote pacemaker transmission.   

## 2018-03-09 DIAGNOSIS — I2510 Atherosclerotic heart disease of native coronary artery without angina pectoris: Secondary | ICD-10-CM | POA: Diagnosis not present

## 2018-03-09 DIAGNOSIS — A09 Infectious gastroenteritis and colitis, unspecified: Secondary | ICD-10-CM | POA: Diagnosis not present

## 2018-03-09 DIAGNOSIS — N179 Acute kidney failure, unspecified: Secondary | ICD-10-CM | POA: Diagnosis not present

## 2018-03-09 DIAGNOSIS — N132 Hydronephrosis with renal and ureteral calculous obstruction: Secondary | ICD-10-CM | POA: Diagnosis not present

## 2018-03-09 DIAGNOSIS — N200 Calculus of kidney: Secondary | ICD-10-CM | POA: Diagnosis not present

## 2018-03-09 DIAGNOSIS — K6389 Other specified diseases of intestine: Secondary | ICD-10-CM | POA: Diagnosis not present

## 2018-03-09 DIAGNOSIS — R110 Nausea: Secondary | ICD-10-CM | POA: Diagnosis not present

## 2018-03-09 DIAGNOSIS — N1330 Unspecified hydronephrosis: Secondary | ICD-10-CM | POA: Diagnosis not present

## 2018-03-09 DIAGNOSIS — Z0289 Encounter for other administrative examinations: Secondary | ICD-10-CM | POA: Diagnosis not present

## 2018-03-09 DIAGNOSIS — K529 Noninfective gastroenteritis and colitis, unspecified: Secondary | ICD-10-CM | POA: Diagnosis not present

## 2018-03-09 DIAGNOSIS — N202 Calculus of kidney with calculus of ureter: Secondary | ICD-10-CM | POA: Diagnosis not present

## 2018-03-09 DIAGNOSIS — K820 Obstruction of gallbladder: Secondary | ICD-10-CM | POA: Diagnosis not present

## 2018-03-09 DIAGNOSIS — E119 Type 2 diabetes mellitus without complications: Secondary | ICD-10-CM | POA: Diagnosis not present

## 2018-03-09 DIAGNOSIS — N400 Enlarged prostate without lower urinary tract symptoms: Secondary | ICD-10-CM | POA: Diagnosis not present

## 2018-03-10 DIAGNOSIS — Z7984 Long term (current) use of oral hypoglycemic drugs: Secondary | ICD-10-CM | POA: Diagnosis not present

## 2018-03-10 DIAGNOSIS — N400 Enlarged prostate without lower urinary tract symptoms: Secondary | ICD-10-CM | POA: Diagnosis present

## 2018-03-10 DIAGNOSIS — Z950 Presence of cardiac pacemaker: Secondary | ICD-10-CM | POA: Diagnosis not present

## 2018-03-10 DIAGNOSIS — N132 Hydronephrosis with renal and ureteral calculous obstruction: Secondary | ICD-10-CM | POA: Diagnosis present

## 2018-03-10 DIAGNOSIS — E119 Type 2 diabetes mellitus without complications: Secondary | ICD-10-CM | POA: Diagnosis present

## 2018-03-10 DIAGNOSIS — K529 Noninfective gastroenteritis and colitis, unspecified: Secondary | ICD-10-CM | POA: Diagnosis present

## 2018-03-10 DIAGNOSIS — N179 Acute kidney failure, unspecified: Secondary | ICD-10-CM | POA: Diagnosis not present

## 2018-03-10 DIAGNOSIS — I2510 Atherosclerotic heart disease of native coronary artery without angina pectoris: Secondary | ICD-10-CM | POA: Diagnosis present

## 2018-03-10 DIAGNOSIS — N202 Calculus of kidney with calculus of ureter: Secondary | ICD-10-CM | POA: Diagnosis not present

## 2018-03-10 LAB — CUP PACEART REMOTE DEVICE CHECK
Battery Remaining Longevity: 126 mo
Battery Remaining Percentage: 95.5 %
Battery Voltage: 3.01 V
Brady Statistic AP VP Percent: 1 %
Brady Statistic AP VS Percent: 9.6 %
Brady Statistic AS VP Percent: 1 %
Brady Statistic AS VS Percent: 90 %
Brady Statistic RA Percent Paced: 9.4 %
Brady Statistic RV Percent Paced: 1 %
Date Time Interrogation Session: 20190708060014
Implantable Lead Implant Date: 20150501
Implantable Lead Implant Date: 20150501
Implantable Lead Location: 753859
Implantable Lead Location: 753860
Implantable Lead Model: 1948
Implantable Pulse Generator Implant Date: 20150501
Lead Channel Impedance Value: 480 Ohm
Lead Channel Impedance Value: 600 Ohm
Lead Channel Pacing Threshold Amplitude: 0.75 V
Lead Channel Pacing Threshold Amplitude: 0.75 V
Lead Channel Pacing Threshold Pulse Width: 0.5 ms
Lead Channel Pacing Threshold Pulse Width: 0.5 ms
Lead Channel Sensing Intrinsic Amplitude: 11.3 mV
Lead Channel Sensing Intrinsic Amplitude: 5 mV
Lead Channel Setting Pacing Amplitude: 2 V
Lead Channel Setting Pacing Amplitude: 2.5 V
Lead Channel Setting Pacing Pulse Width: 0.5 ms
Lead Channel Setting Sensing Sensitivity: 2 mV
Pulse Gen Model: 2240
Pulse Gen Serial Number: 7618665

## 2018-03-18 ENCOUNTER — Ambulatory Visit (INDEPENDENT_AMBULATORY_CARE_PROVIDER_SITE_OTHER): Payer: Medicare Other | Admitting: Orthopaedic Surgery

## 2018-03-18 ENCOUNTER — Other Ambulatory Visit (INDEPENDENT_AMBULATORY_CARE_PROVIDER_SITE_OTHER): Payer: Self-pay | Admitting: *Deleted

## 2018-03-18 ENCOUNTER — Ambulatory Visit (INDEPENDENT_AMBULATORY_CARE_PROVIDER_SITE_OTHER): Payer: Medicare Other

## 2018-03-18 ENCOUNTER — Encounter: Payer: Self-pay | Admitting: Family Medicine

## 2018-03-18 ENCOUNTER — Ambulatory Visit (INDEPENDENT_AMBULATORY_CARE_PROVIDER_SITE_OTHER): Payer: Medicare Other | Admitting: Family Medicine

## 2018-03-18 ENCOUNTER — Encounter (INDEPENDENT_AMBULATORY_CARE_PROVIDER_SITE_OTHER): Payer: Self-pay | Admitting: Orthopaedic Surgery

## 2018-03-18 VITALS — BP 118/65 | HR 62 | Ht 72.0 in | Wt 191.0 lb

## 2018-03-18 VITALS — BP 128/58 | HR 74 | Temp 97.8°F | Resp 14 | Ht 72.0 in | Wt 192.0 lb

## 2018-03-18 DIAGNOSIS — N200 Calculus of kidney: Secondary | ICD-10-CM

## 2018-03-18 DIAGNOSIS — Z09 Encounter for follow-up examination after completed treatment for conditions other than malignant neoplasm: Secondary | ICD-10-CM

## 2018-03-18 DIAGNOSIS — R7309 Other abnormal glucose: Secondary | ICD-10-CM | POA: Diagnosis not present

## 2018-03-18 DIAGNOSIS — M25551 Pain in right hip: Secondary | ICD-10-CM | POA: Diagnosis not present
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
## 2018-03-19 LAB — UNLABELED: Test Ordered On Req: 7909

## 2018-03-20 LAB — CBC WITH DIFFERENTIAL/PLATELET
Basophils Absolute: 50 cells/uL (ref 0–200)
Basophils Relative: 0.8 %
Eosinophils Absolute: 180 cells/uL (ref 15–500)
Eosinophils Relative: 2.9 %
HCT: 38.1 % — ABNORMAL LOW (ref 38.5–50.0)
Hemoglobin: 12.6 g/dL — ABNORMAL LOW (ref 13.2–17.1)
Lymphs Abs: 2139 cells/uL (ref 850–3900)
MCH: 29.1 pg (ref 27.0–33.0)
MCHC: 33.1 g/dL (ref 32.0–36.0)
MCV: 88 fL (ref 80.0–100.0)
MPV: 12.1 fL (ref 7.5–12.5)
Monocytes Relative: 6.6 %
Neutro Abs: 3422 cells/uL (ref 1500–7800)
Neutrophils Relative %: 55.2 %
Platelets: 229 10*3/uL (ref 140–400)
RBC: 4.33 10*6/uL (ref 4.20–5.80)
RDW: 12.2 % (ref 11.0–15.0)
Total Lymphocyte: 34.5 %
WBC mixed population: 409 cells/uL (ref 200–950)
WBC: 6.2 10*3/uL (ref 3.8–10.8)

## 2018-03-20 LAB — TEST AUTHORIZATION

## 2018-03-20 LAB — COMPLETE METABOLIC PANEL WITH GFR
AG Ratio: 1.7 (calc) (ref 1.0–2.5)
ALT: 17 U/L (ref 9–46)
AST: 17 U/L (ref 10–35)
Albumin: 4.4 g/dL (ref 3.6–5.1)
Alkaline phosphatase (APISO): 35 U/L — ABNORMAL LOW (ref 40–115)
BUN: 25 mg/dL (ref 7–25)
CO2: 28 mmol/L (ref 20–32)
Calcium: 9.4 mg/dL (ref 8.6–10.3)
Chloride: 104 mmol/L (ref 98–110)
Creat: 1.11 mg/dL (ref 0.70–1.18)
GFR, Est African American: 73 mL/min/{1.73_m2} (ref >=60)
GFR, Est Non African American: 63 mL/min/{1.73_m2} (ref >=60)
Globulin: 2.6 g/dL (calc) (ref 1.9–3.7)
Glucose, Bld: 145 mg/dL — ABNORMAL HIGH (ref 65–99)
Potassium: 4.2 mmol/L (ref 3.5–5.3)
Sodium: 140 mmol/L (ref 135–146)
Total Bilirubin: 0.5 mg/dL (ref 0.2–1.2)
Total Protein: 7 g/dL (ref 6.1–8.1)

## 2018-03-20 LAB — HEMOGLOBIN A1C W/OUT EAG: Hgb A1c MFr Bld: 6.9 % of total Hgb — ABNORMAL HIGH (ref <5.7)

## 2018-03-28 ENCOUNTER — Other Ambulatory Visit: Payer: Self-pay

## 2018-04-01 ENCOUNTER — Ambulatory Visit
Admission: RE | Admit: 2018-04-01 | Discharge: 2018-04-01 | Disposition: A | Discharge status: Home / Self Care | Payer: Medicare Other | Source: Ambulatory Visit | Attending: Orthopaedic Surgery | Admitting: Orthopaedic Surgery

## 2018-04-01 DIAGNOSIS — M1611 Unilateral primary osteoarthritis, right hip: Secondary | ICD-10-CM | POA: Diagnosis not present

## 2018-04-01 DIAGNOSIS — M25551 Pain in right hip: Secondary | ICD-10-CM

## 2018-04-01 MED ORDER — METHYLPREDNISOLONE ACETATE 40 MG/ML INJ SUSP (RADIOLOG
120.0000 mg | Freq: Once | INTRAMUSCULAR | Status: AC
  Administered 2018-04-01: 120 mg via INTRA_ARTICULAR

## 2018-04-01 MED ORDER — IOPAMIDOL (ISOVUE-M 200) INJECTION 41%
1.0000 mL | Freq: Once | INTRAMUSCULAR | Status: AC
  Administered 2018-04-01: 1 mL via INTRA_ARTICULAR

## 2018-04-19 ENCOUNTER — Ambulatory Visit (INDEPENDENT_AMBULATORY_CARE_PROVIDER_SITE_OTHER): Payer: 59 | Admitting: Orthopaedic Surgery

## 2018-05-03 ENCOUNTER — Encounter (INDEPENDENT_AMBULATORY_CARE_PROVIDER_SITE_OTHER): Payer: Self-pay | Admitting: Orthopaedic Surgery

## 2018-05-03 ENCOUNTER — Ambulatory Visit (INDEPENDENT_AMBULATORY_CARE_PROVIDER_SITE_OTHER): Payer: Medicare Other | Admitting: Orthopaedic Surgery

## 2018-05-03 ENCOUNTER — Ambulatory Visit (INDEPENDENT_AMBULATORY_CARE_PROVIDER_SITE_OTHER): Payer: 59 | Admitting: Orthopaedic Surgery

## 2018-05-03 VITALS — BP 120/65 | HR 66 | Ht 72.0 in | Wt 187.0 lb

## 2018-05-03 DIAGNOSIS — M1611 Unilateral primary osteoarthritis, right hip: Secondary | ICD-10-CM | POA: Diagnosis not present
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
## 2018-06-03 ENCOUNTER — Ambulatory Visit (INDEPENDENT_AMBULATORY_CARE_PROVIDER_SITE_OTHER): Payer: Medicare Other | Admitting: *Deleted

## 2018-06-03 DIAGNOSIS — I442 Atrioventricular block, complete: Secondary | ICD-10-CM

## 2018-06-03 NOTE — Progress Notes (Signed)
Remote pacemaker transmission.   

## 2018-06-21 ENCOUNTER — Ambulatory Visit
Admission: RE | Admit: 2018-06-21 | Discharge: 2018-06-21 | Disposition: A | Discharge status: Home / Self Care | Payer: Medicare Other | Source: Ambulatory Visit | Attending: Family Medicine | Admitting: Family Medicine

## 2018-06-21 ENCOUNTER — Encounter: Payer: Self-pay | Admitting: Family Medicine

## 2018-06-21 ENCOUNTER — Ambulatory Visit (INDEPENDENT_AMBULATORY_CARE_PROVIDER_SITE_OTHER): Payer: Medicare Other | Admitting: Family Medicine

## 2018-06-21 VITALS — BP 116/58 | HR 74 | Temp 97.9°F | Resp 12 | Ht 72.0 in | Wt 191.0 lb

## 2018-06-21 DIAGNOSIS — Z23 Encounter for immunization: Secondary | ICD-10-CM | POA: Diagnosis not present

## 2018-06-21 DIAGNOSIS — E118 Type 2 diabetes mellitus with unspecified complications: Secondary | ICD-10-CM | POA: Diagnosis not present

## 2018-06-21 DIAGNOSIS — I10 Essential (primary) hypertension: Secondary | ICD-10-CM | POA: Diagnosis not present

## 2018-06-21 DIAGNOSIS — R0989 Other specified symptoms and signs involving the circulatory and respiratory systems: Secondary | ICD-10-CM
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
## 2018-06-21 NOTE — Addendum Note (Signed)
Addended by: Shary Decamp B on: 06/21/2018 12:05 PM   Modules accepted: Orders

## 2018-06-22 LAB — CBC WITH DIFFERENTIAL/PLATELET
Basophils Absolute: 50 cells/uL (ref 0–200)
Basophils Relative: 0.9 %
Eosinophils Absolute: 248 cells/uL (ref 15–500)
Eosinophils Relative: 4.5 %
HCT: 37.3 % — ABNORMAL LOW (ref 38.5–50.0)
Hemoglobin: 12.3 g/dL — ABNORMAL LOW (ref 13.2–17.1)
Lymphs Abs: 1606 cells/uL (ref 850–3900)
MCH: 28.7 pg (ref 27.0–33.0)
MCHC: 33 g/dL (ref 32.0–36.0)
MCV: 87.1 fL (ref 80.0–100.0)
MPV: 11.7 fL (ref 7.5–12.5)
Monocytes Relative: 6.9 %
Neutro Abs: 3218 cells/uL (ref 1500–7800)
Neutrophils Relative %: 58.5 %
Platelets: 223 10*3/uL (ref 140–400)
RBC: 4.28 10*6/uL (ref 4.20–5.80)
RDW: 12.3 % (ref 11.0–15.0)
Total Lymphocyte: 29.2 %
WBC mixed population: 380 cells/uL (ref 200–950)
WBC: 5.5 10*3/uL (ref 3.8–10.8)

## 2018-06-22 LAB — COMPLETE METABOLIC PANEL WITH GFR
AG Ratio: 1.8 (calc) (ref 1.0–2.5)
ALT: 14 U/L (ref 9–46)
AST: 14 U/L (ref 10–35)
Albumin: 4.2 g/dL (ref 3.6–5.1)
Alkaline phosphatase (APISO): 43 U/L (ref 40–115)
BUN/Creatinine Ratio: 19 (calc) (ref 6–22)
BUN: 25 mg/dL (ref 7–25)
CO2: 29 mmol/L (ref 20–32)
Calcium: 9.7 mg/dL (ref 8.6–10.3)
Chloride: 105 mmol/L (ref 98–110)
Creat: 1.3 mg/dL — ABNORMAL HIGH (ref 0.70–1.18)
GFR, Est African American: 60 mL/min/{1.73_m2} (ref >=60)
GFR, Est Non African American: 52 mL/min/{1.73_m2} — ABNORMAL LOW (ref >=60)
Globulin: 2.4 g/dL (calc) (ref 1.9–3.7)
Glucose, Bld: 115 mg/dL — ABNORMAL HIGH (ref 65–99)
Potassium: 4.6 mmol/L (ref 3.5–5.3)
Sodium: 140 mmol/L (ref 135–146)
Total Bilirubin: 0.3 mg/dL (ref 0.2–1.2)
Total Protein: 6.6 g/dL (ref 6.1–8.1)

## 2018-06-22 LAB — LIPID PANEL
Cholesterol: 132 mg/dL (ref <200)
HDL: 43 mg/dL (ref >40)
LDL Cholesterol (Calc): 75 mg/dL (calc)
Non-HDL Cholesterol (Calc): 89 mg/dL (calc) (ref <130)
Total CHOL/HDL Ratio: 3.1 (calc) (ref <5.0)
Triglycerides: 64 mg/dL (ref <150)

## 2018-06-22 LAB — MICROALBUMIN, URINE: Microalb, Ur: 3.2 mg/dL

## 2018-06-22 LAB — HEMOGLOBIN A1C
Hgb A1c MFr Bld: 7.1 % of total Hgb — ABNORMAL HIGH (ref <5.7)
Mean Plasma Glucose: 157 (calc)
eAG (mmol/L): 8.7 (calc)

## 2018-06-24 ENCOUNTER — Encounter: Payer: Self-pay | Admitting: *Deleted

## 2018-07-11 DIAGNOSIS — J069 Acute upper respiratory infection, unspecified: Secondary | ICD-10-CM | POA: Diagnosis not present

## 2018-07-19 LAB — CUP PACEART REMOTE DEVICE CHECK
Battery Remaining Longevity: 125 mo
Battery Remaining Percentage: 95.5 %
Battery Voltage: 3.01 V
Brady Statistic AP VP Percent: 1 %
Brady Statistic AP VS Percent: 11 %
Brady Statistic AS VP Percent: 1 %
Brady Statistic AS VS Percent: 89 %
Brady Statistic RA Percent Paced: 11 %
Brady Statistic RV Percent Paced: 1 %
Date Time Interrogation Session: 20191007074719
Implantable Lead Implant Date: 20150501
Implantable Lead Implant Date: 20150501
Implantable Lead Location: 753859
Implantable Lead Location: 753860
Implantable Lead Model: 1948
Implantable Pulse Generator Implant Date: 20150501
Lead Channel Impedance Value: 450 Ohm
Lead Channel Impedance Value: 580 Ohm
Lead Channel Pacing Threshold Amplitude: 0.75 V
Lead Channel Pacing Threshold Amplitude: 0.75 V
Lead Channel Pacing Threshold Pulse Width: 0.5 ms
Lead Channel Pacing Threshold Pulse Width: 0.5 ms
Lead Channel Sensing Intrinsic Amplitude: 10.4 mV
Lead Channel Sensing Intrinsic Amplitude: 5 mV
Lead Channel Setting Pacing Amplitude: 2 V
Lead Channel Setting Pacing Amplitude: 2.5 V
Lead Channel Setting Pacing Pulse Width: 0.5 ms
Lead Channel Setting Sensing Sensitivity: 2 mV
Pulse Gen Model: 2240
Pulse Gen Serial Number: 7618665

## 2018-07-22 DIAGNOSIS — D649 Anemia, unspecified: Secondary | ICD-10-CM | POA: Diagnosis not present

## 2018-07-22 DIAGNOSIS — J180 Bronchopneumonia, unspecified organism: Secondary | ICD-10-CM | POA: Diagnosis not present

## 2018-07-29 DIAGNOSIS — J069 Acute upper respiratory infection, unspecified: Secondary | ICD-10-CM | POA: Diagnosis not present

## 2018-07-29 DIAGNOSIS — J180 Bronchopneumonia, unspecified organism: Secondary | ICD-10-CM | POA: Diagnosis not present

## 2018-07-30 DIAGNOSIS — J180 Bronchopneumonia, unspecified organism: Secondary | ICD-10-CM | POA: Diagnosis not present

## 2018-07-30 DIAGNOSIS — R05 Cough: Secondary | ICD-10-CM | POA: Diagnosis not present

## 2018-07-30 DIAGNOSIS — J019 Acute sinusitis, unspecified: Secondary | ICD-10-CM | POA: Diagnosis not present

## 2018-07-31 DIAGNOSIS — R05 Cough: Secondary | ICD-10-CM | POA: Diagnosis not present

## 2018-07-31 DIAGNOSIS — J019 Acute sinusitis, unspecified: Secondary | ICD-10-CM | POA: Diagnosis not present

## 2018-07-31 DIAGNOSIS — J180 Bronchopneumonia, unspecified organism: Secondary | ICD-10-CM | POA: Diagnosis not present

## 2018-08-06 ENCOUNTER — Encounter: Payer: Self-pay | Admitting: Family Medicine

## 2018-08-06 ENCOUNTER — Ambulatory Visit (INDEPENDENT_AMBULATORY_CARE_PROVIDER_SITE_OTHER): Payer: Medicare Other | Admitting: Family Medicine

## 2018-08-06 VITALS — BP 118/60 | HR 76 | Temp 97.9°F | Resp 15 | Ht 72.0 in | Wt 192.4 lb

## 2018-08-06 DIAGNOSIS — J329 Chronic sinusitis, unspecified: Secondary | ICD-10-CM | POA: Diagnosis not present

## 2018-08-06 DIAGNOSIS — R0989 Other specified symptoms and signs involving the circulatory and respiratory systems: Secondary | ICD-10-CM | POA: Diagnosis not present

## 2018-08-06 DIAGNOSIS — J189 Pneumonia, unspecified organism: Secondary | ICD-10-CM | POA: Diagnosis not present

## 2018-08-06 MED ORDER — MOMETASONE FUROATE 50 MCG/ACT NA SUSP: 2.0000 | Freq: Every day | NASAL | 1 refills | Status: DC

## 2018-08-06 MED ORDER — CETIRIZINE HCL 10 MG PO TBDP: 10.0000 mg | ORAL_TABLET | Freq: Every day | ORAL | 0 refills | Status: DC

## 2018-08-06 NOTE — Progress Notes (Signed)
Patient ID: Edgar Evans, male    DOB: April 27, 1941, 77 y.o.   MRN: [id number]  PCP: Susy Frizzle, MD  Chief Complaint  Patient presents with  . Sinusitis    Patient in today with c/o sinus pressure, productive cough, and blowing mucus from nose with blood.     Subjective:   Edgar Evans is a 77 y.o. male, presents to clinic with CC of URI sx, sinus pain/pressure, productive cough for several weeks.     He went to UC multiple times for evaluation of same sx.  [facility] care [facility]/14, 11/25, 12/2, 12/3, 12/4.  Visit encounters and documentation reviewed by me and summarized below:  His first visit he was diagnosed with a upper respiratory infection and treated with cefdinir and Mucinex - his sx were dry cough and ST for 2 days Return visit on 11/25 he was diagnosed with anemia and bronchopneumonia treated with doxycycline, albuterol inhaler, prednisone and Rocephin injection in clinic -sore throat had improved but cough and sinus pain were no better.  Unable to see lab results I do not see any anemia and he had no leukocytosis. He returned on 12/2 again diagnosed with upper respiratory infection had rapid strep done which was negative, continued treatment for bronchopneumonia with Rocephin injection in clinic and then patient returned the next 2 days for Rocephin injections May need to request records from urgent care visits a cannot see any respiratory rate, heart rate or pulse ox, cannot see any radiology report or wet read for CXR.  He was advised to follow-up with a repeated chest x-ray in 2 weeks at the urgent care.  Patient presents today for some continued scratchy throat and cough only when trying to clear postnasal drip, but his main complaint is he continues to have runny nose and nasal discharge has become intermittently blood-tinged when he blows his nose.  Through all of the medications and steroids and antibiotics his  nasal symptoms have not improved.  He is not currently on any allergy medications, denies a history of seasonal allergies.  Patient has not been using the inhaler he was prescribed a few weeks ago, he denies any sinus pain pressure, fever, headache, sweats, weight changes, shortness of breath, chest pain, wheeze.  He denies any history of bronchitis or asthma.  He does report that Dr. Dennard Schaumann said his lungs sounded like Velcro when he was last evaluated -x-ray and office visit was reviewed by me, visit was on 06/21/2018 chest x-ray was negative last echocardiogram was 2015.  He has a pacemaker and is monitored monthly.  The urgent care noted anemia and in reviewing several years worth of labs he has a fairly stable mild anemia with hemoglobin ranging 12-13.  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  No recent worsening.    He denies any weight loss orthopnea, palpitations, lower extremity edema, chest pain, chest pressure.  Patient Active Problem List   Diagnosis Date Noted  . Unilateral primary osteoarthritis, right hip 05/03/2018  . Enlarged prostate with urinary obstruction 02/19/2017  . Essential hypertension 06/04/2015  . DJD (degenerative joint disease) of knee 02/15/2015  . 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 Syncope 12/28/2013  .  Complete heart block ([place]) 12/28/2013     Prior to Admission medications   Medication Sig Start Date End Date Taking? Authorizing Provider  aspirin EC 81 MG tablet Take 81 mg by mouth daily.   Yes [provider]  ibuprofen (ADVIL,MOTRIN) 200 MG tablet Take 400 mg by mouth every 8 (eight) hours as needed (for pain/headaches.).   Yes [provider]  lisinopril (PRINIVIL,ZESTRIL) 20 MG tablet Take 20 mg by mouth at bedtime.     Yes [provider]  metFORMIN (GLUCOPHAGE) 500 MG tablet Take 500 mg by mouth 2 (two) times daily with a meal.   Yes [provider]  Multiple Vitamin (MULTIVITAMIN WITH MINERALS) TABS tablet Take 1 tablet by mouth at bedtime. Centrum Silver   Yes [provider]  simvastatin (ZOCOR) 40 MG tablet Take 40 mg by mouth at bedtime.   Yes [provider]  vitamin B-12 (CYANOCOBALAMIN) 500 MCG tablet Take 500 mcg by mouth daily.   Yes [provider]  vitamin E 1000 UNIT capsule Take 500 Units by mouth daily.   Yes [provider]     Allergies  Allergen Reactions  . Penicillins Swelling    Tongue Has patient had a PCN reaction causing immediate rash, facial/tongue/throat swelling, SOB or lightheadedness with hypotension:Yes Has patient had a PCN reaction causing severe rash involving mucus membranes or skin necrosis:Unknown Has patient had a PCN reaction that required hospitalization:No Has patient had a PCN reaction occurring within the last 10 years:No If all of the above answers are "NO", then may proceed with Cephalosporin use.    . Tetanus Toxoids Swelling    Tongue     Family History  Problem Relation Age of Onset  . CAD Mother 55     Social History   Socioeconomic History  . Marital status: Married    Spouse name: Not on file  . Number of children: 1  . Years of education: Not on file  . Highest education level: Not on file  Occupational History  . Not on file  Social Needs  . Financial resource strain: Not on file  . Food insecurity:    Worry: Not on file    Inability: Not on file  . Transportation needs:    Medical: Not on file    Non-medical: Not on file  Tobacco Use  . Smoking status: Former Smoker    Packs/day: 1.00    Years: 25.00    Pack years: 25.00    Types: Cigarettes    Start date: 11/14/1958    Last attempt to quit: 11/14/1983    Years since quitting: 34.7  . Smokeless tobacco: Current User     Types: Chew  . Tobacco comment: Smoked years ago in the [employer]  Substance and Sexual Activity  . Alcohol use: No    Alcohol/week: 0.0 standard drinks  . Drug use: No  . Sexual activity: Not on file  Lifestyle  . Physical activity:    Days per week: Not on file    Minutes per session: Not on file  . Stress: Not on file  Relationships  . Social connections:    Talks on phone: Not on file    Gets together: Not on file    Attends religious service: Not on file    Active member of club or organization: Not on file    Attends meetings of clubs or organizations: Not on file    Relationship status: Not on file  . Intimate  partner violence:    Fear of current or ex partner: Not on file    Emotionally abused: Not on file    Physically abused: Not on file    Forced sexual activity: Not on file  Other Topics Concern  . Not on file  Social History Narrative   Was in the [employer].  Lives with wife.       Review of Systems  Constitutional: Negative.   HENT: Negative.   Eyes: Negative.   Respiratory: Negative.   Cardiovascular: Negative.   Gastrointestinal: Negative.   Endocrine: Negative.   Genitourinary: Negative.   Musculoskeletal: Negative.   Skin: Negative.   Allergic/Immunologic: Negative.   Neurological: Negative.   Hematological: Negative.   Psychiatric/Behavioral: Negative.   All other systems reviewed and are negative.      Objective:    Vitals:   08/06/18 1055  BP: 118/60  Pulse: 76  Resp: 15  Temp: 97.9 F (36.6 C)  TempSrc: Oral  SpO2: 98%  Weight: 192 lb 6 oz (87.3 kg)  Height: 6' (1.829 m)      Physical Exam  Constitutional: He is oriented to person, place, and time. He appears well-developed and well-nourished. He is cooperative.  Non-toxic appearance. He does not have a sickly appearance. No distress.  HENT:  Head: Normocephalic and atraumatic.  Right Ear: Tympanic membrane, external ear and ear canal normal.  Left Ear: Tympanic membrane, external  ear and ear canal normal.  Nose: Mucosal edema and rhinorrhea present. Right sinus exhibits no maxillary sinus tenderness and no frontal sinus tenderness. Left sinus exhibits no maxillary sinus tenderness and no frontal sinus tenderness.  Mouth/Throat: Uvula is midline and mucous membranes are normal. Mucous membranes are not pale, not dry and not cyanotic. No trismus in the jaw. No uvula swelling. Posterior oropharyngeal erythema present. No oropharyngeal exudate, posterior oropharyngeal edema or tonsillar abscesses. No tonsillar exudate.  Nasal edema and erythema OP erythema  Eyes: Pupils are equal, round, and reactive to light. Conjunctivae, EOM and lids are normal. Right eye exhibits no discharge. Left eye exhibits no discharge.  Neck: Trachea normal, normal range of motion and phonation normal. Neck supple. No tracheal deviation present.  Cardiovascular: Normal rate, regular rhythm and intact distal pulses.  No extrasystoles are present. PMI is not displaced. Exam reveals no gallop and no friction rub.  Murmur heard. Pulses:      Radial pulses are 2+ on the right side, and 2+ on the left side.  2+ radial pulses No LE edema  Pulmonary/Chest: Effort normal. No accessory muscle usage or stridor. No tachypnea. No respiratory distress. He has no decreased breath sounds. He has no wheezes. He has no rhonchi. He has rales (bibasilar rales/coarse crackles). He exhibits no tenderness.  Abdominal: Soft. Normal appearance and bowel sounds are normal. He exhibits no distension. There is no tenderness.  Musculoskeletal: Normal range of motion. He exhibits no edema.  Lymphadenopathy:    He has no cervical adenopathy.  Neurological: He is alert and oriented to person, place, and time. He exhibits normal muscle tone. Coordination and gait normal.  Skin: Skin is warm, dry and intact. Capillary refill takes less than 2 seconds. No rash noted. He is not diaphoretic. No cyanosis. No pallor. Nails show no  clubbing.  Psychiatric: He has a normal mood and affect. His speech is normal and behavior is normal.  Nursing note and vitals reviewed.         Assessment & Plan:  ICD-10-CM   1. Sinusitis, unspecified chronicity, unspecified location J32.9 mometasone (NASONEX) 50 MCG/ACT nasal spray    Cetirizine HCl (ZYRTEC ALLERGY) 10 MG TBDP   Possibly allergies + viral illness, doubt acute bacterial sinusitis - no change with multiple abx tx over the past 4 weeks  2. Community acquired pneumonia, unspecified laterality J18.9 DG Chest 2 View     Patient is a 76 year old male, former smoker, has history of complete heart block and pacemaker, presents for nasal drainage that is blood-tinged, has been ongoing for about a month, also presents for URI and pneumonia which was treated aggressively with multiple visits over the last month.    Patient vital signs are stable, he is well-appearing, he does appear to have very irritated sinuses that are erythematous, some raw areas with profuse clear nasal discharge he has no sinus tenderness.  Since he has been on 70 antibiotics and he does not currently look like acute bacterial sinusitis feel that we should treat his sinuses with some antihistamines and steroid nasal sprays also to help prevent nosebleeds he was instructed to use frequent saline nasal spray cool mist or humidifier at night.  He was instructed to follow-up with any worsening or new fevers.  In reviewing chart from urgent care appears that he was treated very aggressively for community-acquired pneumonia, he had cefdinir, Doxy for what appears to be 2 weeks and 4 Rocephin injections.  I cannot see chest x-ray with there records.  On exam he does have bibasilar crackles which the patient states has been there noticed by Dr. Dennard Schaumann about 2 months ago with a negative chest x-ray.  He does not have any concerning symptoms right now, no shortness of breath, wheeze, night sweats, chest pain, weight  loss.  The urgent care advised patient to do a follow-up chest x-ray in 2 weeks but I have asked the patient to do it with [facility] imaging and follow-up here with his PCP so that he can be rechecked after all of this treatment.  I would not currently initiate any work-up for the crackles he is Artie been seen by his doctor and currently does not have any new acute or concerning symptoms at this time.  Pt is in agreement with follow-up plan with Dr. Dennard Schaumann.   Delsa Grana, PA-C 08/06/18 11:18 AM

## 2018-08-06 NOTE — Patient Instructions (Addendum)
Do steroid nasal sprays and antihistamine + saline sinus sprays to help sinus drainage and irritation  Follow up sooner if any new fever, worsening pain and pressure

## 2018-08-15 ENCOUNTER — Ambulatory Visit
Admission: RE | Admit: 2018-08-15 | Discharge: 2018-08-15 | Disposition: A | Discharge status: Home / Self Care | Payer: Medicare Other | Source: Ambulatory Visit | Attending: Family Medicine | Admitting: Family Medicine

## 2018-08-15 DIAGNOSIS — J189 Pneumonia, unspecified organism: Secondary | ICD-10-CM

## 2018-08-15 DIAGNOSIS — R05 Cough: Secondary | ICD-10-CM | POA: Diagnosis not present

## 2018-08-22 NOTE — Progress Notes (Signed)
This pt came in a few weeks ago for sinusitis - he had some mild coughing, He said he had been to urgent care and treated multiple times for CAP - even was going back several times for rocephin shots.  I heard the crackles, reviewed your visit, saw those crackles were already there.  I suspect UC thought persistent crackles were pneumonia.  I had him to a follow up CXR since he said UC said a CXR was + for pneumonia.   Feel like with the CXR changes at the bases he may need the CT scan - can you see what you would like to do next for him?  thanks

## 2018-08-26 ENCOUNTER — Other Ambulatory Visit: Payer: Self-pay | Admitting: Family Medicine

## 2018-08-26 DIAGNOSIS — D225 Melanocytic nevi of trunk: Secondary | ICD-10-CM | POA: Diagnosis not present

## 2018-08-26 DIAGNOSIS — D485 Neoplasm of uncertain behavior of skin: Secondary | ICD-10-CM | POA: Diagnosis not present

## 2018-08-26 DIAGNOSIS — L905 Scar conditions and fibrosis of skin: Secondary | ICD-10-CM | POA: Diagnosis not present

## 2018-08-26 DIAGNOSIS — L814 Other melanin hyperpigmentation: Secondary | ICD-10-CM | POA: Diagnosis not present

## 2018-08-26 DIAGNOSIS — L821 Other seborrheic keratosis: Secondary | ICD-10-CM | POA: Diagnosis not present

## 2018-08-26 DIAGNOSIS — L820 Inflamed seborrheic keratosis: Secondary | ICD-10-CM | POA: Diagnosis not present

## 2018-08-26 NOTE — Addendum Note (Signed)
Addended by: Delsa Grana on: 08/26/2018 11:45 AM   Modules accepted: Orders

## 2018-08-26 NOTE — Progress Notes (Unsigned)
Need follow up CT chest for months of persistent bibasilar fine rales, CXR changes, environmental exposure.

## 2018-08-27 ENCOUNTER — Ambulatory Visit: Payer: Medicare Other | Admitting: Family Medicine

## 2018-08-29 ENCOUNTER — Ambulatory Visit
Admission: RE | Admit: 2018-08-29 | Discharge: 2018-08-29 | Disposition: A | Discharge status: Home / Self Care | Payer: 59 | Source: Ambulatory Visit | Attending: Family Medicine | Admitting: Family Medicine

## 2018-08-29 NOTE — Progress Notes (Signed)
Pt is seeing you tomorrow - are you going to address his rales and CT chest results or would you like me to do a result note?

## 2018-08-30 ENCOUNTER — Ambulatory Visit (INDEPENDENT_AMBULATORY_CARE_PROVIDER_SITE_OTHER): Payer: Medicare PPO | Admitting: Family Medicine

## 2018-08-30 ENCOUNTER — Encounter: Payer: Self-pay | Admitting: Family Medicine

## 2018-08-30 VITALS — BP 130/70 | HR 70 | Temp 97.9°F | Resp 16 | Ht 72.0 in | Wt 197.0 lb

## 2018-08-30 DIAGNOSIS — J61 Pneumoconiosis due to asbestos and other mineral fibers: Secondary | ICD-10-CM
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
## 2018-09-02 ENCOUNTER — Ambulatory Visit (INDEPENDENT_AMBULATORY_CARE_PROVIDER_SITE_OTHER): Payer: Medicare PPO

## 2018-09-02 DIAGNOSIS — I442 Atrioventricular block, complete: Secondary | ICD-10-CM | POA: Diagnosis not present

## 2018-09-03 LAB — CUP PACEART REMOTE DEVICE CHECK
Battery Remaining Longevity: 124 mo
Battery Remaining Percentage: 95.5 %
Battery Voltage: 2.99 V
Brady Statistic AP VP Percent: 1 %
Brady Statistic AP VS Percent: 8.4 %
Brady Statistic AS VP Percent: 1 %
Brady Statistic AS VS Percent: 92 %
Brady Statistic RA Percent Paced: 8.2 %
Brady Statistic RV Percent Paced: 1 %
Date Time Interrogation Session: 20200106070014
Implantable Lead Implant Date: 20150501
Implantable Lead Implant Date: 20150501
Implantable Lead Location: 753859
Implantable Lead Location: 753860
Implantable Lead Model: 1948
Implantable Pulse Generator Implant Date: 20150501
Lead Channel Impedance Value: 450 Ohm
Lead Channel Impedance Value: 580 Ohm
Lead Channel Pacing Threshold Amplitude: 0.75 V
Lead Channel Pacing Threshold Amplitude: 0.75 V
Lead Channel Pacing Threshold Pulse Width: 0.5 ms
Lead Channel Pacing Threshold Pulse Width: 0.5 ms
Lead Channel Sensing Intrinsic Amplitude: 11.2 mV
Lead Channel Sensing Intrinsic Amplitude: 5 mV
Lead Channel Setting Pacing Amplitude: 2 V
Lead Channel Setting Pacing Amplitude: 2.5 V
Lead Channel Setting Pacing Pulse Width: 0.5 ms
Lead Channel Setting Sensing Sensitivity: 2 mV
Pulse Gen Model: 2240
Pulse Gen Serial Number: 7618665

## 2018-09-03 NOTE — Progress Notes (Signed)
Remote pacemaker transmission.   

## 2018-09-18 ENCOUNTER — Encounter: Payer: Self-pay | Admitting: Pulmonary Disease

## 2018-09-18 ENCOUNTER — Ambulatory Visit: Payer: Medicare PPO | Admitting: Pulmonary Disease

## 2018-09-18 DIAGNOSIS — J849 Interstitial pulmonary disease, unspecified: Secondary | ICD-10-CM

## 2018-09-18 DIAGNOSIS — J61 Pneumoconiosis due to asbestos and other mineral fibers: Secondary | ICD-10-CM

## 2018-09-18 NOTE — Assessment & Plan Note (Addendum)
  HRCT is indeterminate for UIP due to lack of honeycombing however distribution does appear to be peripheral and predominantly basal so this could still be UIP. Major differential here would be asbestosis PFTs next visit Would also obtain serology with ANA, CCP and ESR, he does not have any stigmata of collagen vascular disease

## 2018-09-18 NOTE — Assessment & Plan Note (Signed)
He clearly has pleural plaques which would be consistent with asbestos exposure.  He also has a history of asbestos exposure No reason to suspect mesothelioma at this time

## 2018-09-18 NOTE — Patient Instructions (Signed)
You have pleural plaques [over the covering of your lungs] which indicates asbestos exposure. You have fibrosis in your lung which may also be related to asbestos or other cause  Blood work today. PFTs next visit
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
## 2018-09-19 LAB — SEDIMENTATION RATE: Sed Rate: 13 mm/hr (ref 0–20)

## 2018-09-20 LAB — ANGIOTENSIN CONVERTING ENZYME: Angiotensin-Converting Enzyme: <1 U/L — ABNORMAL LOW (ref 9–67)

## 2018-09-20 LAB — CYCLIC CITRUL PEPTIDE ANTIBODY, IGG: Cyclic Citrullin Peptide Ab: <16 UNITS

## 2018-09-20 LAB — ANA: Anti Nuclear Antibody(ANA): NEGATIVE

## 2018-10-18 ENCOUNTER — Encounter: Payer: Self-pay | Admitting: Pulmonary Disease

## 2018-10-18 ENCOUNTER — Ambulatory Visit: Payer: Medicare PPO | Admitting: Pulmonary Disease

## 2018-10-18 ENCOUNTER — Ambulatory Visit (INDEPENDENT_AMBULATORY_CARE_PROVIDER_SITE_OTHER): Payer: Medicare PPO | Admitting: Pulmonary Disease

## 2018-10-18 DIAGNOSIS — J849 Interstitial pulmonary disease, unspecified: Secondary | ICD-10-CM

## 2018-10-18 DIAGNOSIS — J61 Pneumoconiosis due to asbestos and other mineral fibers: Secondary | ICD-10-CM

## 2018-10-18 LAB — PULMONARY FUNCTION TEST
DL/VA % pred: 113 %
DL/VA: 4.41 ml/min/mmHg/L
DLCO unc % pred: 76 %
DLCO unc: 19.89 ml/min/mmHg
FEF 25-75 Post: 4.04 L/sec
FEF 25-75 Pre: 3.49 L/sec
FEF2575-%Change-Post: 15 %
FEF2575-%Pred-Post: 182 %
FEF2575-%Pred-Pre: 157 %
FEV1-%Change-Post: 2 %
FEV1-%Pred-Post: 82 %
FEV1-%Pred-Pre: 80 %
FEV1-Post: 2.63 L
FEV1-Pre: 2.55 L
FEV1FVC-%Change-Post: 3 %
FEV1FVC-%Pred-Pre: 119 %
FEV6-%Change-Post: 0 %
FEV6-%Pred-Post: 71 %
FEV6-%Pred-Pre: 72 %
FEV6-Post: 2.96 L
FEV6-Pre: 2.99 L
FEV6FVC-%Pred-Post: 106 %
FEV6FVC-%Pred-Pre: 106 %
FVC-%Change-Post: 0 %
FVC-%Pred-Post: 67 %
FVC-%Pred-Pre: 67 %
FVC-Post: 2.96 L
FVC-Pre: 2.99 L
Post FEV1/FVC ratio: 89 %
Post FEV6/FVC ratio: 100 %
Pre FEV1/FVC ratio: 86 %
Pre FEV6/FVC Ratio: 100 %
RV % pred: 47 %
RV: 1.31 L
TLC % pred: 57 %
TLC: 4.32 L

## 2018-10-18 NOTE — Assessment & Plan Note (Signed)
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

## 2018-10-18 NOTE — Progress Notes (Signed)
Patient completed full PFT today. 
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
## 2018-10-18 NOTE — Assessment & Plan Note (Signed)
Favor idiopathic pulmonary fibrosis even though HRCT was indeterminate for UIP Lung capacity is around 67% but DLCO relatively maintained at 76%  We discussed role of anti-fibrotic medications and decided to hold off for now He does not like the idea of liver toxicity or nausea or diarrhea  Reassess with HRCT in 6 months

## 2018-10-18 NOTE — Patient Instructions (Signed)
You have pulmonary fibrosis. Lung capacity is around 67%  We discussed role of anti-fibrotic medications and decided to hold off for now

## 2018-12-02 ENCOUNTER — Other Ambulatory Visit: Payer: Self-pay

## 2018-12-02 ENCOUNTER — Telehealth: Payer: Self-pay

## 2018-12-02 ENCOUNTER — Ambulatory Visit (INDEPENDENT_AMBULATORY_CARE_PROVIDER_SITE_OTHER): Payer: Medicare PPO | Admitting: *Deleted

## 2018-12-02 DIAGNOSIS — I442 Atrioventricular block, complete: Secondary | ICD-10-CM | POA: Diagnosis not present

## 2018-12-02 LAB — CUP PACEART REMOTE DEVICE CHECK
Battery Remaining Longevity: 125 mo
Battery Remaining Percentage: 95.5 %
Battery Voltage: 2.99 V
Brady Statistic AP VP Percent: 1 %
Brady Statistic AP VS Percent: 7.1 %
Brady Statistic AS VP Percent: 1 %
Brady Statistic AS VS Percent: 93 %
Brady Statistic RA Percent Paced: 6.9 %
Brady Statistic RV Percent Paced: 1 %
Date Time Interrogation Session: 20200406191637
Implantable Lead Implant Date: 20150501
Implantable Lead Implant Date: 20150501
Implantable Lead Location: 753859
Implantable Lead Location: 753860
Implantable Lead Model: 1948
Implantable Pulse Generator Implant Date: 20150501
Lead Channel Impedance Value: 460 Ohm
Lead Channel Impedance Value: 610 Ohm
Lead Channel Pacing Threshold Amplitude: 0.75 V
Lead Channel Pacing Threshold Amplitude: 0.75 V
Lead Channel Pacing Threshold Pulse Width: 0.5 ms
Lead Channel Pacing Threshold Pulse Width: 0.5 ms
Lead Channel Sensing Intrinsic Amplitude: 12 mV
Lead Channel Sensing Intrinsic Amplitude: 5 mV
Lead Channel Setting Pacing Amplitude: 2 V
Lead Channel Setting Pacing Amplitude: 2.5 V
Lead Channel Setting Pacing Pulse Width: 0.5 ms
Lead Channel Setting Sensing Sensitivity: 2 mV
Pulse Gen Model: 2240
Pulse Gen Serial Number: 7618665

## 2018-12-02 NOTE — Telephone Encounter (Signed)
Spoke with patient to remind of missed remote transmission 

## 2018-12-09 ENCOUNTER — Encounter: Payer: Self-pay | Admitting: Cardiology

## 2018-12-09 NOTE — Progress Notes (Signed)
Remote pacemaker transmission.   

## 2018-12-25 ENCOUNTER — Telehealth: Payer: Self-pay

## 2018-12-25 NOTE — Telephone Encounter (Signed)
Left message regarding appt on 12/27/18. 

## 2018-12-25 NOTE — Telephone Encounter (Signed)
Follow Up:; ° ° °Returning your call. °

## 2018-12-27 ENCOUNTER — Encounter: Payer: Self-pay | Admitting: Internal Medicine

## 2018-12-27 ENCOUNTER — Telehealth (INDEPENDENT_AMBULATORY_CARE_PROVIDER_SITE_OTHER): Payer: Medicare PPO | Admitting: Internal Medicine

## 2018-12-27 DIAGNOSIS — Z950 Presence of cardiac pacemaker: Secondary | ICD-10-CM | POA: Diagnosis not present

## 2018-12-27 DIAGNOSIS — I441 Atrioventricular block, second degree: Secondary | ICD-10-CM

## 2018-12-27 DIAGNOSIS — I10 Essential (primary) hypertension: Secondary | ICD-10-CM | POA: Diagnosis not present
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
## 2019-01-21 ENCOUNTER — Ambulatory Visit: Payer: Medicare PPO | Admitting: Pulmonary Disease

## 2019-03-03 ENCOUNTER — Ambulatory Visit (INDEPENDENT_AMBULATORY_CARE_PROVIDER_SITE_OTHER): Payer: Medicare PPO | Admitting: *Deleted

## 2019-03-03 DIAGNOSIS — I442 Atrioventricular block, complete: Secondary | ICD-10-CM | POA: Diagnosis not present

## 2019-03-03 LAB — CUP PACEART REMOTE DEVICE CHECK
Date Time Interrogation Session: 20200706124104
Implantable Lead Implant Date: 20150501
Implantable Lead Implant Date: 20150501
Implantable Lead Location: 753859
Implantable Lead Location: 753860
Implantable Lead Model: 1948
Implantable Pulse Generator Implant Date: 20150501
Pulse Gen Model: 2240
Pulse Gen Serial Number: 7618665

## 2019-03-10 ENCOUNTER — Encounter: Payer: Self-pay | Admitting: Cardiology

## 2019-03-10 NOTE — Progress Notes (Signed)
Remote pacemaker transmission.   

## 2019-03-18 ENCOUNTER — Telehealth: Payer: Self-pay | Admitting: Pulmonary Disease

## 2019-03-18 NOTE — Telephone Encounter (Signed)
Spoke with pts spouse. Pt was due for f/u in May. Pt does not feel comfortable coming into office at this time. She says his breathing is unchanged since last visit. Explained out process thru out the office. They are afraid to come out at this time during [place]. I did make them aware due for another HRCT. They would like to postpone as long as possible end August. She will call back next month to schedule follow up appt.  221/20 office visit: Favor idiopathic pulmonary fibrosis even though HRCT was indeterminate for UIP Lung capacity is around 67% but DLCO relatively maintained at 76%  We discussed role of anti-fibrotic medications and decided to hold off for now He does not like the idea of liver toxicity or nausea or diarrhea  Reassess with HRCT in 6 months

## 2019-03-19 NOTE — Telephone Encounter (Signed)
Okay to defer appointment and follow-up CT 2 September

## 2019-03-19 NOTE — Telephone Encounter (Signed)
Noted. Message has been routed to RA. Will close encounter. If he has any further recommendations, we will address at this time.

## 2019-04-14 ENCOUNTER — Telehealth: Payer: Self-pay

## 2019-04-14 NOTE — Telephone Encounter (Signed)
Please call Marks Scalera in regards to recent bills that her husband received.  [phone].

## 2019-05-14 ENCOUNTER — Ambulatory Visit (INDEPENDENT_AMBULATORY_CARE_PROVIDER_SITE_OTHER): Payer: Medicare PPO | Admitting: Family Medicine

## 2019-05-14 ENCOUNTER — Other Ambulatory Visit: Payer: Self-pay

## 2019-05-14 DIAGNOSIS — R3121 Asymptomatic microscopic hematuria: Secondary | ICD-10-CM | POA: Diagnosis not present

## 2019-05-14 DIAGNOSIS — Z23 Encounter for immunization: Secondary | ICD-10-CM

## 2019-05-14 DIAGNOSIS — N401 Enlarged prostate with lower urinary tract symptoms: Secondary | ICD-10-CM | POA: Diagnosis not present

## 2019-05-14 DIAGNOSIS — R3915 Urgency of urination: Secondary | ICD-10-CM | POA: Diagnosis not present

## 2019-05-14 DIAGNOSIS — N201 Calculus of ureter: Secondary | ICD-10-CM | POA: Diagnosis not present

## 2019-05-14 DIAGNOSIS — N5201 Erectile dysfunction due to arterial insufficiency: Secondary | ICD-10-CM | POA: Diagnosis not present

## 2019-05-20 ENCOUNTER — Telehealth: Payer: Self-pay | Admitting: *Deleted

## 2019-05-20 NOTE — Telephone Encounter (Signed)
   [facility] Pre-operative Risk Assessment    Request for surgical clearance:  1. What type of surgery is being performed? LITHROTRIPSY   2. When is this surgery scheduled? TBD   3. What type of clearance is required (medical clearance vs. Pharmacy clearance to hold med vs. Both)? MEDICAL AND DEVICE CLEARANCE; DEVICE CLEARANCE FORM HAS BEEN GIVEN TO DEVICE CLINIC TO FILL OUT  4. Are there any medications that need to be held prior to surgery and how long? ASA    5. Practice name and name of physician performing surgery? [facility]; DR. Annie Main DAHLSTEDT   6. What is your office phone number [phone]    7.   What is your office fax number [phone]  8.   Anesthesia type (None, local, MAC, general) ? NOT LISTED    Julaine Hua 05/20/2019, 2:32 PM  _________________________________________________________________   (provider comments below)

## 2019-05-21 NOTE — Telephone Encounter (Signed)
Form received from [facility] states they are requesting to hold ASA x3 days prior to procedure.   Device peri-op clearance form faxed back to [facility] [phone]), confirmation received.

## 2019-05-21 NOTE — Telephone Encounter (Signed)
Left message for the patient to call back to speak to the on call preop APP of the day.

## 2019-05-22 NOTE — Telephone Encounter (Signed)
   Primary Cardiologist: Kate Sable, MD  Chart reviewed as part of pre-operative protocol coverage. Patient was contacted 05/22/2019 in reference to pre-operative risk assessment for pending surgery as outlined below.  Edgar Evans was last seen on 12/27/18 by Dr. Rayann Heman.  Since that day, Edgar Evans has done well.  He does not have a history of CAD or stroke. He can complete 4.0 METS.   We typically recommend continuing ASA during the perioperative period. However, as stated above, no history of MI or stroke. OK to hold ASA 5-7 days.  Therefore, based on ACC/AHA guidelines, the patient would be at acceptable risk for the planned procedure without further cardiovascular testing.   I will route this recommendation to the requesting party via Epic fax function and remove from pre-op pool.  Please call with questions.  Edgar Lin Azekiel Cremer, PA 05/22/2019, 9:11 AM

## 2019-05-23 ENCOUNTER — Encounter: Payer: Self-pay | Admitting: Family Medicine

## 2019-05-23 ENCOUNTER — Other Ambulatory Visit: Payer: Self-pay | Admitting: Urology

## 2019-05-23 DIAGNOSIS — K862 Cyst of pancreas: Secondary | ICD-10-CM | POA: Insufficient documentation

## 2019-05-28 ENCOUNTER — Telehealth: Payer: Self-pay

## 2019-05-28 NOTE — Telephone Encounter (Signed)
Spoke with Edgar Evans and informed her last patient visit was a virtual visit due to Covid-19. ECG will be done at [facility] before procedure.

## 2019-05-28 NOTE — Telephone Encounter (Signed)
Edgar Evans from [name] called wanting a recent EKG from the pt in the office. Her phone number [phone]

## 2019-05-28 NOTE — Anesthesia Preprocedure Evaluation (Addendum)
Anesthesia Evaluation  Patient identified by MRN, date of birth, ID band Patient awake    Reviewed: Allergy & Precautions, NPO status , Patient's Chart, lab work & pertinent test results  History of Anesthesia Complications Negative for: history of anesthetic complications  Airway Mallampati: II  TM Distance: >3 FB Neck ROM: Full    Dental  (+) Upper Dentures, Partial Lower   Pulmonary former smoker,    Pulmonary exam normal        Cardiovascular hypertension, Pt. on medications Normal cardiovascular exam+ dysrhythmias (3rd degree AVB) + pacemaker      Neuro/Psych negative neurological ROS  negative psych ROS   GI/Hepatic Neg liver ROS, GERD  ,  Endo/Other  diabetes, Type 2, Oral Hypoglycemic Agents  Renal/GU negative Renal ROS  negative genitourinary   Musculoskeletal  (+) Arthritis ,   Abdominal   Peds  Hematology negative hematology ROS (+)   Anesthesia Other Findings Day of surgery medications reviewed with patient.  Reproductive/Obstetrics negative OB ROS                            Anesthesia Physical Anesthesia Plan  ASA: III  Anesthesia Plan: MAC   Post-op Pain Management:    Induction:   PONV Risk Score and Plan: 1 and Treatment may vary due to age or medical condition and Propofol infusion  Airway Management Planned: Natural Airway and Simple Face Mask  Additional Equipment: None  Intra-op Plan:   Post-operative Plan:   Informed Consent: I have reviewed the patients History and Physical, chart, labs and discussed the procedure including the risks, benefits and alternatives for the proposed anesthesia with the patient or authorized representative who has indicated his/her understanding and acceptance.     Dental advisory given  Plan Discussed with: CRNA  Anesthesia Plan Comments:        Anesthesia Quick Evaluation

## 2019-05-29 ENCOUNTER — Ambulatory Visit (HOSPITAL_COMMUNITY): Payer: Medicare PPO | Admitting: Anesthesiology

## 2019-05-29 ENCOUNTER — Encounter (HOSPITAL_COMMUNITY)
Admission: RE | Disposition: A | Discharge status: Home / Self Care | Payer: Self-pay | Source: Home / Self Care | Attending: Urology

## 2019-05-29 ENCOUNTER — Encounter (HOSPITAL_COMMUNITY): Payer: Self-pay | Admitting: Certified Registered Nurse Anesthetist

## 2019-05-29 ENCOUNTER — Ambulatory Visit (HOSPITAL_COMMUNITY): Payer: Medicare PPO

## 2019-05-29 ENCOUNTER — Other Ambulatory Visit (HOSPITAL_COMMUNITY)
Admission: RE | Admit: 2019-05-29 | Discharge: 2019-05-29 | Disposition: A | Discharge status: Home / Self Care | Payer: Medicare PPO | Source: Ambulatory Visit | Attending: Urology | Admitting: Urology

## 2019-05-29 ENCOUNTER — Ambulatory Visit (HOSPITAL_COMMUNITY)
Admission: RE | Admit: 2019-05-29 | Discharge: 2019-05-29 | Disposition: A | Discharge status: Home / Self Care | Payer: Medicare PPO | Attending: Urology | Admitting: Urology

## 2019-05-29 DIAGNOSIS — Z87891 Personal history of nicotine dependence: Secondary | ICD-10-CM | POA: Insufficient documentation

## 2019-05-29 DIAGNOSIS — M1990 Unspecified osteoarthritis, unspecified site: Secondary | ICD-10-CM | POA: Insufficient documentation

## 2019-05-29 DIAGNOSIS — I442 Atrioventricular block, complete: Secondary | ICD-10-CM | POA: Diagnosis not present

## 2019-05-29 DIAGNOSIS — Z950 Presence of cardiac pacemaker: Secondary | ICD-10-CM | POA: Insufficient documentation

## 2019-05-29 DIAGNOSIS — I10 Essential (primary) hypertension: Secondary | ICD-10-CM | POA: Diagnosis not present

## 2019-05-29 DIAGNOSIS — N201 Calculus of ureter: Secondary | ICD-10-CM | POA: Diagnosis not present

## 2019-05-29 DIAGNOSIS — Z79899 Other long term (current) drug therapy: Secondary | ICD-10-CM | POA: Insufficient documentation

## 2019-05-29 DIAGNOSIS — E119 Type 2 diabetes mellitus without complications: Secondary | ICD-10-CM | POA: Insufficient documentation

## 2019-05-29 DIAGNOSIS — Z01818 Encounter for other preprocedural examination: Secondary | ICD-10-CM | POA: Diagnosis not present

## 2019-05-29 DIAGNOSIS — Z887 Allergy status to serum and vaccine status: Secondary | ICD-10-CM | POA: Insufficient documentation

## 2019-05-29 DIAGNOSIS — Z7984 Long term (current) use of oral hypoglycemic drugs: Secondary | ICD-10-CM | POA: Diagnosis not present

## 2019-05-29 DIAGNOSIS — Z20828 Contact with and (suspected) exposure to other viral communicable diseases: Secondary | ICD-10-CM | POA: Diagnosis not present

## 2019-05-29 HISTORY — PX: EXTRACORPOREAL SHOCK WAVE LITHOTRIPSY: SHX1557

## 2019-05-29 LAB — GLUCOSE, CAPILLARY: Glucose-Capillary: 135 mg/dL — ABNORMAL HIGH (ref 70–99)

## 2019-05-29 LAB — SARS CORONAVIRUS 2 BY RT PCR (HOSPITAL ORDER, PERFORMED IN ~~LOC~~ HOSPITAL LAB): SARS Coronavirus 2: NEGATIVE

## 2019-05-29 SURGERY — LITHOTRIPSY, ESWL
Anesthesia: Monitor Anesthesia Care | Laterality: Left

## 2019-05-29 MED ORDER — TAMSULOSIN HCL 0.4 MG PO CAPS: 0.4000 mg | ORAL_CAPSULE | Freq: Every day | ORAL | 0 refills | Status: DC

## 2019-05-29 MED ORDER — PROPOFOL 10 MG/ML IV BOLUS
INTRAVENOUS | Status: AC | PRN
  Administered 2019-05-29: 600 mg via INTRAVENOUS

## 2019-05-29 MED ORDER — HYDROCODONE-ACETAMINOPHEN 5-325 MG PO TABS: 1.0000 | ORAL_TABLET | Freq: Four times a day (QID) | ORAL | 0 refills | Status: DC | PRN

## 2019-05-29 MED ORDER — DIAZEPAM 5 MG PO TABS: 10.0000 mg | ORAL_TABLET | ORAL | Status: DC

## 2019-05-29 MED ORDER — ONDANSETRON HCL 4 MG PO TABS: 4.0000 mg | ORAL_TABLET | Freq: Every day | ORAL | 1 refills | Status: DC | PRN

## 2019-05-29 MED ORDER — FENTANYL CITRATE (PF) 100 MCG/2ML IJ SOLN
INTRAMUSCULAR | Status: AC
  Filled 2019-05-29: qty 2

## 2019-05-29 MED ORDER — PROPOFOL 10 MG/ML IV BOLUS
INTRAVENOUS | Status: AC
  Filled 2019-05-29: qty 60

## 2019-05-29 MED ORDER — DIPHENHYDRAMINE HCL 25 MG PO CAPS: 25.0000 mg | ORAL_CAPSULE | ORAL | Status: DC

## 2019-05-29 MED ORDER — PROPOFOL 500 MG/50ML IV EMUL
INTRAVENOUS | Status: AC
  Filled 2019-05-29: qty 50

## 2019-05-29 MED ORDER — CIPROFLOXACIN HCL 500 MG PO TABS
500.0000 mg | ORAL_TABLET | ORAL | Status: AC
  Administered 2019-05-29: 500 mg via ORAL
  Filled 2019-05-29: qty 1

## 2019-05-29 MED ORDER — FENTANYL CITRATE (PF) 100 MCG/2ML IJ SOLN
INTRAMUSCULAR | Status: AC | PRN
  Administered 2019-05-29: 100 ug via INTRAVENOUS

## 2019-05-29 MED ORDER — LACTATED RINGERS IV SOLN
INTRAVENOUS | Status: DC
  Administered 2019-05-29: 10:00:00 via INTRAVENOUS

## 2019-05-29 MED ORDER — SODIUM CHLORIDE 0.9 % IV SOLN: INTRAVENOUS | Status: DC

## 2019-05-29 NOTE — Discharge Instructions (Signed)

## 2019-05-30 ENCOUNTER — Encounter (HOSPITAL_COMMUNITY): Payer: Self-pay | Admitting: Urology

## 2019-06-02 ENCOUNTER — Ambulatory Visit (INDEPENDENT_AMBULATORY_CARE_PROVIDER_SITE_OTHER): Payer: Medicare PPO | Admitting: *Deleted

## 2019-06-02 DIAGNOSIS — I442 Atrioventricular block, complete: Secondary | ICD-10-CM

## 2019-06-02 DIAGNOSIS — I459 Conduction disorder, unspecified: Secondary | ICD-10-CM

## 2019-06-02 LAB — CUP PACEART REMOTE DEVICE CHECK
Battery Remaining Longevity: 125 mo
Battery Remaining Percentage: 95.5 %
Battery Voltage: 2.99 V
Brady Statistic AP VP Percent: 1 %
Brady Statistic AP VS Percent: 7.6 %
Brady Statistic AS VP Percent: 1 %
Brady Statistic AS VS Percent: 92 %
Brady Statistic RA Percent Paced: 7.4 %
Brady Statistic RV Percent Paced: 1 %
Date Time Interrogation Session: 20201005063257
Implantable Lead Implant Date: 20150501
Implantable Lead Implant Date: 20150501
Implantable Lead Location: 753859
Implantable Lead Location: 753860
Implantable Lead Model: 1948
Implantable Pulse Generator Implant Date: 20150501
Lead Channel Impedance Value: 430 Ohm
Lead Channel Impedance Value: 650 Ohm
Lead Channel Pacing Threshold Amplitude: 0.75 V
Lead Channel Pacing Threshold Amplitude: 0.75 V
Lead Channel Pacing Threshold Pulse Width: 0.5 ms
Lead Channel Pacing Threshold Pulse Width: 0.5 ms
Lead Channel Sensing Intrinsic Amplitude: 12 mV
Lead Channel Sensing Intrinsic Amplitude: 5 mV
Lead Channel Setting Pacing Amplitude: 2 V
Lead Channel Setting Pacing Amplitude: 2.5 V
Lead Channel Setting Pacing Pulse Width: 0.5 ms
Lead Channel Setting Sensing Sensitivity: 2 mV
Pulse Gen Model: 2240
Pulse Gen Serial Number: 7618665

## 2019-06-09 NOTE — Progress Notes (Signed)
Remote pacemaker transmission.   

## 2019-06-19 DIAGNOSIS — N201 Calculus of ureter: Secondary | ICD-10-CM | POA: Diagnosis not present

## 2019-06-25 ENCOUNTER — Encounter: Payer: Self-pay | Admitting: Pulmonary Disease

## 2019-06-25 ENCOUNTER — Ambulatory Visit (INDEPENDENT_AMBULATORY_CARE_PROVIDER_SITE_OTHER): Payer: Medicare PPO

## 2019-06-25 ENCOUNTER — Other Ambulatory Visit: Payer: Self-pay

## 2019-06-25 ENCOUNTER — Ambulatory Visit: Payer: Medicare PPO | Admitting: Pulmonary Disease

## 2019-06-25 VITALS — BP 130/70 | HR 74 | Temp 97.2°F | Ht 72.0 in | Wt 191.4 lb

## 2019-06-25 DIAGNOSIS — J61 Pneumoconiosis due to asbestos and other mineral fibers: Secondary | ICD-10-CM

## 2019-06-25 DIAGNOSIS — J849 Interstitial pulmonary disease, unspecified: Secondary | ICD-10-CM | POA: Diagnosis not present

## 2019-06-25 NOTE — Patient Instructions (Signed)
Chest x-ray today.  Schedule high-resolution CT chest and PFTs before next visit in February

## 2019-06-25 NOTE — Assessment & Plan Note (Signed)
Appears stable by history Schedule high-resolution CT chest and PFTs before next visit in February  Differential diagnosis here is IPF with atypical imaging or asbestosis

## 2019-06-25 NOTE — Assessment & Plan Note (Signed)
As indicated by pleural plaques

## 2019-06-25 NOTE — Progress Notes (Signed)
   Subjective:    Patient ID: Edgar Evans, male    DOB: [birth year], 78 y.o.   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  He is a remote smoker and smoked about 2 packs/day before he quit in 1985, about 40 pack years. 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  61-monthfollow-up, he states that his dyspnea is unchanged. Denies cough.  He is maintained on lisinopril for many years and denies upper airway symptoms We reviewed his prior HRCT and PFTs He had CT abdomen done 05/14/2019 which is reported as bibasilar scarring  Significant tests/ events reviewed  HRCT  08/29/2018 It showed bilateral calcified and noncalcified pleural plaques most evident over the right hemidiaphragm.  patchy areas of peripheral predominant groundglass attenuation and septal thickening mostly in the mid to lower lungs with minimal bronchiectasis, no definite honeycombing hence called "indeterminate" for UIP.   ANA neg, CCP neg, ACE <1, ESR 13  PFTs 09/2018 -FVC 67%, FEV1 80%, ratio 86 TLC 57% , DLCO 76%, 19.89  Review of Systems neg for any significant sore throat, dysphagia, itching, sneezing, nasal congestion or excess/ purulent secretions, fever, chills, sweats, unintended wt loss, pleuritic or exertional cp, hempoptysis, orthopnea pnd or change in chronic leg swelling. Also denies presyncope, palpitations, heartburn, abdominal pain, nausea, vomiting, diarrhea or change in bowel or urinary habits, dysuria,hematuria, rash, arthralgias, visual complaints, headache, numbness weakness or ataxia.     Objective:   Physical Exam  Gen. Pleasant, well-nourished, in no distress ENT - no thrush, no pallor/icterus,no post nasal drip Neck: No JVD, no thyromegaly, no carotid bruits Lungs: no use of accessory muscles, no dullness to  percussion, bibasilar rales no rhonchi  Cardiovascular: Rhythm regular, heart sounds  normal, no murmurs or gallops, no peripheral edema Musculoskeletal: No deformities, no cyanosis or clubbing        Assessment & Plan:

## 2019-06-30 ENCOUNTER — Telehealth: Payer: Self-pay | Admitting: Pulmonary Disease

## 2019-06-30 NOTE — Telephone Encounter (Signed)
Pt requesting a copy of his most recent PFT and chest CT to be mailed to him.  Verified address on file.  This has been sent as requested.  Nothing further needed at this time- will close encounter.

## 2019-07-16 ENCOUNTER — Telehealth: Payer: Self-pay | Admitting: Pulmonary Disease

## 2019-07-16 NOTE — Telephone Encounter (Signed)
Call returned to patient, confirmed DOB, requesting his chest x-ray disc. I made him aware they were not produced here in office and he would need to contact medical records. Number given. Voiced understanding.   Nothing further needed at this time.

## 2019-07-31 DIAGNOSIS — N201 Calculus of ureter: Secondary | ICD-10-CM | POA: Diagnosis not present

## 2019-08-19 ENCOUNTER — Other Ambulatory Visit: Payer: Self-pay

## 2019-08-19 ENCOUNTER — Other Ambulatory Visit: Payer: Self-pay | Admitting: Family Medicine

## 2019-08-19 MED ORDER — SIMVASTATIN 40 MG PO TABS: 40.0000 mg | ORAL_TABLET | Freq: Every day | ORAL | 0 refills | Status: DC

## 2019-08-19 MED ORDER — LISINOPRIL 20 MG PO TABS: 20.0000 mg | ORAL_TABLET | Freq: Every day | ORAL | 0 refills | Status: DC

## 2019-08-26 ENCOUNTER — Ambulatory Visit (INDEPENDENT_AMBULATORY_CARE_PROVIDER_SITE_OTHER): Payer: Medicare PPO | Admitting: Family Medicine

## 2019-08-26 ENCOUNTER — Other Ambulatory Visit: Payer: Self-pay

## 2019-08-26 VITALS — BP 120/58 | HR 68 | Temp 97.9°F | Resp 18 | Ht 72.0 in | Wt 195.0 lb

## 2019-08-26 DIAGNOSIS — E118 Type 2 diabetes mellitus with unspecified complications: Secondary | ICD-10-CM | POA: Diagnosis not present

## 2019-08-26 DIAGNOSIS — I10 Essential (primary) hypertension: Secondary | ICD-10-CM
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
## 2019-08-27 LAB — COMPLETE METABOLIC PANEL WITH GFR
AG Ratio: 1.5 (calc) (ref 1.0–2.5)
ALT: 26 U/L (ref 9–46)
AST: 24 U/L (ref 10–35)
Albumin: 4.3 g/dL (ref 3.6–5.1)
Alkaline phosphatase (APISO): 40 U/L (ref 35–144)
BUN/Creatinine Ratio: 18 (calc) (ref 6–22)
BUN: 21 mg/dL (ref 7–25)
CO2: 29 mmol/L (ref 20–32)
Calcium: 9.7 mg/dL (ref 8.6–10.3)
Chloride: 102 mmol/L (ref 98–110)
Creat: 1.14 mg/dL — ABNORMAL HIGH (ref 0.70–1.11)
GFR, Est African American: 70 mL/min/{1.73_m2} (ref >=60)
GFR, Est Non African American: 60 mL/min/{1.73_m2} (ref >=60)
Globulin: 2.8 g/dL (calc) (ref 1.9–3.7)
Glucose, Bld: 124 mg/dL — ABNORMAL HIGH (ref 65–99)
Potassium: 4.6 mmol/L (ref 3.5–5.3)
Sodium: 139 mmol/L (ref 135–146)
Total Bilirubin: 0.4 mg/dL (ref 0.2–1.2)
Total Protein: 7.1 g/dL (ref 6.1–8.1)

## 2019-08-27 LAB — MICROALBUMIN, URINE: Microalb, Ur: 7.3 mg/dL

## 2019-08-27 LAB — HEMOGLOBIN A1C
Hgb A1c MFr Bld: 7.7 % of total Hgb — ABNORMAL HIGH (ref <5.7)
Mean Plasma Glucose: 174 (calc)
eAG (mmol/L): 9.7 (calc)

## 2019-08-27 LAB — LIPID PANEL
Cholesterol: 134 mg/dL (ref <200)
HDL: 44 mg/dL (ref >=40)
LDL Cholesterol (Calc): 74 mg/dL (calc)
Non-HDL Cholesterol (Calc): 90 mg/dL (calc) (ref <130)
Total CHOL/HDL Ratio: 3 (calc) (ref <5.0)
Triglycerides: 78 mg/dL (ref <150)

## 2019-08-28 ENCOUNTER — Other Ambulatory Visit: Payer: Self-pay

## 2019-08-28 MED ORDER — METFORMIN HCL 1000 MG PO TABS: 1000.0000 mg | ORAL_TABLET | Freq: Two times a day (BID) | ORAL | 2 refills | Status: DC

## 2019-08-28 MED ORDER — SITAGLIPTIN PHOSPHATE 50 MG PO TABS: 50.0000 mg | ORAL_TABLET | Freq: Every day | ORAL | 2 refills | Status: DC

## 2019-08-29 LAB — CUP PACEART REMOTE DEVICE CHECK
Battery Remaining Longevity: 126 mo
Battery Remaining Percentage: 95.5 %
Battery Voltage: 2.99 V
Brady Statistic AP VP Percent: 1 %
Brady Statistic AP VS Percent: 7.4 %
Brady Statistic AS VP Percent: 1 %
Brady Statistic AS VS Percent: 93 %
Brady Statistic RA Percent Paced: 7.2 %
Brady Statistic RV Percent Paced: 1 %
Date Time Interrogation Session: 20210104020015
Implantable Lead Implant Date: 20150501
Implantable Lead Implant Date: 20150501
Implantable Lead Location: 753859
Implantable Lead Location: 753860
Implantable Lead Model: 1948
Implantable Pulse Generator Implant Date: 20150501
Lead Channel Impedance Value: 460 Ohm
Lead Channel Impedance Value: 660 Ohm
Lead Channel Pacing Threshold Amplitude: 0.75 V
Lead Channel Pacing Threshold Amplitude: 0.75 V
Lead Channel Pacing Threshold Pulse Width: 0.5 ms
Lead Channel Pacing Threshold Pulse Width: 0.5 ms
Lead Channel Sensing Intrinsic Amplitude: 12 mV
Lead Channel Sensing Intrinsic Amplitude: 5 mV
Lead Channel Setting Pacing Amplitude: 2 V
Lead Channel Setting Pacing Amplitude: 2.5 V
Lead Channel Setting Pacing Pulse Width: 0.5 ms
Lead Channel Setting Sensing Sensitivity: 2 mV
Pulse Gen Model: 2240
Pulse Gen Serial Number: 7618665

## 2019-08-30 NOTE — Progress Notes (Signed)
PPM remote 

## 2019-09-01 ENCOUNTER — Ambulatory Visit (INDEPENDENT_AMBULATORY_CARE_PROVIDER_SITE_OTHER): Payer: Medicare Other | Admitting: *Deleted

## 2019-09-01 DIAGNOSIS — I442 Atrioventricular block, complete: Secondary | ICD-10-CM | POA: Diagnosis not present

## 2019-09-30 ENCOUNTER — Other Ambulatory Visit: Payer: Self-pay | Admitting: Family Medicine

## 2019-09-30 MED ORDER — METFORMIN HCL 1000 MG PO TABS: 1000.0000 mg | ORAL_TABLET | Freq: Two times a day (BID) | ORAL | 2 refills | Status: DC

## 2019-10-09 ENCOUNTER — Other Ambulatory Visit: Payer: Self-pay

## 2019-10-09 ENCOUNTER — Ambulatory Visit (HOSPITAL_COMMUNITY)
Admission: RE | Admit: 2019-10-09 | Discharge: 2019-10-09 | Disposition: A | Discharge status: Home / Self Care | Payer: Medicare Other | Source: Ambulatory Visit | Attending: Pulmonary Disease | Admitting: Pulmonary Disease

## 2019-10-09 DIAGNOSIS — J849 Interstitial pulmonary disease, unspecified: Secondary | ICD-10-CM

## 2019-10-27 ENCOUNTER — Other Ambulatory Visit: Payer: Self-pay

## 2019-10-27 ENCOUNTER — Other Ambulatory Visit (HOSPITAL_COMMUNITY)
Admission: RE | Admit: 2019-10-27 | Discharge: 2019-10-27 | Disposition: A | Discharge status: Home / Self Care | Payer: Medicare Other | Source: Ambulatory Visit | Attending: Pulmonary Disease | Admitting: Pulmonary Disease

## 2019-10-27 DIAGNOSIS — Z01812 Encounter for preprocedural laboratory examination: Secondary | ICD-10-CM | POA: Diagnosis present

## 2019-10-27 DIAGNOSIS — Z20822 Contact with and (suspected) exposure to covid-19: Secondary | ICD-10-CM | POA: Diagnosis not present

## 2019-10-27 LAB — SARS CORONAVIRUS 2 (TAT 6-24 HRS): SARS Coronavirus 2: NEGATIVE

## 2019-10-29 ENCOUNTER — Other Ambulatory Visit: Payer: Self-pay

## 2019-10-29 ENCOUNTER — Ambulatory Visit (INDEPENDENT_AMBULATORY_CARE_PROVIDER_SITE_OTHER): Payer: Medicare Other | Admitting: Pulmonary Disease

## 2019-10-29 ENCOUNTER — Encounter: Payer: Self-pay | Admitting: Primary Care

## 2019-10-29 ENCOUNTER — Ambulatory Visit: Payer: Medicare Other | Admitting: Primary Care

## 2019-10-29 DIAGNOSIS — J849 Interstitial pulmonary disease, unspecified: Secondary | ICD-10-CM

## 2019-10-29 LAB — PULMONARY FUNCTION TEST
DL/VA % pred: 113 %
DL/VA: 4.36 ml/min/mmHg/L
DLCO cor % pred: 77 %
DLCO cor: 19.96 ml/min/mmHg
DLCO unc % pred: 77 %
DLCO unc: 19.96 ml/min/mmHg
FEF 25-75 Post: 4.23 L/sec
FEF 25-75 Pre: 3.34 L/sec
FEF2575-%Change-Post: 26 %
FEF2575-%Pred-Post: 195 %
FEF2575-%Pred-Pre: 154 %
FEV1-%Change-Post: 5 %
FEV1-%Pred-Post: 88 %
FEV1-%Pred-Pre: 83 %
FEV1-Post: 2.77 L
FEV1-Pre: 2.62 L
FEV1FVC-%Change-Post: 4 %
FEV1FVC-%Pred-Pre: 116 %
FEV6-%Change-Post: 1 %
FEV6-%Pred-Post: 77 %
FEV6-%Pred-Pre: 76 %
FEV6-Post: 3.18 L
FEV6-Pre: 3.14 L
FEV6FVC-%Pred-Post: 106 %
FEV6FVC-%Pred-Pre: 106 %
FVC-%Change-Post: 1 %
FVC-%Pred-Post: 72 %
FVC-%Pred-Pre: 71 %
FVC-Post: 3.18 L
FVC-Pre: 3.14 L
Post FEV1/FVC ratio: 87 %
Post FEV6/FVC ratio: 100 %
Pre FEV1/FVC ratio: 84 %
Pre FEV6/FVC Ratio: 100 %
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
## 2019-10-29 NOTE — Patient Instructions (Signed)
Testing: - HRCT showed degree of fibrosis unchanged from 2020 consistent with asbestosis, emphysema  - PFTs- stable, no change in lung function from 2020  Recommendations: - Continue to stay active, aim to get 20-30 min of physical exercise 2-3 times a week  - Monitor for increased shortness of breath, wheezing or cough  Orders: - PFTs with DLCO in March 2022   Follow-up: - 1 year with Dr. Elsworth Soho with PFTs or return sooner if you develop worsening respiratory symptoms

## 2019-10-29 NOTE — Progress Notes (Signed)
PFT done today. 

## 2019-11-03 NOTE — Assessment & Plan Note (Signed)
-   Stable, mostly asymptomatic - HRCT in February 2021 showed degree of fibrosis unchanged from 2020 consistent with asbestosis, emphysema  - PFTs today were stable, no change in lung function from 2020 - Recommend patient continue to stay active, aim to get 20-30 min of physical exercise 3 times a week  - Orders: PFTs with DLCO in March 2022  - Follow-up: 1 year with Dr. Elsworth Soho with PFTs or return sooner if you develop worsening respiratory symptoms

## 2019-11-06 ENCOUNTER — Telehealth: Payer: Self-pay | Admitting: Family Medicine

## 2019-11-06 NOTE — Telephone Encounter (Signed)
Patients wife left vm requesting a chest xray for patient. She states it was for insurance purposes.   CB# [phone]

## 2019-11-07 ENCOUNTER — Other Ambulatory Visit: Payer: Self-pay | Admitting: Family Medicine

## 2019-11-07 DIAGNOSIS — J61 Pneumoconiosis due to asbestos and other mineral fibers: Secondary | ICD-10-CM

## 2019-11-07 NOTE — Telephone Encounter (Signed)
Spoke with patient's wife and informed her of that Xray orders have been placed. Patient's wife verbalized understanding

## 2019-11-07 NOTE — Telephone Encounter (Signed)
Spoke with patient's wife to get more information. Patient's wife stated that patient has applied for compensation from insurance company for his exposure to asbestos while in the [employer] that caused his fibrosis. Ok to place order?

## 2019-11-07 NOTE — Telephone Encounter (Signed)
sure

## 2019-11-10 ENCOUNTER — Other Ambulatory Visit: Payer: Self-pay

## 2019-11-10 ENCOUNTER — Ambulatory Visit (HOSPITAL_COMMUNITY)
Admission: RE | Admit: 2019-11-10 | Discharge: 2019-11-10 | Disposition: A | Discharge status: Home / Self Care | Payer: Medicare Other | Source: Ambulatory Visit | Attending: Family Medicine | Admitting: Family Medicine

## 2019-11-10 DIAGNOSIS — J61 Pneumoconiosis due to asbestos and other mineral fibers: Secondary | ICD-10-CM | POA: Insufficient documentation

## 2019-11-26 ENCOUNTER — Other Ambulatory Visit: Payer: Self-pay | Admitting: Family Medicine

## 2019-11-26 ENCOUNTER — Other Ambulatory Visit: Payer: Medicare PPO

## 2019-11-26 DIAGNOSIS — I10 Essential (primary) hypertension: Secondary | ICD-10-CM

## 2019-11-26 DIAGNOSIS — E118 Type 2 diabetes mellitus with unspecified complications: Secondary | ICD-10-CM

## 2019-12-01 ENCOUNTER — Ambulatory Visit (INDEPENDENT_AMBULATORY_CARE_PROVIDER_SITE_OTHER): Payer: Medicare Other | Admitting: *Deleted

## 2019-12-01 DIAGNOSIS — I442 Atrioventricular block, complete: Secondary | ICD-10-CM

## 2019-12-01 LAB — CUP PACEART REMOTE DEVICE CHECK
Battery Remaining Longevity: 126 mo
Battery Remaining Percentage: 95.5 %
Battery Voltage: 2.99 V
Brady Statistic AP VP Percent: 1 %
Brady Statistic AP VS Percent: 6.7 %
Brady Statistic AS VP Percent: 1 %
Brady Statistic AS VS Percent: 93 %
Brady Statistic RA Percent Paced: 6.5 %
Brady Statistic RV Percent Paced: 1 %
Date Time Interrogation Session: 20210405020014
Implantable Lead Implant Date: 20150501
Implantable Lead Implant Date: 20150501
Implantable Lead Location: 753859
Implantable Lead Location: 753860
Implantable Lead Model: 1948
Implantable Pulse Generator Implant Date: 20150501
Lead Channel Impedance Value: 490 Ohm
Lead Channel Impedance Value: 630 Ohm
Lead Channel Pacing Threshold Amplitude: 0.75 V
Lead Channel Pacing Threshold Amplitude: 0.75 V
Lead Channel Pacing Threshold Pulse Width: 0.5 ms
Lead Channel Pacing Threshold Pulse Width: 0.5 ms
Lead Channel Sensing Intrinsic Amplitude: 12 mV
Lead Channel Sensing Intrinsic Amplitude: 5 mV
Lead Channel Setting Pacing Amplitude: 2 V
Lead Channel Setting Pacing Amplitude: 2.5 V
Lead Channel Setting Pacing Pulse Width: 0.5 ms
Lead Channel Setting Sensing Sensitivity: 2 mV
Pulse Gen Model: 2240
Pulse Gen Serial Number: 7618665

## 2019-12-02 NOTE — Progress Notes (Signed)
PPM Remote  

## 2019-12-04 ENCOUNTER — Other Ambulatory Visit: Payer: Self-pay

## 2019-12-04 ENCOUNTER — Other Ambulatory Visit: Payer: Medicare Other

## 2019-12-04 DIAGNOSIS — E118 Type 2 diabetes mellitus with unspecified complications: Secondary | ICD-10-CM

## 2019-12-04 DIAGNOSIS — I10 Essential (primary) hypertension: Secondary | ICD-10-CM

## 2019-12-05 ENCOUNTER — Other Ambulatory Visit: Payer: Medicare Other

## 2019-12-05 DIAGNOSIS — E875 Hyperkalemia: Secondary | ICD-10-CM

## 2019-12-05 LAB — CBC WITH DIFFERENTIAL/PLATELET
Absolute Monocytes: 378 cells/uL (ref 200–950)
Basophils Absolute: 48 cells/uL (ref 0–200)
Basophils Relative: 0.8 %
Eosinophils Absolute: 132 cells/uL (ref 15–500)
Eosinophils Relative: 2.2 %
HCT: 39 % (ref 38.5–50.0)
Hemoglobin: 12.9 g/dL — ABNORMAL LOW (ref 13.2–17.1)
Lymphs Abs: 2004 cells/uL (ref 850–3900)
MCH: 29.1 pg (ref 27.0–33.0)
MCHC: 33.1 g/dL (ref 32.0–36.0)
MCV: 88 fL (ref 80.0–100.0)
MPV: 12.8 fL — ABNORMAL HIGH (ref 7.5–12.5)
Monocytes Relative: 6.3 %
Neutro Abs: 3438 cells/uL (ref 1500–7800)
Neutrophils Relative %: 57.3 %
Platelets: 216 10*3/uL (ref 140–400)
RBC: 4.43 10*6/uL (ref 4.20–5.80)
RDW: 12.3 % (ref 11.0–15.0)
Total Lymphocyte: 33.4 %
WBC: 6 10*3/uL (ref 3.8–10.8)

## 2019-12-05 LAB — COMPREHENSIVE METABOLIC PANEL
AG Ratio: 1.6 (calc) (ref 1.0–2.5)
ALT: 15 U/L (ref 9–46)
AST: 21 U/L (ref 10–35)
Albumin: 4.5 g/dL (ref 3.6–5.1)
Alkaline phosphatase (APISO): 40 U/L (ref 35–144)
BUN/Creatinine Ratio: 20 (calc) (ref 6–22)
BUN: 28 mg/dL — ABNORMAL HIGH (ref 7–25)
CO2: 31 mmol/L (ref 20–32)
Calcium: 10.2 mg/dL (ref 8.6–10.3)
Chloride: 101 mmol/L (ref 98–110)
Creat: 1.37 mg/dL — ABNORMAL HIGH (ref 0.70–1.11)
Globulin: 2.9 g/dL (calc) (ref 1.9–3.7)
Glucose, Bld: 123 mg/dL — ABNORMAL HIGH (ref 65–99)
Potassium: 6.3 mmol/L (ref 3.5–5.3)
Sodium: 137 mmol/L (ref 135–146)
Total Bilirubin: 0.6 mg/dL (ref 0.2–1.2)
Total Protein: 7.4 g/dL (ref 6.1–8.1)

## 2019-12-05 LAB — HEMOGLOBIN A1C
Hgb A1c MFr Bld: 6.4 % of total Hgb — ABNORMAL HIGH (ref <5.7)
Mean Plasma Glucose: 137 (calc)
eAG (mmol/L): 7.6 (calc)

## 2019-12-05 LAB — LIPID PANEL
Cholesterol: 148 mg/dL (ref <200)
HDL: 43 mg/dL (ref >=40)
LDL Cholesterol (Calc): 90 mg/dL (calc)
Non-HDL Cholesterol (Calc): 105 mg/dL (calc) (ref <130)
Total CHOL/HDL Ratio: 3.4 (calc) (ref <5.0)
Triglycerides: 60 mg/dL (ref <150)

## 2019-12-06 ENCOUNTER — Other Ambulatory Visit: Payer: Self-pay | Admitting: Family Medicine

## 2019-12-06 LAB — BASIC METABOLIC PANEL
BUN/Creatinine Ratio: 23 (calc) — ABNORMAL HIGH (ref 6–22)
BUN: 32 mg/dL — ABNORMAL HIGH (ref 7–25)
CO2: 26 mmol/L (ref 20–32)
Calcium: 9.4 mg/dL (ref 8.6–10.3)
Chloride: 103 mmol/L (ref 98–110)
Creat: 1.4 mg/dL — ABNORMAL HIGH (ref 0.70–1.11)
Glucose, Bld: 125 mg/dL — ABNORMAL HIGH (ref 65–99)
Potassium: 5.2 mmol/L (ref 3.5–5.3)
Sodium: 137 mmol/L (ref 135–146)

## 2019-12-25 ENCOUNTER — Telehealth: Payer: Self-pay | Admitting: Internal Medicine

## 2019-12-25 NOTE — Telephone Encounter (Signed)
  Patient Consent for Virtual Visit         Edgar Evans has provided verbal consent on 12/25/2019 for a virtual visit (video or telephone).   CONSENT FOR VIRTUAL VISIT FOR:  Edgar Evans  By participating in this virtual visit I agree to the following:  I hereby voluntarily request, consent and authorize [facility] and its employed or contracted physicians, physician assistants, nurse practitioners or other licensed health care professionals (the Practitioner), to provide me with telemedicine health care services (the "Services") as deemed necessary by the treating Practitioner. I acknowledge and consent to receive the Services by the Practitioner via telemedicine. I understand that the telemedicine visit will involve communicating with the Practitioner through live audiovisual communication technology and the disclosure of certain medical information by electronic transmission. I acknowledge that I have been given the opportunity to request an in-person assessment or other available alternative prior to the telemedicine visit and am voluntarily participating in the telemedicine visit.  I understand that I have the right to withhold or withdraw my consent to the use of telemedicine in the course of my care at any time, without affecting my right to future care or treatment, and that the Practitioner or I may terminate the telemedicine visit at any time. I understand that I have the right to inspect all information obtained and/or recorded in the course of the telemedicine visit and may receive copies of available information for a reasonable fee.  I understand that some of the potential risks of receiving the Services via telemedicine include:  Marland Kitchen Delay or interruption in medical evaluation due to technological equipment failure or disruption; . Information transmitted may not be sufficient (e.g. poor resolution of images) to allow for appropriate medical decision making by the Practitioner;  and/or  . In rare instances, security protocols could fail, causing a breach of personal health information.  Furthermore, I acknowledge that it is my responsibility to provide information about my medical history, conditions and care that is complete and accurate to the best of my ability. I acknowledge that Practitioner's advice, recommendations, and/or decision may be based on factors not within their control, such as incomplete or inaccurate data provided by me or distortions of diagnostic images or specimens that may result from electronic transmissions. I understand that the practice of medicine is not an exact science and that Practitioner makes no warranties or guarantees regarding treatment outcomes. I acknowledge that a copy of this consent can be made available to me via my patient portal ([facility]), or I can request a printed copy by calling the office of [facility].    I understand that my insurance will be billed for this visit.   I have read or had this consent read to me. . I understand the contents of this consent, which adequately explains the benefits and risks of the Services being provided via telemedicine.  . I have been provided ample opportunity to ask questions regarding this consent and the Services and have had my questions answered to my satisfaction. . I give my informed consent for the services to be provided through the use of telemedicine in my medical care

## 2020-01-02 ENCOUNTER — Telehealth (INDEPENDENT_AMBULATORY_CARE_PROVIDER_SITE_OTHER): Payer: Medicare Other | Admitting: Internal Medicine

## 2020-01-02 ENCOUNTER — Encounter: Payer: Self-pay | Admitting: Internal Medicine

## 2020-01-02 VITALS — BP 111/57 | HR 63 | Ht 72.0 in | Wt 190.0 lb

## 2020-01-02 DIAGNOSIS — I119 Hypertensive heart disease without heart failure: Secondary | ICD-10-CM | POA: Diagnosis not present

## 2020-01-02 DIAGNOSIS — I441 Atrioventricular block, second degree: Secondary | ICD-10-CM | POA: Diagnosis not present

## 2020-01-02 DIAGNOSIS — E782 Mixed hyperlipidemia: Secondary | ICD-10-CM | POA: Diagnosis not present

## 2020-01-02 MED ORDER — LISINOPRIL 10 MG PO TABS: 10.0000 mg | ORAL_TABLET | Freq: Every day | ORAL | 3 refills | Status: AC
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
## 2020-01-02 NOTE — Addendum Note (Signed)
Addended by: Laurine Blazer on: 01/02/2020 10:18 AM   Modules accepted: Orders

## 2020-01-02 NOTE — Patient Instructions (Signed)
Medication Instructions:   Decrease Lisinopril to 10mg  daily.  New prescription sent to pharmacy today.   Continue all other medications.    Labwork: none  Testing/Procedures: none  Follow-Up: Your physician wants you to follow up in:  1 year.  You will receive a reminder letter in the mail one-two months in advance.  If you don't receive a letter, please call our office to schedule the follow up appointment   Any Other Special Instructions Will Be Listed Below (If Applicable).  If you need a refill on your cardiac medications before your next appointment, please call your pharmacy.

## 2020-02-20 ENCOUNTER — Other Ambulatory Visit: Payer: Self-pay | Admitting: Family Medicine

## 2020-02-22 ENCOUNTER — Other Ambulatory Visit: Payer: Self-pay | Admitting: Family Medicine

## 2020-02-23 ENCOUNTER — Other Ambulatory Visit: Payer: Self-pay

## 2020-02-23 MED ORDER — METFORMIN HCL 1000 MG PO TABS: 1000.0000 mg | ORAL_TABLET | Freq: Once | ORAL | 0 refills | Status: DC

## 2020-03-03 ENCOUNTER — Ambulatory Visit (INDEPENDENT_AMBULATORY_CARE_PROVIDER_SITE_OTHER): Payer: Medicare Other | Admitting: *Deleted

## 2020-03-03 DIAGNOSIS — I442 Atrioventricular block, complete: Secondary | ICD-10-CM | POA: Diagnosis not present

## 2020-03-03 LAB — CUP PACEART REMOTE DEVICE CHECK
Battery Remaining Longevity: 127 mo
Battery Remaining Percentage: 95.5 %
Battery Voltage: 2.99 V
Brady Statistic AP VP Percent: 1 %
Brady Statistic AP VS Percent: 6.1 %
Brady Statistic AS VP Percent: 1 %
Brady Statistic AS VS Percent: 94 %
Brady Statistic RA Percent Paced: 6 %
Brady Statistic RV Percent Paced: 1 %
Date Time Interrogation Session: 20210707020013
Implantable Lead Implant Date: 20150501
Implantable Lead Implant Date: 20150501
Implantable Lead Location: 753859
Implantable Lead Location: 753860
Implantable Lead Model: 1948
Implantable Pulse Generator Implant Date: 20150501
Lead Channel Impedance Value: 460 Ohm
Lead Channel Impedance Value: 710 Ohm
Lead Channel Pacing Threshold Amplitude: 0.75 V
Lead Channel Pacing Threshold Amplitude: 0.75 V
Lead Channel Pacing Threshold Pulse Width: 0.5 ms
Lead Channel Pacing Threshold Pulse Width: 0.5 ms
Lead Channel Sensing Intrinsic Amplitude: 12 mV
Lead Channel Sensing Intrinsic Amplitude: 5 mV
Lead Channel Setting Pacing Amplitude: 2 V
Lead Channel Setting Pacing Amplitude: 2.5 V
Lead Channel Setting Pacing Pulse Width: 0.5 ms
Lead Channel Setting Sensing Sensitivity: 2 mV
Pulse Gen Model: 2240
Pulse Gen Serial Number: 7618665

## 2020-03-04 NOTE — Progress Notes (Signed)
Remote pacemaker transmission.   

## 2020-03-08 ENCOUNTER — Other Ambulatory Visit: Payer: Medicare Other

## 2020-03-08 ENCOUNTER — Other Ambulatory Visit: Payer: Self-pay

## 2020-03-08 DIAGNOSIS — E118 Type 2 diabetes mellitus with unspecified complications: Secondary | ICD-10-CM

## 2020-03-09 LAB — BASIC METABOLIC PANEL
BUN/Creatinine Ratio: 15 (calc) (ref 6–22)
BUN: 20 mg/dL (ref 7–25)
CO2: 28 mmol/L (ref 20–32)
Calcium: 9.5 mg/dL (ref 8.6–10.3)
Chloride: 102 mmol/L (ref 98–110)
Creat: 1.31 mg/dL — ABNORMAL HIGH (ref 0.70–1.11)
Glucose, Bld: 111 mg/dL — ABNORMAL HIGH (ref 65–99)
Potassium: 4.8 mmol/L (ref 3.5–5.3)
Sodium: 139 mmol/L (ref 135–146)

## 2020-03-09 LAB — HEMOGLOBIN A1C
Hgb A1c MFr Bld: 6.4 % of total Hgb — ABNORMAL HIGH (ref <5.7)
Mean Plasma Glucose: 137 (calc)
eAG (mmol/L): 7.6 (calc)

## 2020-03-10 ENCOUNTER — Other Ambulatory Visit: Payer: Self-pay | Admitting: Family Medicine

## 2020-05-31 ENCOUNTER — Ambulatory Visit (INDEPENDENT_AMBULATORY_CARE_PROVIDER_SITE_OTHER): Payer: Medicare Other

## 2020-05-31 DIAGNOSIS — I442 Atrioventricular block, complete: Secondary | ICD-10-CM | POA: Diagnosis not present

## 2020-06-02 LAB — CUP PACEART REMOTE DEVICE CHECK
Battery Remaining Longevity: 125 mo
Battery Remaining Percentage: 95.5 %
Battery Voltage: 2.99 V
Brady Statistic AP VP Percent: 1 %
Brady Statistic AP VS Percent: 5.9 %
Brady Statistic AS VP Percent: 1 %
Brady Statistic AS VS Percent: 94 %
Brady Statistic RA Percent Paced: 5.7 %
Brady Statistic RV Percent Paced: 1 %
Date Time Interrogation Session: 20211005112750
Implantable Lead Implant Date: 20150501
Implantable Lead Implant Date: 20150501
Implantable Lead Location: 753859
Implantable Lead Location: 753860
Implantable Lead Model: 1948
Implantable Pulse Generator Implant Date: 20150501
Lead Channel Impedance Value: 450 Ohm
Lead Channel Impedance Value: 630 Ohm
Lead Channel Pacing Threshold Amplitude: 0.75 V
Lead Channel Pacing Threshold Amplitude: 0.75 V
Lead Channel Pacing Threshold Pulse Width: 0.5 ms
Lead Channel Pacing Threshold Pulse Width: 0.5 ms
Lead Channel Sensing Intrinsic Amplitude: 12 mV
Lead Channel Sensing Intrinsic Amplitude: 5 mV
Lead Channel Setting Pacing Amplitude: 2 V
Lead Channel Setting Pacing Amplitude: 2.5 V
Lead Channel Setting Pacing Pulse Width: 0.5 ms
Lead Channel Setting Sensing Sensitivity: 2 mV
Pulse Gen Model: 2240
Pulse Gen Serial Number: 7618665

## 2020-06-02 NOTE — Progress Notes (Signed)
Remote pacemaker transmission.   

## 2020-06-15 ENCOUNTER — Other Ambulatory Visit: Payer: Self-pay | Admitting: Family Medicine

## 2020-06-22 ENCOUNTER — Telehealth: Payer: Self-pay | Admitting: Internal Medicine

## 2020-06-22 ENCOUNTER — Telehealth: Payer: Self-pay | Admitting: Family Medicine

## 2020-06-22 NOTE — Telephone Encounter (Signed)
Call for antibody until he get a dentist appt  [phone]

## 2020-06-22 NOTE — Telephone Encounter (Signed)
Patient's wife is calling to make Dr. Rayann Heman aware that the patient may need work done on a split tooth. She would like to know if the patient needs to take an antibiotic until he is able to have dental work. Please advise.

## 2020-06-23 ENCOUNTER — Telehealth: Payer: Self-pay

## 2020-06-23 NOTE — Telephone Encounter (Signed)
Antibiotic

## 2020-06-23 NOTE — Telephone Encounter (Signed)
Spoke to [facility] and the pharmacy had not receive any medications yet from the PCP. Will wait to find out the out come from PCP.

## 2020-06-23 NOTE — Telephone Encounter (Signed)
Called and spoke to Edgar Evans. I advised her that based on my recorders that Dr. Dennard Schaumann offices had called something in to the pharmacy for Edgar Evans yesterday evening. When they open this morning she will call a confirm that the pharmacy has a medication. I'll reach back out to her and see if there is anything else we can help with.   She verbalized understanding

## 2020-06-24 ENCOUNTER — Other Ambulatory Visit: Payer: Self-pay | Admitting: Family Medicine

## 2020-06-24 MED ORDER — CLINDAMYCIN HCL 300 MG PO CAPS: 300.0000 mg | ORAL_CAPSULE | Freq: Three times a day (TID) | ORAL | 0 refills | Status: DC

## 2020-06-24 NOTE — Telephone Encounter (Signed)
PCP Dr. Samella Parr office called in Clindamycin 300 MG for the patient.

## 2020-06-25 ENCOUNTER — Other Ambulatory Visit: Payer: Self-pay | Admitting: Family Medicine

## 2020-06-25 MED ORDER — METFORMIN HCL 1000 MG PO TABS: 1000.0000 mg | ORAL_TABLET | Freq: Two times a day (BID) | ORAL | 11 refills | Status: DC

## 2020-06-25 NOTE — Telephone Encounter (Signed)
Pt called for refill.

## 2020-09-01 ENCOUNTER — Ambulatory Visit (INDEPENDENT_AMBULATORY_CARE_PROVIDER_SITE_OTHER): Payer: Medicare Other

## 2020-09-01 DIAGNOSIS — I442 Atrioventricular block, complete: Secondary | ICD-10-CM | POA: Diagnosis not present

## 2020-09-01 LAB — CUP PACEART REMOTE DEVICE CHECK
Battery Remaining Longevity: 125 mo
Battery Remaining Percentage: 95.5 %
Battery Voltage: 2.99 V
Brady Statistic AP VP Percent: 1 %
Brady Statistic AP VS Percent: 5.6 %
Brady Statistic AS VP Percent: 1 %
Brady Statistic AS VS Percent: 94 %
Brady Statistic RA Percent Paced: 5.4 %
Brady Statistic RV Percent Paced: 1 %
Date Time Interrogation Session: 20220105020013
Implantable Lead Implant Date: 20150501
Implantable Lead Implant Date: 20150501
Implantable Lead Location: 753859
Implantable Lead Location: 753860
Implantable Lead Model: 1948
Implantable Pulse Generator Implant Date: 20150501
Lead Channel Impedance Value: 450 Ohm
Lead Channel Impedance Value: 610 Ohm
Lead Channel Pacing Threshold Amplitude: 0.75 V
Lead Channel Pacing Threshold Amplitude: 0.75 V
Lead Channel Pacing Threshold Pulse Width: 0.5 ms
Lead Channel Pacing Threshold Pulse Width: 0.5 ms
Lead Channel Sensing Intrinsic Amplitude: 12 mV
Lead Channel Sensing Intrinsic Amplitude: 5 mV
Lead Channel Setting Pacing Amplitude: 2 V
Lead Channel Setting Pacing Amplitude: 2.5 V
Lead Channel Setting Pacing Pulse Width: 0.5 ms
Lead Channel Setting Sensing Sensitivity: 2 mV
Pulse Gen Model: 2240
Pulse Gen Serial Number: 7618665

## 2020-09-14 ENCOUNTER — Other Ambulatory Visit: Payer: Self-pay | Admitting: Family Medicine

## 2020-09-15 NOTE — Progress Notes (Signed)
Remote pacemaker transmission.   

## 2020-11-03 ENCOUNTER — Other Ambulatory Visit: Payer: Self-pay | Admitting: Pulmonary Disease

## 2020-11-03 DIAGNOSIS — J849 Interstitial pulmonary disease, unspecified: Secondary | ICD-10-CM

## 2020-11-04 ENCOUNTER — Ambulatory Visit: Payer: Medicare Other | Admitting: Pulmonary Disease

## 2020-11-04 ENCOUNTER — Other Ambulatory Visit: Payer: Self-pay

## 2020-11-04 ENCOUNTER — Encounter: Payer: Self-pay | Admitting: Pulmonary Disease

## 2020-11-04 ENCOUNTER — Ambulatory Visit (INDEPENDENT_AMBULATORY_CARE_PROVIDER_SITE_OTHER): Payer: Medicare Other | Admitting: Pulmonary Disease

## 2020-11-04 DIAGNOSIS — J849 Interstitial pulmonary disease, unspecified: Secondary | ICD-10-CM

## 2020-11-04 DIAGNOSIS — J61 Pneumoconiosis due to asbestos and other mineral fibers: Secondary | ICD-10-CM | POA: Diagnosis not present

## 2020-11-04 LAB — PULMONARY FUNCTION TEST
DL/VA % pred: 115 %
DL/VA: 4.45 ml/min/mmHg/L
DLCO cor % pred: 83 %
DLCO cor: 21.46 ml/min/mmHg
DLCO unc % pred: 83 %
DLCO unc: 21.46 ml/min/mmHg
FEF 25-75 Post: 4.41 L/sec
FEF 25-75 Pre: 3.29 L/sec
FEF2575-%Change-Post: 33 %
FEF2575-%Pred-Post: 208 %
FEF2575-%Pred-Pre: 155 %
FEV1-%Change-Post: 5 %
FEV1-%Pred-Post: 87 %
FEV1-%Pred-Pre: 83 %
FEV1-Post: 2.71 L
FEV1-Pre: 2.57 L
FEV1FVC-%Change-Post: 1 %
FEV1FVC-%Pred-Pre: 119 %
FEV6-%Change-Post: 4 %
FEV6-%Pred-Post: 77 %
FEV6-%Pred-Pre: 74 %
FEV6-Post: 3.14 L
FEV6-Pre: 3 L
FEV6FVC-%Pred-Post: 106 %
FEV6FVC-%Pred-Pre: 106 %
FVC-%Change-Post: 4 %
FVC-%Pred-Post: 72 %
FVC-%Pred-Pre: 69 %
FVC-Post: 3.14 L
FVC-Pre: 3 L
Post FEV1/FVC ratio: 86 %
Post FEV6/FVC ratio: 100 %
Pre FEV1/FVC ratio: 86 %
Pre FEV6/FVC Ratio: 100 %
RV % pred: 60 %
RV: 1.67 L
TLC % pred: 64 %
TLC: 4.83 L
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
## 2020-11-04 NOTE — Patient Instructions (Signed)
  Schedule high-resolution CT scan of the chest  Lung function appears stable You have effect of asbestos on your lungs

## 2020-11-04 NOTE — Assessment & Plan Note (Signed)
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 We will obtain another HRCT to review

## 2020-11-04 NOTE — Addendum Note (Signed)
Addended by: Merrilee Seashore on: 11/04/2020 03:33 PM   Modules accepted: Orders

## 2020-11-04 NOTE — Assessment & Plan Note (Addendum)
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 Lung function appears stable and he is symptomatically stable so do not feel the need to start antifibrotic's at this time We will obtain another HRCT for disease progression  Future follow-up can be in [place]

## 2020-11-04 NOTE — Progress Notes (Signed)
PFT done today. 

## 2020-11-29 ENCOUNTER — Ambulatory Visit (HOSPITAL_COMMUNITY): Payer: Medicare PPO

## 2020-12-01 ENCOUNTER — Ambulatory Visit (INDEPENDENT_AMBULATORY_CARE_PROVIDER_SITE_OTHER): Payer: Medicare PPO

## 2020-12-01 DIAGNOSIS — I442 Atrioventricular block, complete: Secondary | ICD-10-CM

## 2020-12-04 LAB — CUP PACEART REMOTE DEVICE CHECK
Battery Remaining Longevity: 125 mo
Battery Remaining Percentage: 95.5 %
Battery Voltage: 2.98 V
Brady Statistic AP VP Percent: 1 %
Brady Statistic AP VS Percent: 5.2 %
Brady Statistic AS VP Percent: 1 %
Brady Statistic AS VS Percent: 95 %
Brady Statistic RA Percent Paced: 5.1 %
Brady Statistic RV Percent Paced: 1 %
Date Time Interrogation Session: 20220409101254
Implantable Lead Implant Date: 20150501
Implantable Lead Implant Date: 20150501
Implantable Lead Location: 753859
Implantable Lead Location: 753860
Implantable Lead Model: 1948
Implantable Pulse Generator Implant Date: 20150501
Lead Channel Impedance Value: 440 Ohm
Lead Channel Impedance Value: 630 Ohm
Lead Channel Pacing Threshold Amplitude: 0.75 V
Lead Channel Pacing Threshold Amplitude: 0.75 V
Lead Channel Pacing Threshold Pulse Width: 0.5 ms
Lead Channel Pacing Threshold Pulse Width: 0.5 ms
Lead Channel Sensing Intrinsic Amplitude: 12 mV
Lead Channel Sensing Intrinsic Amplitude: 5 mV
Lead Channel Setting Pacing Amplitude: 2 V
Lead Channel Setting Pacing Amplitude: 2.5 V
Lead Channel Setting Pacing Pulse Width: 0.5 ms
Lead Channel Setting Sensing Sensitivity: 2 mV
Pulse Gen Model: 2240
Pulse Gen Serial Number: 7618665

## 2020-12-08 ENCOUNTER — Ambulatory Visit (HOSPITAL_COMMUNITY)
Admission: RE | Admit: 2020-12-08 | Discharge: 2020-12-08 | Disposition: A | Discharge status: Home / Self Care | Payer: Medicare PPO | Source: Ambulatory Visit | Attending: Pulmonary Disease | Admitting: Pulmonary Disease

## 2020-12-08 ENCOUNTER — Other Ambulatory Visit: Payer: Self-pay

## 2020-12-08 DIAGNOSIS — J929 Pleural plaque without asbestos: Secondary | ICD-10-CM | POA: Diagnosis not present

## 2020-12-08 DIAGNOSIS — J849 Interstitial pulmonary disease, unspecified: Secondary | ICD-10-CM | POA: Diagnosis not present

## 2020-12-08 DIAGNOSIS — J61 Pneumoconiosis due to asbestos and other mineral fibers: Secondary | ICD-10-CM | POA: Insufficient documentation

## 2020-12-08 DIAGNOSIS — J439 Emphysema, unspecified: Secondary | ICD-10-CM | POA: Diagnosis not present

## 2020-12-08 DIAGNOSIS — Z77090 Contact with and (suspected) exposure to asbestos: Secondary | ICD-10-CM | POA: Diagnosis not present

## 2020-12-08 DIAGNOSIS — I2510 Atherosclerotic heart disease of native coronary artery without angina pectoris: Secondary | ICD-10-CM | POA: Diagnosis not present

## 2020-12-13 NOTE — Progress Notes (Signed)
Remote pacemaker transmission.   

## 2020-12-27 ENCOUNTER — Other Ambulatory Visit: Payer: Self-pay | Admitting: Family Medicine

## 2020-12-31 ENCOUNTER — Encounter: Payer: Self-pay | Admitting: Internal Medicine

## 2020-12-31 ENCOUNTER — Ambulatory Visit: Payer: Medicare Other | Admitting: Internal Medicine

## 2020-12-31 ENCOUNTER — Other Ambulatory Visit: Payer: Self-pay

## 2020-12-31 VITALS — BP 120/70 | HR 62 | Ht 72.0 in | Wt 187.0 lb

## 2020-12-31 DIAGNOSIS — I441 Atrioventricular block, second degree: Secondary | ICD-10-CM | POA: Diagnosis not present

## 2020-12-31 DIAGNOSIS — I10 Essential (primary) hypertension: Secondary | ICD-10-CM

## 2020-12-31 DIAGNOSIS — E782 Mixed hyperlipidemia: Secondary | ICD-10-CM | POA: Diagnosis not present

## 2020-12-31 LAB — CUP PACEART INCLINIC DEVICE CHECK
Battery Remaining Longevity: 141 mo
Battery Voltage: 2.98 V
Brady Statistic RA Percent Paced: 5 %
Brady Statistic RV Percent Paced: 0 %
Date Time Interrogation Session: 20220506145651
Implantable Lead Implant Date: 20150501
Implantable Lead Implant Date: 20150501
Implantable Lead Location: 753859
Implantable Lead Location: 753860
Implantable Lead Model: 1948
Implantable Pulse Generator Implant Date: 20150501
Lead Channel Impedance Value: 425 Ohm
Lead Channel Impedance Value: 612.5 Ohm
Lead Channel Pacing Threshold Amplitude: 0.5 V
Lead Channel Pacing Threshold Amplitude: 0.5 V
Lead Channel Pacing Threshold Pulse Width: 0.5 ms
Lead Channel Pacing Threshold Pulse Width: 0.5 ms
Lead Channel Sensing Intrinsic Amplitude: 12 mV
Lead Channel Sensing Intrinsic Amplitude: 5 mV
Lead Channel Setting Pacing Amplitude: 0.75 V
Lead Channel Setting Pacing Amplitude: 1.5 V
Lead Channel Setting Pacing Pulse Width: 0.5 ms
Lead Channel Setting Sensing Sensitivity: 2 mV
Pulse Gen Model: 2240
Pulse Gen Serial Number: 7618665

## 2020-12-31 NOTE — Patient Instructions (Signed)
Medication Instructions:  Continue all current medications.  Labwork: none  Testing/Procedures: none  Follow-Up: 1 year - Dr.  Allred   Any Other Special Instructions Will Be Listed Below (If Applicable).   If you need a refill on your cardiac medications before your next appointment, please call your pharmacy.  
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
## 2020-12-31 NOTE — Addendum Note (Signed)
Addended by: Christella Scheuermann C on: 12/31/2020 03:27 PM   Modules accepted: Orders

## 2021-03-02 ENCOUNTER — Ambulatory Visit (INDEPENDENT_AMBULATORY_CARE_PROVIDER_SITE_OTHER): Payer: Medicare Other

## 2021-03-02 DIAGNOSIS — I442 Atrioventricular block, complete: Secondary | ICD-10-CM | POA: Diagnosis not present

## 2021-03-03 LAB — CUP PACEART REMOTE DEVICE CHECK
Battery Remaining Longevity: 58 mo
Battery Remaining Percentage: 44 %
Battery Voltage: 2.98 V
Brady Statistic AP VP Percent: 1 %
Brady Statistic AP VS Percent: 1.6 %
Brady Statistic AS VP Percent: 1 %
Brady Statistic AS VS Percent: 98 %
Brady Statistic RA Percent Paced: 1.6 %
Brady Statistic RV Percent Paced: 1 %
Date Time Interrogation Session: 20220706032308
Implantable Lead Implant Date: 20150501
Implantable Lead Implant Date: 20150501
Implantable Lead Location: 753859
Implantable Lead Location: 753860
Implantable Lead Model: 1948
Implantable Pulse Generator Implant Date: 20150501
Lead Channel Impedance Value: 400 Ohm
Lead Channel Impedance Value: 650 Ohm
Lead Channel Pacing Threshold Amplitude: 0.5 V
Lead Channel Pacing Threshold Amplitude: 0.5 V
Lead Channel Pacing Threshold Pulse Width: 0.5 ms
Lead Channel Pacing Threshold Pulse Width: 0.5 ms
Lead Channel Sensing Intrinsic Amplitude: 12 mV
Lead Channel Sensing Intrinsic Amplitude: 5 mV
Lead Channel Setting Pacing Amplitude: 0.75 V
Lead Channel Setting Pacing Amplitude: 1.5 V
Lead Channel Setting Pacing Pulse Width: 0.5 ms
Lead Channel Setting Sensing Sensitivity: 2 mV
Pulse Gen Model: 2240
Pulse Gen Serial Number: 7618665

## 2021-03-23 NOTE — Progress Notes (Signed)
Remote pacemaker transmission.   

## 2021-03-24 ENCOUNTER — Other Ambulatory Visit: Payer: Self-pay | Admitting: Family Medicine

## 2021-03-29 ENCOUNTER — Other Ambulatory Visit: Payer: Self-pay | Admitting: Family Medicine

## 2021-04-12 ENCOUNTER — Other Ambulatory Visit (HOSPITAL_COMMUNITY)
Admission: RE | Admit: 2021-04-12 | Discharge: 2021-04-12 | Disposition: A | Discharge status: Home / Self Care | Payer: Medicare Other | Source: Ambulatory Visit | Attending: Pulmonary Disease | Admitting: Pulmonary Disease

## 2021-04-12 ENCOUNTER — Ambulatory Visit: Payer: Medicare Other | Admitting: Pulmonary Disease

## 2021-04-12 ENCOUNTER — Other Ambulatory Visit: Payer: Self-pay

## 2021-04-12 ENCOUNTER — Encounter: Payer: Self-pay | Admitting: Pulmonary Disease

## 2021-04-12 VITALS — BP 122/70 | HR 69 | Temp 97.2°F

## 2021-04-12 DIAGNOSIS — J849 Interstitial pulmonary disease, unspecified: Secondary | ICD-10-CM

## 2021-04-12 LAB — COMPREHENSIVE METABOLIC PANEL
ALT: 20 U/L (ref 0–44)
AST: 22 U/L (ref 15–41)
Albumin: 4 g/dL (ref 3.5–5.0)
Alkaline Phosphatase: 37 U/L — ABNORMAL LOW (ref 38–126)
Anion gap: 4 — ABNORMAL LOW (ref 5–15)
BUN: 27 mg/dL — ABNORMAL HIGH (ref 8–23)
CO2: 28 mmol/L (ref 22–32)
Calcium: 9.3 mg/dL (ref 8.9–10.3)
Chloride: 104 mmol/L (ref 98–111)
Creatinine, Ser: 1.25 mg/dL — ABNORMAL HIGH (ref 0.61–1.24)
GFR, Estimated: 57 mL/min — ABNORMAL LOW (ref >60)
Glucose, Bld: 99 mg/dL (ref 70–99)
Potassium: 5.4 mmol/L — ABNORMAL HIGH (ref 3.5–5.1)
Sodium: 136 mmol/L (ref 135–145)
Total Bilirubin: 0.5 mg/dL (ref 0.3–1.2)
Total Protein: 7.2 g/dL (ref 6.5–8.1)

## 2021-04-12 NOTE — Patient Instructions (Signed)
Pulmonary fibrosis is getting worse Check CMET today Start medication OFEV - we discussed side effects of diarrhea & liver problems - check LFTs 1 month after starting

## 2021-04-12 NOTE — Progress Notes (Signed)
   Subjective:    Patient ID: Edgar Evans, male    DOB: Jun 20, 1941, 80 y.o.   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  Chief Complaint  Patient presents with   Follow-up    Follow up appt for ILD. Reports no new concerns.    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  patchy areas of peripheral predominant groundglass attenuation and septal thickening mostly in the mid to lower lungs with minimal bronchiectasis, no definite honeycombing hence called "indeterminate" for UIP.    ANA neg, CCP neg, ACE <1, ESR 13     PFTs 10/2020 stable, FVC 69%, TLC 64%, DLCO 83%     PFTs 10/29/2019 - FVC 3.18 (72%), FEV1 2.77 (88%), ratio 87, TLC 55%, DLCOcor 77%, 19.96   PFTs 09/2018 -FVC 67%, FEV1 80%, ratio 86 TLC 57% , DLCO 76%, 19.89   Review of Systems neg for any significant sore throat, dysphagia, itching, sneezing, nasal congestion or excess/ purulent secretions, fever, chills, sweats, unintended wt loss, pleuritic or exertional cp, hempoptysis, orthopnea pnd or change in chronic leg swelling. Also denies presyncope, palpitations,  heartburn, abdominal pain, nausea, vomiting, diarrhea or change in bowel or urinary habits, dysuria,hematuria, rash, arthralgias, visual complaints, headache, numbness weakness or ataxia.     Objective:   Physical Exam  Gen. Pleasant, well-nourished, in no distress ENT - no thrush, no pallor/icterus,no post nasal drip Neck: No JVD, no thyromegaly, no carotid bruits Lungs: no use of accessory muscles, no dullness to percussion, biasal rales ono rhonchi  Cardiovascular: Rhythm regular, heart sounds  normal, no murmurs or gallops, no peripheral edema Musculoskeletal: No deformities, no cyanosis or clubbing        Assessment & Plan:

## 2021-04-12 NOTE — Assessment & Plan Note (Signed)
We reviewed that he has a progressive pattern of pulmonary fibrosis, probable UIP.  This could possibly be IPF although no honeycombing is noted on CT scan.  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 We discussed side effects of the 2 classes of medications, he had some hesitancy but would be agreeable to try this.  We discussed liver toxicity etc. especially since he is on Zocor Will check LFTs to begin with and then every month for the first 3 months depending on how he tolerates ofev

## 2021-04-13 ENCOUNTER — Other Ambulatory Visit (HOSPITAL_COMMUNITY): Payer: Self-pay

## 2021-04-13 ENCOUNTER — Telehealth: Payer: Self-pay | Admitting: Pharmacy Technician

## 2021-04-13 NOTE — Telephone Encounter (Signed)
Received notification from [facility] regarding a prior authorization for [place]. Authorization has been APPROVED from 08/28/20 to 08/27/21.   Per test claim, copay for 30 days supply is $2,124.87  Authorization # [id number]   Called patient to see if they received [employer] PAP application at [facility], spoke to wife who advised they did not and requested it to be mailed.

## 2021-04-13 NOTE — Telephone Encounter (Signed)
Received New start paperwork for OFEV. Will update as we work through the benefits process.   Submitted a Prior Authorization request to [facility] for OFEV '150mg'$  via CoverMyMeds. Will update once we receive a response.   Key: [id number] - [place] Case ID: [id number]

## 2021-04-15 ENCOUNTER — Encounter: Payer: Self-pay | Admitting: Family Medicine

## 2021-04-15 ENCOUNTER — Ambulatory Visit (INDEPENDENT_AMBULATORY_CARE_PROVIDER_SITE_OTHER): Payer: Medicare Other | Admitting: Family Medicine

## 2021-04-15 ENCOUNTER — Telehealth: Payer: Self-pay

## 2021-04-15 ENCOUNTER — Other Ambulatory Visit: Payer: Self-pay

## 2021-04-15 VITALS — BP 128/74 | HR 78 | Temp 98.5°F | Resp 16 | Ht 72.0 in | Wt 181.0 lb

## 2021-04-15 DIAGNOSIS — E118 Type 2 diabetes mellitus with unspecified complications: Secondary | ICD-10-CM | POA: Diagnosis not present

## 2021-04-15 DIAGNOSIS — E875 Hyperkalemia: Secondary | ICD-10-CM

## 2021-04-15 NOTE — Telephone Encounter (Signed)
Pt spouse just called to give info about meds pt is taking. metFORMIN dosage is '180mg'$  1 tablet BID JANUVIA IS '50mg'$  1 tablet daily  Cb#: [phone]
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
## 2021-04-16 LAB — BASIC METABOLIC PANEL WITH GFR
BUN/Creatinine Ratio: 18 (calc) (ref 6–22)
BUN: 23 mg/dL (ref 7–25)
CO2: 26 mmol/L (ref 20–32)
Calcium: 9.4 mg/dL (ref 8.6–10.3)
Chloride: 105 mmol/L (ref 98–110)
Creat: 1.27 mg/dL — ABNORMAL HIGH (ref 0.70–1.22)
Glucose, Bld: 125 mg/dL — ABNORMAL HIGH (ref 65–99)
Potassium: 4.7 mmol/L (ref 3.5–5.3)
Sodium: 139 mmol/L (ref 135–146)
eGFR: 56 mL/min/{1.73_m2} — ABNORMAL LOW (ref >=60)

## 2021-04-16 LAB — HEMOGLOBIN A1C
Hgb A1c MFr Bld: 6.3 % of total Hgb — ABNORMAL HIGH (ref <5.7)
Mean Plasma Glucose: 134 mg/dL
eAG (mmol/L): 7.4 mmol/L

## 2021-04-16 LAB — MICROALBUMIN, URINE: Microalb, Ur: 7 mg/dL

## 2021-04-20 ENCOUNTER — Other Ambulatory Visit: Payer: Self-pay | Admitting: *Deleted

## 2021-04-20 DIAGNOSIS — E875 Hyperkalemia: Secondary | ICD-10-CM

## 2021-04-27 ENCOUNTER — Other Ambulatory Visit: Payer: Self-pay

## 2021-04-27 ENCOUNTER — Other Ambulatory Visit: Payer: Medicare Other

## 2021-04-27 DIAGNOSIS — E875 Hyperkalemia: Secondary | ICD-10-CM

## 2021-04-28 LAB — BASIC METABOLIC PANEL
BUN/Creatinine Ratio: 15 (calc) (ref 6–22)
BUN: 19 mg/dL (ref 7–25)
CO2: 31 mmol/L (ref 20–32)
Calcium: 9.7 mg/dL (ref 8.6–10.3)
Chloride: 104 mmol/L (ref 98–110)
Creat: 1.29 mg/dL — ABNORMAL HIGH (ref 0.70–1.22)
Glucose, Bld: 112 mg/dL — ABNORMAL HIGH (ref 65–99)
Potassium: 4.7 mmol/L (ref 3.5–5.3)
Sodium: 140 mmol/L (ref 135–146)

## 2021-04-29 NOTE — Telephone Encounter (Signed)
ATC patient to see if he has received O[employer] C[employer] paperwork. Left VM on home phone requesting return call as they will need to also provide income documents for all household members  Knox Saliva, PharmD, MPH, BCPS Clinical Pharmacist (Rheumatology and Pulmonology)

## 2021-05-10 NOTE — Telephone Encounter (Signed)
Called patient's wife Collie Siad, who states that they have not yet received [employer] cares application in mail. Confirmed address on file is correct mailing address. Will mail to patient again.  Wife states that printer they have at home is broken and emailing it not an option  Knox Saliva, PharmD, MPH, BCPS Clinical Pharmacist (Rheumatology and Pulmonology)

## 2021-05-12 ENCOUNTER — Telehealth: Payer: Self-pay | Admitting: Family Medicine

## 2021-05-12 NOTE — Telephone Encounter (Signed)
I attempted to leave message for patient to call back and schedule Medicare Annual Wellness Visit (AWV) in office. No Voice mail.  If not able to come in office, please offer to do virtually or by telephone.   Due for AWVI  Please schedule at anytime with Nurse Health Advisor.

## 2021-06-01 ENCOUNTER — Ambulatory Visit (INDEPENDENT_AMBULATORY_CARE_PROVIDER_SITE_OTHER): Payer: Medicare Other

## 2021-06-01 DIAGNOSIS — I442 Atrioventricular block, complete: Secondary | ICD-10-CM

## 2021-06-01 LAB — CUP PACEART REMOTE DEVICE CHECK
Battery Remaining Longevity: 55 mo
Battery Remaining Percentage: 41 %
Battery Voltage: 2.98 V
Brady Statistic AP VP Percent: 1 %
Brady Statistic AP VS Percent: 1.8 %
Brady Statistic AS VP Percent: 1 %
Brady Statistic AS VS Percent: 98 %
Brady Statistic RA Percent Paced: 1.8 %
Brady Statistic RV Percent Paced: 1 %
Date Time Interrogation Session: 20221005020354
Implantable Lead Implant Date: 20150501
Implantable Lead Implant Date: 20150501
Implantable Lead Location: 753859
Implantable Lead Location: 753860
Implantable Lead Model: 1948
Implantable Pulse Generator Implant Date: 20150501
Lead Channel Impedance Value: 390 Ohm
Lead Channel Impedance Value: 610 Ohm
Lead Channel Pacing Threshold Amplitude: 0.5 V
Lead Channel Pacing Threshold Amplitude: 0.5 V
Lead Channel Pacing Threshold Pulse Width: 0.5 ms
Lead Channel Pacing Threshold Pulse Width: 0.5 ms
Lead Channel Sensing Intrinsic Amplitude: 12 mV
Lead Channel Sensing Intrinsic Amplitude: 5 mV
Lead Channel Setting Pacing Amplitude: 0.75 V
Lead Channel Setting Pacing Amplitude: 1.5 V
Lead Channel Setting Pacing Pulse Width: 0.5 ms
Lead Channel Setting Sensing Sensitivity: 2 mV
Pulse Gen Model: 2240
Pulse Gen Serial Number: 7618665

## 2021-06-01 NOTE — Telephone Encounter (Signed)
Spoke to wife Collie Siad, they received [employer] application and are working on it. They will return to the office.

## 2021-06-08 NOTE — Progress Notes (Signed)
Remote pacemaker transmission.   

## 2021-06-13 NOTE — Telephone Encounter (Signed)
Received patient's completed application via the mail.  Submitted Patient Assistance Application to [facility] for [place] along with provider portion and income documents. Will update patient when we receive a response.  Fax# [phone] Phone# [phone]

## 2021-06-21 NOTE — Telephone Encounter (Signed)
Per Rep, patient was approved and contacted by pharmacy on 06/14/21. Medication delivery was scheduled. Nothing further needed at this time.

## 2021-07-05 ENCOUNTER — Other Ambulatory Visit: Payer: Self-pay | Admitting: Family Medicine

## 2021-07-06 ENCOUNTER — Other Ambulatory Visit: Payer: Self-pay | Admitting: Family Medicine

## 2021-07-18 ENCOUNTER — Ambulatory Visit: Payer: Medicare Other | Admitting: Pulmonary Disease

## 2021-07-18 ENCOUNTER — Encounter: Payer: Self-pay | Admitting: Pulmonary Disease

## 2021-07-18 ENCOUNTER — Other Ambulatory Visit: Payer: Self-pay

## 2021-07-18 VITALS — BP 128/60 | HR 87 | Temp 97.8°F | Ht 72.0 in | Wt 178.0 lb

## 2021-07-18 DIAGNOSIS — J849 Interstitial pulmonary disease, unspecified: Secondary | ICD-10-CM | POA: Diagnosis not present

## 2021-07-18 LAB — HEPATIC FUNCTION PANEL
ALT: 27 U/L (ref 0–53)
AST: 23 U/L (ref 0–37)
Albumin: 4.3 g/dL (ref 3.5–5.2)
Alkaline Phosphatase: 48 U/L (ref 39–117)
Bilirubin, Direct: 0.1 mg/dL (ref 0.0–0.3)
Total Bilirubin: 0.3 mg/dL (ref 0.2–1.2)
Total Protein: 7.4 g/dL (ref 6.0–8.3)

## 2021-07-18 NOTE — Patient Instructions (Signed)
  LFTs every month x 2 Follow-up in January at [facility] on Ofev

## 2021-07-18 NOTE — Progress Notes (Signed)
   Subjective:    Patient ID: Edgar Evans, male    DOB: 10/17/1940, 80 y.o.   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 He has tolerated this well has been taking this for about a month.  Denies stomach upset or diarrhea. He also has hyperlipidemia and is on a statin. 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  patchy areas of peripheral predominant groundglass attenuation and septal thickening mostly in the mid to lower lungs with minimal bronchiectasis, no definite honeycombing hence called "indeterminate" for UIP.    ANA neg, CCP neg, ACE <1, ESR 13     PFTs 10/2020 stable, FVC 69%, TLC 64%, DLCO 83%     PFTs 10/29/2019 - FVC 3.18 (72%), FEV1 2.77 (88%), ratio 87, TLC 55%, DLCOcor 77%, 19.96   PFTs 09/2018 -FVC 67%, FEV1 80%, ratio 86 TLC 57% , DLCO 76%, 19.89  Review of Systems neg for any significant sore throat, dysphagia, itching, sneezing, nasal congestion or excess/ purulent secretions, fever, chills, sweats, unintended wt loss, pleuritic or exertional cp, hempoptysis, orthopnea pnd or change in chronic leg swelling. Also denies presyncope, palpitations, heartburn, abdominal pain,  nausea, vomiting, diarrhea or change in bowel or urinary habits, dysuria,hematuria, rash, arthralgias, visual complaints, headache, numbness weakness or ataxia.     Objective:   Physical Exam   Gen. Pleasant, well-nourished, in no distress ENT - no thrush, no pallor/icterus,no post nasal drip Neck: No JVD, no thyromegaly, no carotid bruits Lungs: no use of accessory muscles, no dullness to percussion, bibasal dry rales or rhonchi  Cardiovascular: Rhythm regular, heart sounds  normal, no murmurs or gallops, no peripheral edema Musculoskeletal: No deformities, no cyanosis or clubbing         Assessment & Plan:   Therapeutic drug monitoring -since he is also on statin, LFTs will need to be monitored aggressively 

## 2021-07-18 NOTE — Assessment & Plan Note (Signed)
He has tolerated Ofev well.  We again discussed risks and benefits of this medication including effect on disease progression. 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 LFTs were checked today which appears normal. He will repeat LFTs in 1 month at the [facility] office and we will see him again in January We will reassess with HRCT in April

## 2021-08-11 ENCOUNTER — Other Ambulatory Visit: Payer: Self-pay

## 2021-08-11 ENCOUNTER — Telehealth: Payer: Self-pay

## 2021-08-11 DIAGNOSIS — J849 Interstitial pulmonary disease, unspecified: Secondary | ICD-10-CM

## 2021-08-11 NOTE — Telephone Encounter (Signed)
Patient came to [facility] office to get LFTs repeated as noted in Dr. Angus Palms note from last OV. Order placed for LFTs today. Nothing further needed.

## 2021-08-12 LAB — HEPATIC FUNCTION PANEL
ALT: 38 IU/L (ref 0–44)
AST: 42 IU/L — ABNORMAL HIGH (ref 0–40)
Albumin: 4.4 g/dL (ref 3.6–4.6)
Alkaline Phosphatase: 57 IU/L (ref 44–121)
Bilirubin Total: 0.3 mg/dL (ref 0.0–1.2)
Bilirubin, Direct: 0.11 mg/dL (ref 0.00–0.40)
Total Protein: 7 g/dL (ref 6.0–8.5)

## 2021-08-15 ENCOUNTER — Other Ambulatory Visit: Payer: Self-pay

## 2021-08-15 DIAGNOSIS — J61 Pneumoconiosis due to asbestos and other mineral fibers: Secondary | ICD-10-CM

## 2021-08-25 ENCOUNTER — Other Ambulatory Visit: Payer: Self-pay

## 2021-08-25 ENCOUNTER — Ambulatory Visit (INDEPENDENT_AMBULATORY_CARE_PROVIDER_SITE_OTHER): Payer: Medicare Other

## 2021-08-25 VITALS — Ht 72.0 in | Wt 178.0 lb

## 2021-08-25 DIAGNOSIS — M109 Gout, unspecified: Secondary | ICD-10-CM | POA: Insufficient documentation

## 2021-08-25 DIAGNOSIS — E559 Vitamin D deficiency, unspecified: Secondary | ICD-10-CM | POA: Insufficient documentation

## 2021-08-25 DIAGNOSIS — N200 Calculus of kidney: Secondary | ICD-10-CM | POA: Insufficient documentation

## 2021-08-25 DIAGNOSIS — N529 Male erectile dysfunction, unspecified: Secondary | ICD-10-CM | POA: Insufficient documentation

## 2021-08-25 DIAGNOSIS — M25569 Pain in unspecified knee: Secondary | ICD-10-CM | POA: Insufficient documentation

## 2021-08-25 DIAGNOSIS — R252 Cramp and spasm: Secondary | ICD-10-CM | POA: Insufficient documentation

## 2021-08-25 DIAGNOSIS — J029 Acute pharyngitis, unspecified: Secondary | ICD-10-CM | POA: Insufficient documentation

## 2021-08-25 DIAGNOSIS — H9190 Unspecified hearing loss, unspecified ear: Secondary | ICD-10-CM | POA: Insufficient documentation

## 2021-08-25 DIAGNOSIS — M79676 Pain in unspecified toe(s): Secondary | ICD-10-CM | POA: Insufficient documentation

## 2021-08-25 DIAGNOSIS — E119 Type 2 diabetes mellitus without complications: Secondary | ICD-10-CM | POA: Insufficient documentation

## 2021-08-25 DIAGNOSIS — Z0000 Encounter for general adult medical examination without abnormal findings: Secondary | ICD-10-CM

## 2021-08-25 DIAGNOSIS — Z5320 Procedure and treatment not carried out because of patient's decision for unspecified reasons: Secondary | ICD-10-CM | POA: Insufficient documentation

## 2021-08-25 DIAGNOSIS — E785 Hyperlipidemia, unspecified: Secondary | ICD-10-CM | POA: Insufficient documentation

## 2021-08-25 DIAGNOSIS — I8390 Asymptomatic varicose veins of unspecified lower extremity: Secondary | ICD-10-CM | POA: Insufficient documentation

## 2021-08-25 DIAGNOSIS — Z135 Encounter for screening for eye and ear disorders: Secondary | ICD-10-CM | POA: Insufficient documentation

## 2021-08-25 NOTE — Patient Instructions (Signed)
[name] , Thank you for taking time to come for your Medicare Wellness Visit. I appreciate your ongoing commitment to your health goals. Please review the following plan we discussed and let me know if I can assist you in the future.   Screening recommendations/referrals: Colonoscopy: No longer required due to age.  Recommended yearly ophthalmology/optometry visit for glaucoma screening and checkup Recommended yearly dental visit for hygiene and checkup  Vaccinations: Influenza vaccine: Done 05/30/2021 Repeat annually  Pneumococcal vaccine: Done 06/15/2010 and 05/21/2014 Tdap vaccine: Due. Repeat in 10 years  Shingles vaccine: Done 04/25/2018 ad 08/27/2018   Covid-19: Done 09/18/2019 and 10/16/2019  Advanced directives: Please bring a copy of your health care power of attorney and living will to the office to be added to your chart at your convenience.   Conditions/risks identified: Aim for 30 minutes of exercise or brisk walking each day, drink 6-8 glasses of water and eat lots of fruits and vegetables.   Next appointment: Follow up in one year for your annual wellness visit. 08/31/2022 @ 9:45 am.  Preventive Care 65 Years and Older, Male  Preventive care refers to lifestyle choices and visits with your health care provider that can promote health and wellness. What does preventive care include? A yearly physical exam. This is also called an annual well check. Dental exams once or twice a year. Routine eye exams. Ask your health care provider how often you should have your eyes checked. Personal lifestyle choices, including: Daily care of your teeth and gums. Regular physical activity. Eating a healthy diet. Avoiding tobacco and drug use. Limiting alcohol use. Practicing safe sex. Taking low doses of aspirin every day. Taking vitamin and mineral supplements as recommended by your health care provider. What happens during an annual well check? The services and screenings done  by your health care provider during your annual well check will depend on your age, overall health, lifestyle risk factors, and family history of disease. Counseling  Your health care provider may ask you questions about your: Alcohol use. Tobacco use. Drug use. Emotional well-being. Home and relationship well-being. Sexual activity. Eating habits. History of falls. Memory and ability to understand (cognition). Work and work Statistician. Screening  You may have the following tests or measurements: Height, weight, and BMI. Blood pressure. Lipid and cholesterol levels. These may be checked every 5 years, or more frequently if you are over 82 years old. Skin check. Lung cancer screening. You may have this screening every year starting at age 47 if you have a 30-pack-year history of smoking and currently smoke or have quit within the past 15 years. Fecal occult blood test (FOBT) of the stool. You may have this test every year starting at age 18. Flexible sigmoidoscopy or colonoscopy. You may have a sigmoidoscopy every 5 years or a colonoscopy every 10 years starting at age 40. Prostate cancer screening. Recommendations will vary depending on your family history and other risks. Hepatitis C blood test. Hepatitis B blood test. Sexually transmitted disease (STD) testing. Diabetes screening. This is done by checking your blood sugar (glucose) after you have not eaten for a while (fasting). You may have this done every 1-3 years. Abdominal aortic aneurysm (AAA) screening. You may need this if you are a current or former smoker. Osteoporosis. You may be screened starting at age 49 if you are at high risk. Talk with your health care provider about your test results, treatment options, and if necessary, the need for more tests. Vaccines  Your  health care provider may recommend certain vaccines, such as: Influenza vaccine. This is recommended every year. Tetanus, diphtheria, and acellular  pertussis (Tdap, Td) vaccine. You may need a Td booster every 10 years. Zoster vaccine. You may need this after age 25. Pneumococcal 13-valent conjugate (PCV13) vaccine. One dose is recommended after age 80. Pneumococcal polysaccharide (PPSV23) vaccine. One dose is recommended after age 43. Talk to your health care provider about which screenings and vaccines you need and how often you need them. This information is not intended to replace advice given to you by your health care provider. Make sure you discuss any questions you have with your health care provider. Document Released: 09/10/2015 Document Revised: 05/03/2016 Document Reviewed: 06/15/2015 Elsevier Interactive Patient Education  2017 [place] Prevention in the Home Falls can cause injuries. They can happen to people of all ages. There are many things you can do to make your home safe and to help prevent falls. What can I do on the outside of my home? Regularly fix the edges of walkways and driveways and fix any cracks. Remove anything that might make you trip as you walk through a door, such as a raised step or threshold. Trim any bushes or trees on the path to your home. Use bright outdoor lighting. Clear any walking paths of anything that might make someone trip, such as rocks or tools. Regularly check to see if handrails are loose or broken. Make sure that both sides of any steps have handrails. Any raised decks and porches should have guardrails on the edges. Have any leaves, snow, or ice cleared regularly. Use sand or salt on walking paths during winter. Clean up any spills in your garage right away. This includes oil or grease spills. What can I do in the bathroom? Use night lights. Install grab bars by the toilet and in the tub and shower. Do not use towel bars as grab bars. Use non-skid mats or decals in the tub or shower. If you need to sit down in the shower, use a plastic, non-slip stool. Keep the floor  dry. Clean up any water that spills on the floor as soon as it happens. Remove soap buildup in the tub or shower regularly. Attach bath mats securely with double-sided non-slip rug tape. Do not have throw rugs and other things on the floor that can make you trip. What can I do in the bedroom? Use night lights. Make sure that you have a light by your bed that is easy to reach. Do not use any sheets or blankets that are too big for your bed. They should not hang down onto the floor. Have a firm chair that has side arms. You can use this for support while you get dressed. Do not have throw rugs and other things on the floor that can make you trip. What can I do in the kitchen? Clean up any spills right away. Avoid walking on wet floors. Keep items that you use a lot in easy-to-reach places. If you need to reach something above you, use a strong step stool that has a grab bar. Keep electrical cords out of the way. Do not use floor polish or wax that makes floors slippery. If you must use wax, use non-skid floor wax. Do not have throw rugs and other things on the floor that can make you trip. What can I do with my stairs? Do not leave any items on the stairs. Make sure that there are handrails  on both sides of the stairs and use them. Fix handrails that are broken or loose. Make sure that handrails are as long as the stairways. Check any carpeting to make sure that it is firmly attached to the stairs. Fix any carpet that is loose or worn. Avoid having throw rugs at the top or bottom of the stairs. If you do have throw rugs, attach them to the floor with carpet tape. Make sure that you have a light switch at the top of the stairs and the bottom of the stairs. If you do not have them, ask someone to add them for you. What else can I do to help prevent falls? Wear shoes that: Do not have high heels. Have rubber bottoms. Are comfortable and fit you well. Are closed at the toe. Do not wear  sandals. If you use a stepladder: Make sure that it is fully opened. Do not climb a closed stepladder. Make sure that both sides of the stepladder are locked into place. Ask someone to hold it for you, if possible. Clearly mark and make sure that you can see: Any grab bars or handrails. First and last steps. Where the edge of each step is. Use tools that help you move around (mobility aids) if they are needed. These include: Canes. Walkers. Scooters. Crutches. Turn on the lights when you go into a dark area. Replace any light bulbs as soon as they burn out. Set up your furniture so you have a clear path. Avoid moving your furniture around. If any of your floors are uneven, fix them. If there are any pets around you, be aware of where they are. Review your medicines with your doctor. Some medicines can make you feel dizzy. This can increase your chance of falling. Ask your doctor what other things that you can do to help prevent falls. This information is not intended to replace advice given to you by your health care provider. Make sure you discuss any questions you have with your health care provider. Document Released: 06/10/2009 Document Revised: 01/20/2016 Document Reviewed: 09/18/2014 Elsevier Interactive Patient Education  2017 [employer].
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
## 2021-08-31 ENCOUNTER — Ambulatory Visit (INDEPENDENT_AMBULATORY_CARE_PROVIDER_SITE_OTHER): Payer: Medicare PPO

## 2021-08-31 DIAGNOSIS — I442 Atrioventricular block, complete: Secondary | ICD-10-CM | POA: Diagnosis not present

## 2021-08-31 LAB — CUP PACEART REMOTE DEVICE CHECK
Battery Remaining Longevity: 53 mo
Battery Remaining Percentage: 39 %
Battery Voltage: 2.98 V
Brady Statistic AP VP Percent: 1 %
Brady Statistic AP VS Percent: 1.9 %
Brady Statistic AS VP Percent: 1 %
Brady Statistic AS VS Percent: 98 %
Brady Statistic RA Percent Paced: 1.9 %
Brady Statistic RV Percent Paced: 1 %
Date Time Interrogation Session: 20230104020013
Implantable Lead Implant Date: 20150501
Implantable Lead Implant Date: 20150501
Implantable Lead Location: 753859
Implantable Lead Location: 753860
Implantable Lead Model: 1948
Implantable Pulse Generator Implant Date: 20150501
Lead Channel Impedance Value: 410 Ohm
Lead Channel Impedance Value: 640 Ohm
Lead Channel Pacing Threshold Amplitude: 0.5 V
Lead Channel Pacing Threshold Amplitude: 0.5 V
Lead Channel Pacing Threshold Pulse Width: 0.5 ms
Lead Channel Pacing Threshold Pulse Width: 0.5 ms
Lead Channel Sensing Intrinsic Amplitude: 12 mV
Lead Channel Sensing Intrinsic Amplitude: 5 mV
Lead Channel Setting Pacing Amplitude: 0.75 V
Lead Channel Setting Pacing Amplitude: 1.5 V
Lead Channel Setting Pacing Pulse Width: 0.5 ms
Lead Channel Setting Sensing Sensitivity: 2 mV
Pulse Gen Model: 2240
Pulse Gen Serial Number: 7618665

## 2021-09-06 LAB — HEPATIC FUNCTION PANEL
ALT: 16 IU/L (ref 0–44)
AST: 20 IU/L (ref 0–40)
Albumin: 4.2 g/dL (ref 3.6–4.6)
Alkaline Phosphatase: 48 IU/L (ref 44–121)
Bilirubin Total: 0.2 mg/dL (ref 0.0–1.2)
Bilirubin, Direct: <0.1 mg/dL (ref 0.00–0.40)
Total Protein: 6.9 g/dL (ref 6.0–8.5)

## 2021-09-12 NOTE — Progress Notes (Signed)
Remote pacemaker transmission.   

## 2021-09-16 ENCOUNTER — Encounter: Payer: Self-pay | Admitting: Pulmonary Disease

## 2021-09-16 ENCOUNTER — Other Ambulatory Visit: Payer: Self-pay

## 2021-09-16 ENCOUNTER — Ambulatory Visit: Payer: Medicare PPO | Admitting: Pulmonary Disease

## 2021-09-16 DIAGNOSIS — J849 Interstitial pulmonary disease, unspecified: Secondary | ICD-10-CM | POA: Diagnosis not present

## 2021-09-16 MED ORDER — OFEV 150 MG PO CAPS: 150.0000 mg | ORAL_CAPSULE | Freq: Two times a day (BID) | ORAL | 3 refills | Status: DC

## 2021-09-16 NOTE — Assessment & Plan Note (Addendum)
Clinically appears stable. We will repeat PFTs and HRCT prior to next visit in 3 months to monitor objectively He is active at home so does not want to enroll and center-based rehab
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
## 2021-09-16 NOTE — Patient Instructions (Signed)
°  X refills on ofev  X check LFTs next visit  HRCT in April PFTs before next visit

## 2021-09-19 ENCOUNTER — Telehealth: Payer: Self-pay | Admitting: Pharmacist

## 2021-09-19 NOTE — Telephone Encounter (Signed)
Rx for patient's Ofev was sent to [facility] instead of [facility]. Per hcart review, patient has not completed BI Cares PAP renewal application. He states he has three days of medication remaining and confirms that he did not complete application  Since last year, patient changed from [facility] to [facility]. Submitted a Prior Authorization request to [facility] for OFEV via CoverMyMeds. Will update once we receive a response.  Key: [id number]  Patient is stopping by clinic on Wednesday, 09/21/21. He has been advised to bring income documents - he did state htat he does not have any documents but I advised that application requires income proof. Provider portion placed in Dr. Bari Mantis mailbox to be signed.  Patient forms placed in filing cabinet at front office  Knox Saliva, PharmD, MPH, BCPS Clinical Pharmacist (Rheumatology and Pulmonology)

## 2021-09-21 ENCOUNTER — Ambulatory Visit (INDEPENDENT_AMBULATORY_CARE_PROVIDER_SITE_OTHER): Payer: Medicare PPO | Admitting: Pulmonary Disease

## 2021-09-21 ENCOUNTER — Other Ambulatory Visit (HOSPITAL_COMMUNITY): Payer: Self-pay

## 2021-09-21 ENCOUNTER — Other Ambulatory Visit: Payer: Self-pay

## 2021-09-21 DIAGNOSIS — J849 Interstitial pulmonary disease, unspecified: Secondary | ICD-10-CM

## 2021-09-21 LAB — PULMONARY FUNCTION TEST
DL/VA % pred: 120 %
DL/VA: 4.61 ml/min/mmHg/L
DLCO cor % pred: 85 %
DLCO cor: 21.91 ml/min/mmHg
DLCO unc % pred: 85 %
DLCO unc: 21.91 ml/min/mmHg
FEF 25-75 Post: 4.6 L/sec
FEF 25-75 Pre: 3.16 L/sec
FEF2575-%Change-Post: 45 %
FEF2575-%Pred-Post: 222 %
FEF2575-%Pred-Pre: 153 %
FEV1-%Change-Post: 10 %
FEV1-%Pred-Post: 90 %
FEV1-%Pred-Pre: 82 %
FEV1-Post: 2.77 L
FEV1-Pre: 2.51 L
FEV1FVC-%Change-Post: 7 %
FEV1FVC-%Pred-Pre: 118 %
FEV6-%Change-Post: 2 %
FEV6-%Pred-Post: 76 %
FEV6-%Pred-Pre: 74 %
FEV6-Post: 3.07 L
FEV6-Pre: 2.99 L
FEV6FVC-%Pred-Post: 107 %
FEV6FVC-%Pred-Pre: 107 %
FVC-%Change-Post: 2 %
FVC-%Pred-Post: 71 %
FVC-%Pred-Pre: 69 %
FVC-Post: 3.07 L
FVC-Pre: 2.99 L
Post FEV1/FVC ratio: 90 %
Post FEV6/FVC ratio: 100 %
Pre FEV1/FVC ratio: 84 %
Pre FEV6/FVC Ratio: 100 %
RV % pred: 64 %
RV: 1.81 L
TLC % pred: 66 %
TLC: 4.96 L

## 2021-09-21 NOTE — Progress Notes (Signed)
Full PFT performed today. °

## 2021-09-21 NOTE — Telephone Encounter (Addendum)
Received notification from [facility] regarding a prior authorization for [place]. Authorization has been APPROVED from 09/21/21 to 08/27/22.   Per test claim, copay for 30 days supply is $3187.27  Will attach to Ofev BI Cares PAP renewal application.  Knox Saliva, PharmD, MPH, BCPS Clinical Pharmacist (Rheumatology and Pulmonology)

## 2021-09-21 NOTE — Telephone Encounter (Signed)
Per rep, patient's Ofev authorization requires clinicals that were not submitted. Restarted URGENT authorization over the phone. Clinicals faxed to [facility]. Turnaround time is 24 hours.  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

## 2021-09-21 NOTE — Patient Instructions (Signed)
Full PFT performed today. °

## 2021-09-23 NOTE — Telephone Encounter (Signed)
Remaining paperwork has been gathered up, we are still missing income documents. Reached out to pt to provide update, he states that he is planning to physically get ahold of new documents next week. States he could fax them to [place], [phone] fax number provided. Placing application in "pending info" folder while we await financial documentation.

## 2021-09-27 NOTE — Telephone Encounter (Signed)
Submitted Patient Assistance RENEWAL Application to [place] for OFEV along with provider portion, patient portion, PA and income documents. Will update patient when we receive a response.  Fax# [phone] Phone# [phone]  Knox Saliva, PharmD, MPH, BCPS Clinical Pharmacist (Rheumatology and Pulmonology)

## 2021-09-28 NOTE — Telephone Encounter (Signed)
Received a fax from  [facility] regarding an approval for [place] patient assistance from 09/28/2021 to 08/27/2022. Approval letter sent to scan center.

## 2021-10-13 ENCOUNTER — Other Ambulatory Visit: Payer: Self-pay | Admitting: *Deleted

## 2021-10-13 MED ORDER — OFEV 150 MG PO CAPS: 150.0000 mg | ORAL_CAPSULE | Freq: Two times a day (BID) | ORAL | 3 refills | Status: DC

## 2021-10-26 DIAGNOSIS — N35013 Post-traumatic anterior urethral stricture: Secondary | ICD-10-CM | POA: Diagnosis not present

## 2021-10-26 DIAGNOSIS — N320 Bladder-neck obstruction: Secondary | ICD-10-CM | POA: Diagnosis not present

## 2021-10-26 DIAGNOSIS — N210 Calculus in bladder: Secondary | ICD-10-CM | POA: Diagnosis not present

## 2021-10-31 ENCOUNTER — Other Ambulatory Visit: Payer: Self-pay | Admitting: Pharmacist

## 2021-10-31 DIAGNOSIS — J849 Interstitial pulmonary disease, unspecified: Secondary | ICD-10-CM

## 2021-10-31 MED ORDER — OFEV 150 MG PO CAPS: 150.0000 mg | ORAL_CAPSULE | Freq: Two times a day (BID) | ORAL | 1 refills | Status: DC

## 2021-10-31 NOTE — Telephone Encounter (Signed)
Refill sent for OFEV to [place] for Ofev: [phone] ([place]) ? ?Dose: 150 mg twice daily ? ?Last OV: 09/16/21 ?Provider: Dr. Elsworth Soho ? ?Next OV: 12/09/21 ? ?LFTs on 09/05/21 wnl ? ?Knox Saliva, PharmD, MPH, BCPS ?Clinical Pharmacist (Rheumatology and Pulmonology)  ?

## 2021-11-09 ENCOUNTER — Telehealth: Payer: Self-pay | Admitting: Pulmonary Disease

## 2021-11-09 NOTE — Telephone Encounter (Signed)
Refill for Ofev was sent on 10/31/21. Called patient to advise him to reach out to [facility] to schedule shipment. He verbalized understanding. ? ?Knox Saliva, PharmD, MPH, BCPS ?Clinical Pharmacist (Rheumatology and Pulmonology) ?

## 2021-11-30 ENCOUNTER — Ambulatory Visit (INDEPENDENT_AMBULATORY_CARE_PROVIDER_SITE_OTHER): Payer: Medicare PPO

## 2021-11-30 DIAGNOSIS — I442 Atrioventricular block, complete: Secondary | ICD-10-CM | POA: Diagnosis not present

## 2021-11-30 LAB — CUP PACEART REMOTE DEVICE CHECK
Battery Remaining Longevity: 49 mo
Battery Remaining Percentage: 37 %
Battery Voltage: 2.96 V
Brady Statistic AP VP Percent: 1 %
Brady Statistic AP VS Percent: 1.5 %
Brady Statistic AS VP Percent: 1 %
Brady Statistic AS VS Percent: 98 %
Brady Statistic RA Percent Paced: 1.5 %
Brady Statistic RV Percent Paced: 1 %
Date Time Interrogation Session: 20230405020013
Implantable Lead Implant Date: 20150501
Implantable Lead Implant Date: 20150501
Implantable Lead Location: 753859
Implantable Lead Location: 753860
Implantable Lead Model: 1948
Implantable Pulse Generator Implant Date: 20150501
Lead Channel Impedance Value: 390 Ohm
Lead Channel Impedance Value: 610 Ohm
Lead Channel Pacing Threshold Amplitude: 0.375 V
Lead Channel Pacing Threshold Amplitude: 0.5 V
Lead Channel Pacing Threshold Pulse Width: 0.5 ms
Lead Channel Pacing Threshold Pulse Width: 0.5 ms
Lead Channel Sensing Intrinsic Amplitude: 12 mV
Lead Channel Sensing Intrinsic Amplitude: 5 mV
Lead Channel Setting Pacing Amplitude: 0.75 V
Lead Channel Setting Pacing Amplitude: 1.375
Lead Channel Setting Pacing Pulse Width: 0.5 ms
Lead Channel Setting Sensing Sensitivity: 2 mV
Pulse Gen Model: 2240
Pulse Gen Serial Number: 7618665

## 2021-12-05 ENCOUNTER — Ambulatory Visit (HOSPITAL_COMMUNITY)
Admission: RE | Admit: 2021-12-05 | Discharge: 2021-12-05 | Disposition: A | Discharge status: Home / Self Care | Payer: Medicare PPO | Source: Ambulatory Visit | Attending: Pulmonary Disease | Admitting: Pulmonary Disease

## 2021-12-05 DIAGNOSIS — I2510 Atherosclerotic heart disease of native coronary artery without angina pectoris: Secondary | ICD-10-CM | POA: Diagnosis not present

## 2021-12-05 DIAGNOSIS — J849 Interstitial pulmonary disease, unspecified: Secondary | ICD-10-CM | POA: Diagnosis not present

## 2021-12-05 DIAGNOSIS — J439 Emphysema, unspecified: Secondary | ICD-10-CM | POA: Diagnosis not present

## 2021-12-05 DIAGNOSIS — J84112 Idiopathic pulmonary fibrosis: Secondary | ICD-10-CM | POA: Diagnosis not present

## 2021-12-05 DIAGNOSIS — J929 Pleural plaque without asbestos: Secondary | ICD-10-CM | POA: Diagnosis not present

## 2021-12-06 ENCOUNTER — Encounter: Payer: Self-pay | Admitting: Pulmonary Disease

## 2021-12-06 ENCOUNTER — Ambulatory Visit: Payer: Medicare PPO | Admitting: Pulmonary Disease

## 2021-12-06 DIAGNOSIS — J61 Pneumoconiosis due to asbestos and other mineral fibers: Secondary | ICD-10-CM

## 2021-12-06 DIAGNOSIS — J849 Interstitial pulmonary disease, unspecified: Secondary | ICD-10-CM | POA: Diagnosis not present

## 2021-12-06 NOTE — Assessment & Plan Note (Signed)
PFTs reviewed which appears stable FVC and DLCO which is reassuring. ?High-resolution CT images independently reviewed which shows stable disease ? ?He has tolerated Ofev reasonably well and we will continue ?Check LFTs in 3 months ?

## 2021-12-06 NOTE — Progress Notes (Signed)
? ?  Subjective:  ? ? Patient ID: Edgar Evans, male    DOB: [birth year], 81 y.o.   MRN: [id number] ? ?HPI ? 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  ?he quit smoking in 1985, about 40 pack years.  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  ?PMH - second degree AVB s/p PPM ?  ?- Started Ofev 03/2021 due to slight drop in lung function and worsening HRCT. ? ?Chief Complaint  ?Patient presents with  ? Follow-up  ?  PFT done 1/25 CT done on 12/05/2021.  ?SOB same since last ov   ? ?For some reason, he did not have Ofev shipment last month so he has been off medications for a month.  We had gotten this approved in February and again confirmed shipment in March. ?He is expecting delivery in a couple of days ?Breathing is otherwise stable, denies coughing. ?Last LFTs reviewed. ?We reviewed PFTs and high-resolution CT ? ? ?Significant tests/ events reviewed ? 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  ?HRCT  08/29/2018   It showed bilateral calcified and noncalcified pleural plaques most evident over the right hemidiaphragm.  patchy areas of peripheral predominant groundglass attenuation and septal thickening mostly in the mid to lower lungs with minimal bronchiectasis, no definite honeycombing hence called "indeterminate" for UIP.  ?  ?ANA neg, CCP neg, ACE <1, ESR 13 ?  ? ?PFTs 08/2021 mild restriction, FVC stable at 69%, TLC 66%, DLCO 21.9/85% ?PFTs 10/2020 stable, FVC 69%, TLC 64%, DLCO 83% ?  ?  ?PFTs 10/29/2019 - FVC 3.18 (72%), FEV1 2.77 (88%), ratio 87, TLC 55%, DLCOcor 77%, 19.96 ?  ?PFTs 09/2018 -FVC 67%, FEV1 80%,  ratio 86 TLC 57% , DLCO 76%, 19.89 ? ?Review of Systems ?neg for any significant sore throat, dysphagia, itching, sneezing, nasal congestion or excess/ purulent secretions, fever, chills, sweats, unintended wt loss, pleuritic or exertional cp, hempoptysis, orthopnea pnd or change in chronic leg swelling. Also denies presyncope, palpitations, heartburn, abdominal pain, nausea, vomiting, diarrhea or change in bowel or urinary habits, dysuria,hematuria, rash, arthralgias, visual complaints, headache, numbness weakness or ataxia. ? ?   ?Objective:  ? Physical Exam ? ?Gen. Pleasant, elderly,well-nourished, in no distress ?ENT - no thrush, no pallor/icterus,no post nasal drip ?Neck: No JVD, no thyromegaly, no carotid bruits ?Lungs: no use of accessory muscles, no dullness to percussion, dry BB rales ?Cardiovascular: Rhythm regular, heart sounds  normal, no murmurs or gallops, no peripheral edema ?Musculoskeletal: No deformities, no cyanosis or clubbing  ? ? ? ?   ?Assessment & Plan:  ? ? ?

## 2021-12-06 NOTE — Assessment & Plan Note (Signed)
Some evidence of asbestos exposure in terms of pleural plaques on CT ?

## 2021-12-06 NOTE — Patient Instructions (Signed)
?  Get back on Ofev ? ?PFTs are stable ? ?Check LFTs next visit ?

## 2021-12-09 ENCOUNTER — Ambulatory Visit: Payer: Medicare PPO | Admitting: Pulmonary Disease

## 2021-12-15 NOTE — Progress Notes (Signed)
Remote pacemaker transmission.   

## 2021-12-30 ENCOUNTER — Encounter: Payer: Medicare PPO | Admitting: Internal Medicine

## 2022-01-06 ENCOUNTER — Ambulatory Visit (INDEPENDENT_AMBULATORY_CARE_PROVIDER_SITE_OTHER): Payer: Medicare PPO | Admitting: Internal Medicine

## 2022-01-06 ENCOUNTER — Encounter: Payer: Self-pay | Admitting: Internal Medicine

## 2022-01-06 VITALS — BP 130/64 | HR 65 | Ht 72.0 in | Wt 187.8 lb

## 2022-01-06 DIAGNOSIS — I442 Atrioventricular block, complete: Secondary | ICD-10-CM | POA: Diagnosis not present

## 2022-01-06 DIAGNOSIS — I10 Essential (primary) hypertension: Secondary | ICD-10-CM

## 2022-01-06 DIAGNOSIS — I441 Atrioventricular block, second degree: Secondary | ICD-10-CM
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
## 2022-01-06 NOTE — Patient Instructions (Addendum)

## 2022-01-11 LAB — CUP PACEART INCLINIC DEVICE CHECK
Battery Remaining Longevity: 50 mo
Battery Voltage: 2.96 V
Brady Statistic RA Percent Paced: 1.4 %
Brady Statistic RV Percent Paced: 0.07 %
Date Time Interrogation Session: 20230512074200
Implantable Lead Implant Date: 20150501
Implantable Lead Implant Date: 20150501
Implantable Lead Location: 753859
Implantable Lead Location: 753860
Implantable Lead Model: 1948
Implantable Pulse Generator Implant Date: 20150501
Lead Channel Impedance Value: 387.5 Ohm
Lead Channel Impedance Value: 612.5 Ohm
Lead Channel Pacing Threshold Amplitude: 0.5 V
Lead Channel Pacing Threshold Amplitude: 0.5 V
Lead Channel Pacing Threshold Amplitude: 0.75 V
Lead Channel Pacing Threshold Amplitude: 0.75 V
Lead Channel Pacing Threshold Pulse Width: 0.5 ms
Lead Channel Pacing Threshold Pulse Width: 0.5 ms
Lead Channel Pacing Threshold Pulse Width: 0.5 ms
Lead Channel Pacing Threshold Pulse Width: 0.5 ms
Lead Channel Sensing Intrinsic Amplitude: 12 mV
Lead Channel Sensing Intrinsic Amplitude: 5 mV
Lead Channel Setting Pacing Amplitude: 0.875
Lead Channel Setting Pacing Amplitude: 1.5 V
Lead Channel Setting Pacing Pulse Width: 0.5 ms
Lead Channel Setting Sensing Sensitivity: 2 mV
Pulse Gen Model: 2240
Pulse Gen Serial Number: 7618665

## 2022-01-16 ENCOUNTER — Ambulatory Visit: Payer: Medicare PPO | Admitting: Pulmonary Disease

## 2022-02-03 ENCOUNTER — Telehealth: Payer: Self-pay | Admitting: Internal Medicine

## 2022-02-03 NOTE — Telephone Encounter (Signed)
  1. Has your device fired? no  2. Is you device beeping? no  3. Are you experiencing draining or swelling at device site? no  4. Are you calling to see if we received your device transmission? no  5. Have you passed out? No   Patient states he had his phone line removed and would like to know if this interrupts his monitor.    Please route to [place]

## 2022-02-07 NOTE — Telephone Encounter (Signed)
I let the patient know that I ordered him a cell adapter for his monitor. He should receive it in 5-14 business days.

## 2022-03-01 ENCOUNTER — Ambulatory Visit (INDEPENDENT_AMBULATORY_CARE_PROVIDER_SITE_OTHER): Payer: Medicare PPO

## 2022-03-01 DIAGNOSIS — I441 Atrioventricular block, second degree: Secondary | ICD-10-CM

## 2022-03-02 LAB — CUP PACEART REMOTE DEVICE CHECK
Battery Remaining Longevity: 47 mo
Battery Remaining Percentage: 35 %
Battery Voltage: 2.96 V
Brady Statistic AP VP Percent: 1 %
Brady Statistic AP VS Percent: 1 %
Brady Statistic AS VP Percent: 1 %
Brady Statistic AS VS Percent: 99 %
Brady Statistic RA Percent Paced: 1 %
Brady Statistic RV Percent Paced: 1 %
Date Time Interrogation Session: 20230705021437
Implantable Lead Implant Date: 20150501
Implantable Lead Implant Date: 20150501
Implantable Lead Location: 753859
Implantable Lead Location: 753860
Implantable Lead Model: 1948
Implantable Pulse Generator Implant Date: 20150501
Lead Channel Impedance Value: 390 Ohm
Lead Channel Impedance Value: 610 Ohm
Lead Channel Pacing Threshold Amplitude: 0.5 V
Lead Channel Pacing Threshold Amplitude: 0.625 V
Lead Channel Pacing Threshold Pulse Width: 0.5 ms
Lead Channel Pacing Threshold Pulse Width: 0.5 ms
Lead Channel Sensing Intrinsic Amplitude: 12 mV
Lead Channel Sensing Intrinsic Amplitude: 5 mV
Lead Channel Setting Pacing Amplitude: 0.875
Lead Channel Setting Pacing Amplitude: 1.5 V
Lead Channel Setting Pacing Pulse Width: 0.5 ms
Lead Channel Setting Sensing Sensitivity: 2 mV
Pulse Gen Model: 2240
Pulse Gen Serial Number: 7618665

## 2022-03-21 ENCOUNTER — Encounter: Payer: Self-pay | Admitting: Pulmonary Disease

## 2022-03-21 ENCOUNTER — Ambulatory Visit: Payer: Medicare PPO | Admitting: Pulmonary Disease

## 2022-03-21 DIAGNOSIS — J849 Interstitial pulmonary disease, unspecified: Secondary | ICD-10-CM

## 2022-03-21 NOTE — Progress Notes (Signed)
   Subjective:    Patient ID: Edgar Evans, male    DOB: 02/22/1941, 81 y.o.   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  Chief Complaint  Patient presents with   Follow-up    Dickey Gave is making patient sick.  Breathing is doing okay.    He stopped taking Ofev about a month ago.  He developed side effects including abdominal pain and nausea.  He felt he could not tolerate this anymore.  He does not want to restart. Breathing is stable, he reports occasional cough. Dyspnea is at baseline. 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  patchy areas of peripheral predominant groundglass attenuation and septal thickening mostly in the mid to lower lungs with minimal bronchiectasis, no definite honeycombing hence called "indeterminate" for UIP.    ANA neg, CCP neg, ACE <1, ESR 13     PFTs 08/2021 mild restriction, FVC stable at 69%, TLC 66%, DLCO 21.9/85% PFTs 10/2020 stable, FVC 69%, TLC 64%, DLCO 83%     PFTs 10/29/2019 - FVC 3.18 (72%), FEV1 2.77 (88%), ratio 87, TLC 55%, DLCOcor 77%, 19.96   PFTs 09/2018 -FVC 67%, FEV1 80%, ratio 86 TLC 57% , DLCO 76%, 19.89  Review of  Systems neg for any significant sore throat, dysphagia, itching, sneezing, nasal congestion or excess/ purulent secretions, fever, chills, sweats, unintended wt loss, pleuritic or exertional cp, hempoptysis, orthopnea pnd or change in chronic leg swelling. Also denies presyncope, palpitations, heartburn, abdominal pain, nausea, vomiting, diarrhea or change in bowel or urinary habits, dysuria,hematuria, rash, arthralgias, visual complaints, headache, numbness weakness or ataxia.     Objective:   Physical Exam  Gen. Pleasant, well-nourished, in no distress ENT - no thrush, no pallor/icterus,no post nasal drip Neck: No JVD, no thyromegaly, no carotid bruits Lungs: no use of accessory muscles, no dullness to percussion, bibasal dry rales no rhonchi  Cardiovascular: Rhythm regular, heart sounds  normal, no murmurs or gallops, no peripheral edema Musculoskeletal: No deformities, no cyanosis or clubbing        Assessment & Plan:

## 2022-03-21 NOTE — Assessment & Plan Note (Signed)
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  I explained that there could be.  Of stability but a flare could happen anytime.  Antifibrotic's would decrease the chances of such a flare. I discussed alternative medication pirfenidone and side effect profile. He really does not want to try any medications anymore.  He would rather take the risk for flare and just observe what happens.  He mentions his advanced age. This was a shared decision making discussion and he prefers quality of life.  We will hold off on further antifibrotic's and monitor him every 6 months for worsening fibrosis.  I explained the possibility that he may need oxygen in the future

## 2022-03-21 NOTE — Patient Instructions (Signed)
  We discussed alternative medication for fibrosis called Perfenidone We decided to hold of on further Rx

## 2022-03-22 NOTE — Progress Notes (Signed)
Remote pacemaker transmission.   

## 2022-05-12 ENCOUNTER — Ambulatory Visit (HOSPITAL_COMMUNITY)
Admission: EM | Admit: 2022-05-12 | Discharge: 2022-05-12 | Disposition: A | Discharge status: Home / Self Care | Payer: Medicare PPO | Attending: Physician Assistant | Admitting: Physician Assistant

## 2022-05-12 ENCOUNTER — Ambulatory Visit (INDEPENDENT_AMBULATORY_CARE_PROVIDER_SITE_OTHER): Payer: Medicare PPO

## 2022-05-12 ENCOUNTER — Encounter (HOSPITAL_COMMUNITY): Payer: Self-pay

## 2022-05-12 DIAGNOSIS — M7732 Calcaneal spur, left foot: Secondary | ICD-10-CM | POA: Diagnosis not present

## 2022-05-12 DIAGNOSIS — M25572 Pain in left ankle and joints of left foot: Secondary | ICD-10-CM

## 2022-05-12 DIAGNOSIS — S96912A Strain of unspecified muscle and tendon at ankle and foot level, left foot, initial encounter: Secondary | ICD-10-CM | POA: Diagnosis not present

## 2022-05-12 DIAGNOSIS — S99912A Unspecified injury of left ankle, initial encounter: Secondary | ICD-10-CM | POA: Diagnosis not present

## 2022-05-12 DIAGNOSIS — M7989 Other specified soft tissue disorders: Secondary | ICD-10-CM | POA: Diagnosis not present

## 2022-05-12 NOTE — Discharge Instructions (Signed)
Advised to use Tylenol or ibuprofen to help reduce the pain and swelling. Advised to use ice therapy, 10 minutes on 20 minutes off, 3-4 times throughout the day to help reduce pain and swelling. Advised to use the ankle support when up and walking around to help decrease the pain. Advised to follow-up with PCP or return to urgent care if symptoms fail to improve.

## 2022-05-12 NOTE — ED Triage Notes (Signed)
Pt states he stepped in a hole 2 days ago,C/O left foot pain. Pt ambulates with a limp. States he put ice on it last night with no relief.
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
## 2022-05-31 ENCOUNTER — Ambulatory Visit (INDEPENDENT_AMBULATORY_CARE_PROVIDER_SITE_OTHER): Payer: Medicare PPO

## 2022-05-31 DIAGNOSIS — I442 Atrioventricular block, complete: Secondary | ICD-10-CM

## 2022-05-31 LAB — CUP PACEART REMOTE DEVICE CHECK
Battery Remaining Longevity: 44 mo
Battery Remaining Percentage: 33 %
Battery Voltage: 2.96 V
Brady Statistic AP VP Percent: 1 %
Brady Statistic AP VS Percent: 1.1 %
Brady Statistic AS VP Percent: 1 %
Brady Statistic AS VS Percent: 99 %
Brady Statistic RA Percent Paced: 1.1 %
Brady Statistic RV Percent Paced: 1 %
Date Time Interrogation Session: 20231004020013
Implantable Lead Implant Date: 20150501
Implantable Lead Implant Date: 20150501
Implantable Lead Location: 753859
Implantable Lead Location: 753860
Implantable Lead Model: 1948
Implantable Pulse Generator Implant Date: 20150501
Lead Channel Impedance Value: 400 Ohm
Lead Channel Impedance Value: 600 Ohm
Lead Channel Pacing Threshold Amplitude: 0.5 V
Lead Channel Pacing Threshold Amplitude: 0.625 V
Lead Channel Pacing Threshold Pulse Width: 0.5 ms
Lead Channel Pacing Threshold Pulse Width: 0.5 ms
Lead Channel Sensing Intrinsic Amplitude: 10.5 mV
Lead Channel Sensing Intrinsic Amplitude: 5 mV
Lead Channel Setting Pacing Amplitude: 0.875
Lead Channel Setting Pacing Amplitude: 1.5 V
Lead Channel Setting Pacing Pulse Width: 0.5 ms
Lead Channel Setting Sensing Sensitivity: 2 mV
Pulse Gen Model: 2240
Pulse Gen Serial Number: 7618665

## 2022-06-05 NOTE — Progress Notes (Signed)
Remote pacemaker transmission.   

## 2022-08-30 ENCOUNTER — Ambulatory Visit (INDEPENDENT_AMBULATORY_CARE_PROVIDER_SITE_OTHER): Payer: Medicare PPO

## 2022-08-30 DIAGNOSIS — I442 Atrioventricular block, complete: Secondary | ICD-10-CM

## 2022-08-30 LAB — CUP PACEART REMOTE DEVICE CHECK
Battery Remaining Longevity: 41 mo
Battery Remaining Percentage: 31 %
Battery Voltage: 2.95 V
Brady Statistic AP VP Percent: 1 %
Brady Statistic AP VS Percent: 1.4 %
Brady Statistic AS VP Percent: 1 %
Brady Statistic AS VS Percent: 98 %
Brady Statistic RA Percent Paced: 1.3 %
Brady Statistic RV Percent Paced: 1 %
Date Time Interrogation Session: 20240103020014
Implantable Lead Connection Status: 753985
Implantable Lead Connection Status: 753985
Implantable Lead Implant Date: 20150501
Implantable Lead Implant Date: 20150501
Implantable Lead Location: 753859
Implantable Lead Location: 753860
Implantable Lead Model: 1948
Implantable Pulse Generator Implant Date: 20150501
Lead Channel Impedance Value: 400 Ohm
Lead Channel Impedance Value: 600 Ohm
Lead Channel Pacing Threshold Amplitude: 0.5 V
Lead Channel Pacing Threshold Amplitude: 0.5 V
Lead Channel Pacing Threshold Pulse Width: 0.5 ms
Lead Channel Pacing Threshold Pulse Width: 0.5 ms
Lead Channel Sensing Intrinsic Amplitude: 5 mV
Lead Channel Sensing Intrinsic Amplitude: 9.6 mV
Lead Channel Setting Pacing Amplitude: 0.75 V
Lead Channel Setting Pacing Amplitude: 1.5 V
Lead Channel Setting Pacing Pulse Width: 0.5 ms
Lead Channel Setting Sensing Sensitivity: 2 mV
Pulse Gen Model: 2240
Pulse Gen Serial Number: 7618665
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
## 2022-08-31 ENCOUNTER — Ambulatory Visit (INDEPENDENT_AMBULATORY_CARE_PROVIDER_SITE_OTHER): Payer: Medicare PPO

## 2022-08-31 VITALS — Ht 72.0 in | Wt 182.0 lb

## 2022-08-31 DIAGNOSIS — Z0000 Encounter for general adult medical examination without abnormal findings: Secondary | ICD-10-CM

## 2022-08-31 NOTE — Patient Instructions (Addendum)
Edgar Evans , Thank you for taking time to come for your Medicare Wellness Visit. I appreciate your ongoing commitment to your health goals. Please review the following plan we discussed and let me know if I can assist you in the future.   These are the goals we discussed:  Goals      Exercise 3x per week (30 min per time)     Try to increase exercise some each day.        This is a list of the screening recommended for you and due dates:  Health Maintenance  Topic Date Due   Eye exam for diabetics  06/16/2022   COVID-19 Vaccine (3 - 2023-24 season) 09/16/2022*   Yearly kidney function blood test for diabetes  11/26/2022*   Yearly kidney health urinalysis for diabetes  11/26/2022*   Complete foot exam   11/26/2022*   Hemoglobin A1C  11/26/2022*   Medicare Annual Wellness Visit  09/01/2023   Pneumonia Vaccine  Completed   Flu Shot  Completed   Zoster (Shingles) Vaccine  Completed   HPV Vaccine  Aged Out   DTaP/Tdap/Td vaccine  Discontinued  *Topic was postponed. The date shown is not the original due date.    Advanced directives: Advance directive discussed with you today. I have provided a copy for you to complete at home and have notarized. Once this is complete please bring a copy in to our office so we can scan it into your chart.   Conditions/risks identified: Aim for 30 minutes of exercise or brisk walking, 6-8 glasses of water, and 5 servings of fruits and vegetables each day.   Next appointment: Follow up in one year for your annual wellness visit.   Preventive Care 34 Years and Older, Male  Preventive care refers to lifestyle choices and visits with your health care provider that can promote health and wellness. What does preventive care include? A yearly physical exam. This is also called an annual well check. Dental exams once or twice a year. Routine eye exams. Ask your health care provider how often you should have your eyes checked. Personal lifestyle choices,  including: Daily care of your teeth and gums. Regular physical activity. Eating a healthy diet. Avoiding tobacco and drug use. Limiting alcohol use. Practicing safe sex. Taking low doses of aspirin every day. Taking vitamin and mineral supplements as recommended by your health care provider. What happens during an annual well check? The services and screenings done by your health care provider during your annual well check will depend on your age, overall health, lifestyle risk factors, and family history of disease. Counseling  Your health care provider may ask you questions about your: Alcohol use. Tobacco use. Drug use. Emotional well-being. Home and relationship well-being. Sexual activity. Eating habits. History of falls. Memory and ability to understand (cognition). Work and work Statistician. Screening  You may have the following tests or measurements: Height, weight, and BMI. Blood pressure. Lipid and cholesterol levels. These may be checked every 5 years, or more frequently if you are over 56 years old. Skin check. Lung cancer screening. You may have this screening every year starting at age 84 if you have a 30-pack-year history of smoking and currently smoke or have quit within the past 15 years. Fecal occult blood test (FOBT) of the stool. You may have this test every year starting at age 84. Flexible sigmoidoscopy or colonoscopy. You may have a sigmoidoscopy every 5 years or a colonoscopy every 10 years starting  at age 71. Prostate cancer screening. Recommendations will vary depending on your family history and other risks. Hepatitis C blood test. Hepatitis B blood test. Sexually transmitted disease (STD) testing. Diabetes screening. This is done by checking your blood sugar (glucose) after you have not eaten for a while (fasting). You may have this done every 1-3 years. Abdominal aortic aneurysm (AAA) screening. You may need this if you are a current or former  smoker. Osteoporosis. You may be screened starting at age 75 if you are at high risk. Talk with your health care provider about your test results, treatment options, and if necessary, the need for more tests. Vaccines  Your health care provider may recommend certain vaccines, such as: Influenza vaccine. This is recommended every year. Tetanus, diphtheria, and acellular pertussis (Tdap, Td) vaccine. You may need a Td booster every 10 years. Zoster vaccine. You may need this after age 110. Pneumococcal 13-valent conjugate (PCV13) vaccine. One dose is recommended after age 84. Pneumococcal polysaccharide (PPSV23) vaccine. One dose is recommended after age 84. Talk to your health care provider about which screenings and vaccines you need and how often you need them. This information is not intended to replace advice given to you by your health care provider. Make sure you discuss any questions you have with your health care provider. Document Released: 09/10/2015 Document Revised: 05/03/2016 Document Reviewed: 06/15/2015 Elsevier Interactive Patient Education  2017 [place] Prevention in the Home Falls can cause injuries. They can happen to people of all ages. There are many things you can do to make your home safe and to help prevent falls. What can I do on the outside of my home? Regularly fix the edges of walkways and driveways and fix any cracks. Remove anything that might make you trip as you walk through a door, such as a raised step or threshold. Trim any bushes or trees on the path to your home. Use bright outdoor lighting. Clear any walking paths of anything that might make someone trip, such as rocks or tools. Regularly check to see if handrails are loose or broken. Make sure that both sides of any steps have handrails. Any raised decks and porches should have guardrails on the edges. Have any leaves, snow, or ice cleared regularly. Use sand or salt on walking paths during  winter. Clean up any spills in your garage right away. This includes oil or grease spills. What can I do in the bathroom? Use night lights. Install grab bars by the toilet and in the tub and shower. Do not use towel bars as grab bars. Use non-skid mats or decals in the tub or shower. If you need to sit down in the shower, use a plastic, non-slip stool. Keep the floor dry. Clean up any water that spills on the floor as soon as it happens. Remove soap buildup in the tub or shower regularly. Attach bath mats securely with double-sided non-slip rug tape. Do not have throw rugs and other things on the floor that can make you trip. What can I do in the bedroom? Use night lights. Make sure that you have a light by your bed that is easy to reach. Do not use any sheets or blankets that are too big for your bed. They should not hang down onto the floor. Have a firm chair that has side arms. You can use this for support while you get dressed. Do not have throw rugs and other things on the floor that can  make you trip. What can I do in the kitchen? Clean up any spills right away. Avoid walking on wet floors. Keep items that you use a lot in easy-to-reach places. If you need to reach something above you, use a strong step stool that has a grab bar. Keep electrical cords out of the way. Do not use floor polish or wax that makes floors slippery. If you must use wax, use non-skid floor wax. Do not have throw rugs and other things on the floor that can make you trip. What can I do with my stairs? Do not leave any items on the stairs. Make sure that there are handrails on both sides of the stairs and use them. Fix handrails that are broken or loose. Make sure that handrails are as long as the stairways. Check any carpeting to make sure that it is firmly attached to the stairs. Fix any carpet that is loose or worn. Avoid having throw rugs at the top or bottom of the stairs. If you do have throw rugs,  attach them to the floor with carpet tape. Make sure that you have a light switch at the top of the stairs and the bottom of the stairs. If you do not have them, ask someone to add them for you. What else can I do to help prevent falls? Wear shoes that: Do not have high heels. Have rubber bottoms. Are comfortable and fit you well. Are closed at the toe. Do not wear sandals. If you use a stepladder: Make sure that it is fully opened. Do not climb a closed stepladder. Make sure that both sides of the stepladder are locked into place. Ask someone to hold it for you, if possible. Clearly mark and make sure that you can see: Any grab bars or handrails. First and last steps. Where the edge of each step is. Use tools that help you move around (mobility aids) if they are needed. These include: Canes. Walkers. Scooters. Crutches. Turn on the lights when you go into a dark area. Replace any light bulbs as soon as they burn out. Set up your furniture so you have a clear path. Avoid moving your furniture around. If any of your floors are uneven, fix them. If there are any pets around you, be aware of where they are. Review your medicines with your doctor. Some medicines can make you feel dizzy. This can increase your chance of falling. Ask your doctor what other things that you can do to help prevent falls. This information is not intended to replace advice given to you by your health care provider. Make sure you discuss any questions you have with your health care provider. Document Released: 06/10/2009 Document Revised: 01/20/2016 Document Reviewed: 09/18/2014 Elsevier Interactive Patient Education  2017 [employer].

## 2022-09-22 NOTE — Progress Notes (Signed)
Remote pacemaker transmission.   

## 2022-09-28 DIAGNOSIS — J3089 Other allergic rhinitis: Secondary | ICD-10-CM | POA: Diagnosis not present

## 2022-09-28 DIAGNOSIS — H8110 Benign paroxysmal vertigo, unspecified ear: Secondary | ICD-10-CM | POA: Diagnosis not present

## 2022-10-04 ENCOUNTER — Ambulatory Visit: Payer: Medicare PPO | Admitting: Pulmonary Disease

## 2022-10-04 ENCOUNTER — Encounter: Payer: Self-pay | Admitting: Pulmonary Disease

## 2022-10-04 VITALS — BP 128/72 | HR 78 | Ht 72.0 in | Wt 181.6 lb

## 2022-10-04 DIAGNOSIS — J849 Interstitial pulmonary disease, unspecified: Secondary | ICD-10-CM

## 2022-10-04 NOTE — Patient Instructions (Signed)
  X PFTs & HRCT chest in  3 months

## 2022-10-04 NOTE — Assessment & Plan Note (Signed)
His disease appears to be clinically stable.  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  There has been some worsening.  Unfortunately could not tolerate Ofev and does not want to start on alternative antifibrotic. We again had a risk-benefit discussion today. We will reassess with annual PFTs and high-resolution CT chest and bring him back to visit in 3 months
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
## 2022-10-24 DIAGNOSIS — J309 Allergic rhinitis, unspecified: Secondary | ICD-10-CM | POA: Diagnosis not present

## 2022-10-24 DIAGNOSIS — J019 Acute sinusitis, unspecified: Secondary | ICD-10-CM | POA: Diagnosis not present

## 2022-10-24 DIAGNOSIS — J069 Acute upper respiratory infection, unspecified: Secondary | ICD-10-CM | POA: Diagnosis not present

## 2022-11-29 ENCOUNTER — Ambulatory Visit (INDEPENDENT_AMBULATORY_CARE_PROVIDER_SITE_OTHER): Payer: Medicare PPO

## 2022-11-29 DIAGNOSIS — I442 Atrioventricular block, complete: Secondary | ICD-10-CM | POA: Diagnosis not present

## 2022-11-29 LAB — CUP PACEART REMOTE DEVICE CHECK
Battery Remaining Longevity: 38 mo
Battery Remaining Percentage: 29 %
Battery Voltage: 2.95 V
Brady Statistic AP VP Percent: 1 %
Brady Statistic AP VS Percent: 1.3 %
Brady Statistic AS VP Percent: 1 %
Brady Statistic AS VS Percent: 99 %
Brady Statistic RA Percent Paced: 1.2 %
Brady Statistic RV Percent Paced: 1 %
Date Time Interrogation Session: 20240403020015
Implantable Lead Connection Status: 753985
Implantable Lead Connection Status: 753985
Implantable Lead Implant Date: 20150501
Implantable Lead Implant Date: 20150501
Implantable Lead Location: 753859
Implantable Lead Location: 753860
Implantable Lead Model: 1948
Implantable Pulse Generator Implant Date: 20150501
Lead Channel Impedance Value: 400 Ohm
Lead Channel Impedance Value: 590 Ohm
Lead Channel Pacing Threshold Amplitude: 0.5 V
Lead Channel Pacing Threshold Amplitude: 0.625 V
Lead Channel Pacing Threshold Pulse Width: 0.5 ms
Lead Channel Pacing Threshold Pulse Width: 0.5 ms
Lead Channel Sensing Intrinsic Amplitude: 11.7 mV
Lead Channel Sensing Intrinsic Amplitude: 5 mV
Lead Channel Setting Pacing Amplitude: 0.875
Lead Channel Setting Pacing Amplitude: 1.5 V
Lead Channel Setting Pacing Pulse Width: 0.5 ms
Lead Channel Setting Sensing Sensitivity: 2 mV
Pulse Gen Model: 2240
Pulse Gen Serial Number: 7618665

## 2022-12-27 DIAGNOSIS — N35013 Post-traumatic anterior urethral stricture: Secondary | ICD-10-CM | POA: Diagnosis not present

## 2022-12-27 DIAGNOSIS — R350 Frequency of micturition: Secondary | ICD-10-CM | POA: Diagnosis not present

## 2022-12-27 DIAGNOSIS — N401 Enlarged prostate with lower urinary tract symptoms: Secondary | ICD-10-CM | POA: Diagnosis not present

## 2022-12-29 ENCOUNTER — Ambulatory Visit: Payer: Medicare PPO | Attending: Internal Medicine | Admitting: Cardiovascular Disease

## 2022-12-29 ENCOUNTER — Encounter: Payer: Medicare PPO | Admitting: Internal Medicine

## 2022-12-29 ENCOUNTER — Encounter: Payer: Self-pay | Admitting: Cardiovascular Disease

## 2022-12-29 VITALS — BP 126/58 | HR 72 | Ht 72.0 in | Wt 189.4 lb

## 2022-12-29 DIAGNOSIS — I441 Atrioventricular block, second degree: Secondary | ICD-10-CM

## 2022-12-29 LAB — CUP PACEART INCLINIC DEVICE CHECK
Battery Remaining Longevity: 39 mo
Battery Voltage: 2.95 V
Brady Statistic RA Percent Paced: 1.1 %
Brady Statistic RV Percent Paced: 0.07 %
Date Time Interrogation Session: 20240503085201
Implantable Lead Connection Status: 753985
Implantable Lead Connection Status: 753985
Implantable Lead Implant Date: 20150501
Implantable Lead Implant Date: 20150501
Implantable Lead Location: 753859
Implantable Lead Location: 753860
Implantable Lead Model: 1948
Implantable Pulse Generator Implant Date: 20150501
Lead Channel Impedance Value: 387.5 Ohm
Lead Channel Impedance Value: 587.5 Ohm
Lead Channel Pacing Threshold Amplitude: 0.5 V
Lead Channel Pacing Threshold Amplitude: 0.5 V
Lead Channel Pacing Threshold Amplitude: 0.75 V
Lead Channel Pacing Threshold Amplitude: 0.75 V
Lead Channel Pacing Threshold Pulse Width: 0.5 ms
Lead Channel Pacing Threshold Pulse Width: 0.5 ms
Lead Channel Pacing Threshold Pulse Width: 0.5 ms
Lead Channel Pacing Threshold Pulse Width: 0.5 ms
Lead Channel Sensing Intrinsic Amplitude: 12 mV
Lead Channel Sensing Intrinsic Amplitude: 5 mV
Lead Channel Setting Pacing Amplitude: 0.875
Lead Channel Setting Pacing Amplitude: 1.5 V
Lead Channel Setting Pacing Pulse Width: 0.5 ms
Lead Channel Setting Sensing Sensitivity: 2 mV
Pulse Gen Model: 2240
Pulse Gen Serial Number: 7618665
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
## 2022-12-29 NOTE — Patient Instructions (Signed)
Medication Instructions:  Continue all current medications.  Labwork: none  Testing/Procedures: none  Follow-Up: 1 year - Dr.  Mealor   Any Other Special Instructions Will Be Listed Below (If Applicable).   If you need a refill on your cardiac medications before your next appointment, please call your pharmacy.; 

## 2023-01-03 ENCOUNTER — Ambulatory Visit (HOSPITAL_COMMUNITY)
Admission: RE | Admit: 2023-01-03 | Discharge: 2023-01-03 | Disposition: A | Discharge status: Home / Self Care | Payer: Medicare PPO | Source: Ambulatory Visit | Attending: Pulmonary Disease | Admitting: Pulmonary Disease

## 2023-01-03 DIAGNOSIS — J849 Interstitial pulmonary disease, unspecified: Secondary | ICD-10-CM | POA: Insufficient documentation

## 2023-01-03 DIAGNOSIS — I700 Atherosclerosis of aorta: Secondary | ICD-10-CM | POA: Diagnosis not present

## 2023-01-03 NOTE — Progress Notes (Signed)
Remote pacemaker transmission.   

## 2023-01-09 ENCOUNTER — Ambulatory Visit: Payer: Medicare PPO | Admitting: Pulmonary Disease

## 2023-01-11 ENCOUNTER — Ambulatory Visit: Payer: Medicare PPO | Admitting: Pulmonary Disease

## 2023-01-11 ENCOUNTER — Encounter: Payer: Self-pay | Admitting: Pulmonary Disease

## 2023-01-11 VITALS — BP 110/57 | HR 79 | Ht 72.0 in | Wt 186.2 lb

## 2023-01-11 DIAGNOSIS — J849 Interstitial pulmonary disease, unspecified: Secondary | ICD-10-CM | POA: Diagnosis not present
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
## 2023-01-11 NOTE — Patient Instructions (Signed)
X Ambulatory sat 

## 2023-01-11 NOTE — Assessment & Plan Note (Signed)
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  He does not want aggressive testing.  He does not want to consider antifibrotic such as pirfenidone.  "What is the use at my age?" We will follow him with annual CT scans and ambulatory oximetry

## 2023-01-30 ENCOUNTER — Ambulatory Visit (HOSPITAL_COMMUNITY): Payer: Medicare PPO

## 2023-02-28 ENCOUNTER — Ambulatory Visit (INDEPENDENT_AMBULATORY_CARE_PROVIDER_SITE_OTHER): Payer: Medicare PPO

## 2023-02-28 DIAGNOSIS — I441 Atrioventricular block, second degree: Secondary | ICD-10-CM

## 2023-02-28 LAB — CUP PACEART REMOTE DEVICE CHECK
Battery Remaining Longevity: 35 mo
Battery Remaining Percentage: 27 %
Battery Voltage: 2.95 V
Brady Statistic AP VP Percent: 1 %
Brady Statistic AP VS Percent: 1 %
Brady Statistic AS VP Percent: 1 %
Brady Statistic AS VS Percent: 99 %
Brady Statistic RA Percent Paced: 1 %
Brady Statistic RV Percent Paced: 1 %
Date Time Interrogation Session: 20240703025404
Implantable Lead Connection Status: 753985
Implantable Lead Connection Status: 753985
Implantable Lead Implant Date: 20150501
Implantable Lead Implant Date: 20150501
Implantable Lead Location: 753859
Implantable Lead Location: 753860
Implantable Lead Model: 1948
Implantable Pulse Generator Implant Date: 20150501
Lead Channel Impedance Value: 380 Ohm
Lead Channel Impedance Value: 550 Ohm
Lead Channel Pacing Threshold Amplitude: 0.5 V
Lead Channel Pacing Threshold Amplitude: 0.625 V
Lead Channel Pacing Threshold Pulse Width: 0.5 ms
Lead Channel Pacing Threshold Pulse Width: 0.5 ms
Lead Channel Sensing Intrinsic Amplitude: 4.8 mV
Lead Channel Sensing Intrinsic Amplitude: 9.5 mV
Lead Channel Setting Pacing Amplitude: 0.875
Lead Channel Setting Pacing Amplitude: 1.5 V
Lead Channel Setting Pacing Pulse Width: 0.5 ms
Lead Channel Setting Sensing Sensitivity: 2 mV
Pulse Gen Model: 2240
Pulse Gen Serial Number: 7618665

## 2023-03-22 NOTE — Progress Notes (Signed)
Remote pacemaker transmission.   

## 2023-05-09 DIAGNOSIS — U071 COVID-19: Secondary | ICD-10-CM | POA: Diagnosis not present

## 2023-05-09 DIAGNOSIS — R051 Acute cough: Secondary | ICD-10-CM | POA: Diagnosis not present

## 2023-05-23 ENCOUNTER — Telehealth: Payer: Self-pay | Admitting: Family Medicine

## 2023-05-23 NOTE — Telephone Encounter (Signed)
Received call from [employer]'s 800 Service number to verify patient's chronic illness. Please advise at [phone].

## 2023-05-30 ENCOUNTER — Ambulatory Visit (INDEPENDENT_AMBULATORY_CARE_PROVIDER_SITE_OTHER): Payer: Medicare HMO

## 2023-05-30 DIAGNOSIS — I441 Atrioventricular block, second degree: Secondary | ICD-10-CM | POA: Diagnosis not present

## 2023-05-30 LAB — CUP PACEART REMOTE DEVICE CHECK
Battery Remaining Longevity: 32 mo
Battery Remaining Percentage: 24 %
Battery Voltage: 2.93 V
Brady Statistic AP VP Percent: 1 %
Brady Statistic AP VS Percent: 1.5 %
Brady Statistic AS VP Percent: 1 %
Brady Statistic AS VS Percent: 98 %
Brady Statistic RA Percent Paced: 1.4 %
Brady Statistic RV Percent Paced: 1 %
Date Time Interrogation Session: 20241002020013
Implantable Lead Connection Status: 753985
Implantable Lead Connection Status: 753985
Implantable Lead Implant Date: 20150501
Implantable Lead Implant Date: 20150501
Implantable Lead Location: 753859
Implantable Lead Location: 753860
Implantable Lead Model: 1948
Implantable Pulse Generator Implant Date: 20150501
Lead Channel Impedance Value: 380 Ohm
Lead Channel Impedance Value: 560 Ohm
Lead Channel Pacing Threshold Amplitude: 0.5 V
Lead Channel Pacing Threshold Amplitude: 0.5 V
Lead Channel Pacing Threshold Pulse Width: 0.5 ms
Lead Channel Pacing Threshold Pulse Width: 0.5 ms
Lead Channel Sensing Intrinsic Amplitude: 10.8 mV
Lead Channel Sensing Intrinsic Amplitude: 5 mV
Lead Channel Setting Pacing Amplitude: 0.75 V
Lead Channel Setting Pacing Amplitude: 1.5 V
Lead Channel Setting Pacing Pulse Width: 0.5 ms
Lead Channel Setting Sensing Sensitivity: 2 mV
Pulse Gen Model: 2240
Pulse Gen Serial Number: 7618665

## 2023-06-04 DIAGNOSIS — L820 Inflamed seborrheic keratosis: Secondary | ICD-10-CM | POA: Diagnosis not present

## 2023-06-04 DIAGNOSIS — L578 Other skin changes due to chronic exposure to nonionizing radiation: Secondary | ICD-10-CM | POA: Diagnosis not present

## 2023-06-04 DIAGNOSIS — L905 Scar conditions and fibrosis of skin: Secondary | ICD-10-CM | POA: Diagnosis not present

## 2023-06-04 DIAGNOSIS — D225 Melanocytic nevi of trunk: Secondary | ICD-10-CM | POA: Diagnosis not present

## 2023-06-04 DIAGNOSIS — L821 Other seborrheic keratosis: Secondary | ICD-10-CM | POA: Diagnosis not present

## 2023-06-04 DIAGNOSIS — L570 Actinic keratosis: Secondary | ICD-10-CM | POA: Diagnosis not present

## 2023-06-15 NOTE — Progress Notes (Signed)
Remote pacemaker transmission.   

## 2023-07-10 DIAGNOSIS — R351 Nocturia: Secondary | ICD-10-CM | POA: Diagnosis not present

## 2023-07-10 DIAGNOSIS — N210 Calculus in bladder: Secondary | ICD-10-CM | POA: Diagnosis not present

## 2023-07-10 DIAGNOSIS — N401 Enlarged prostate with lower urinary tract symptoms: Secondary | ICD-10-CM | POA: Diagnosis not present

## 2023-07-19 ENCOUNTER — Ambulatory Visit (HOSPITAL_BASED_OUTPATIENT_CLINIC_OR_DEPARTMENT_OTHER): Payer: Medicare HMO | Admitting: Pulmonary Disease

## 2023-07-20 ENCOUNTER — Ambulatory Visit (HOSPITAL_BASED_OUTPATIENT_CLINIC_OR_DEPARTMENT_OTHER): Payer: Medicare HMO | Admitting: Pulmonary Disease

## 2023-08-06 DIAGNOSIS — N35013 Post-traumatic anterior urethral stricture: Secondary | ICD-10-CM | POA: Diagnosis not present

## 2023-08-06 DIAGNOSIS — N401 Enlarged prostate with lower urinary tract symptoms: Secondary | ICD-10-CM | POA: Diagnosis not present

## 2023-08-06 DIAGNOSIS — R3914 Feeling of incomplete bladder emptying: Secondary | ICD-10-CM | POA: Diagnosis not present

## 2023-08-06 DIAGNOSIS — R3912 Poor urinary stream: Secondary | ICD-10-CM | POA: Diagnosis not present

## 2023-08-13 ENCOUNTER — Ambulatory Visit (HOSPITAL_BASED_OUTPATIENT_CLINIC_OR_DEPARTMENT_OTHER): Payer: Medicare HMO | Admitting: Pulmonary Disease

## 2023-08-24 DIAGNOSIS — R3914 Feeling of incomplete bladder emptying: Secondary | ICD-10-CM | POA: Diagnosis not present

## 2023-08-30 ENCOUNTER — Ambulatory Visit (INDEPENDENT_AMBULATORY_CARE_PROVIDER_SITE_OTHER): Payer: Medicare HMO

## 2023-08-30 DIAGNOSIS — I441 Atrioventricular block, second degree: Secondary | ICD-10-CM

## 2023-08-30 LAB — CUP PACEART REMOTE DEVICE CHECK
Battery Remaining Longevity: 29 mo
Battery Remaining Percentage: 22 %
Battery Voltage: 2.92 V
Brady Statistic AP VP Percent: 1 %
Brady Statistic AP VS Percent: 1.5 %
Brady Statistic AS VP Percent: 1 %
Brady Statistic AS VS Percent: 98 %
Brady Statistic RA Percent Paced: 1.4 %
Brady Statistic RV Percent Paced: 1 %
Date Time Interrogation Session: 20250101031653
Implantable Lead Connection Status: 753985
Implantable Lead Connection Status: 753985
Implantable Lead Implant Date: 20150501
Implantable Lead Implant Date: 20150501
Implantable Lead Location: 753859
Implantable Lead Location: 753860
Implantable Lead Model: 1948
Implantable Pulse Generator Implant Date: 20150501
Lead Channel Impedance Value: 380 Ohm
Lead Channel Impedance Value: 550 Ohm
Lead Channel Pacing Threshold Amplitude: 0.375 V
Lead Channel Pacing Threshold Amplitude: 0.5 V
Lead Channel Pacing Threshold Pulse Width: 0.5 ms
Lead Channel Pacing Threshold Pulse Width: 0.5 ms
Lead Channel Sensing Intrinsic Amplitude: 5 mV
Lead Channel Sensing Intrinsic Amplitude: 8.6 mV
Lead Channel Setting Pacing Amplitude: 0.625
Lead Channel Setting Pacing Amplitude: 1.5 V
Lead Channel Setting Pacing Pulse Width: 0.5 ms
Lead Channel Setting Sensing Sensitivity: 2 mV
Pulse Gen Model: 2240
Pulse Gen Serial Number: 7618665

## 2023-09-06 ENCOUNTER — Ambulatory Visit (INDEPENDENT_AMBULATORY_CARE_PROVIDER_SITE_OTHER): Payer: Medicare HMO | Admitting: *Deleted

## 2023-09-06 DIAGNOSIS — Z0000 Encounter for general adult medical examination without abnormal findings: Secondary | ICD-10-CM | POA: Diagnosis not present

## 2023-09-06 DIAGNOSIS — E118 Type 2 diabetes mellitus with unspecified complications: Secondary | ICD-10-CM | POA: Diagnosis not present
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
## 2023-09-06 NOTE — Patient Instructions (Signed)
 [name] , Thank you for taking time to come for your Medicare Wellness Visit. I appreciate your ongoing commitment to your health goals. Please review the following plan we discussed and let me know if I can assist you in the future.   Screening recommendations/referrals: Colonoscopy: no longer required Recommended yearly ophthalmology/optometry visit for glaucoma screening and checkup Recommended yearly dental visit for hygiene and checkup  Vaccinations: Influenza vaccine: up to date Pneumococcal vaccine: up to date Tdap vaccine: Allergic Shingles vaccine: up to date    Advanced directives: Education provided    Preventive Care 65 Years and Older, Male Preventive care refers to lifestyle choices and visits with your health care provider that can promote health and wellness. What does preventive care include? A yearly physical exam. This is also called an annual well check. Dental exams once or twice a year. Routine eye exams. Ask your health care provider how often you should have your eyes checked. Personal lifestyle choices, including: Daily care of your teeth and gums. Regular physical activity. Eating a healthy diet. Avoiding tobacco and drug use. Limiting alcohol use. Practicing safe sex. Taking low doses of aspirin every day. Taking vitamin and mineral supplements as recommended by your health care provider. What happens during an annual well check? The services and screenings done by your health care provider during your annual well check will depend on your age, overall health, lifestyle risk factors, and family history of disease. Counseling  Your health care provider may ask you questions about your: Alcohol use. Tobacco use. Drug use. Emotional well-being. Home and relationship well-being. Sexual activity. Eating habits. History of falls. Memory and ability to understand (cognition). Work and work astronomer. Screening  You may have the following tests  or measurements: Height, weight, and BMI. Blood pressure. Lipid and cholesterol levels. These may be checked every 5 years, or more frequently if you are over 52 years old. Skin check. Lung cancer screening. You may have this screening every year starting at age 36 if you have a 30-pack-year history of smoking and currently smoke or have quit within the past 15 years. Fecal occult blood test (FOBT) of the stool. You may have this test every year starting at age 75. Flexible sigmoidoscopy or colonoscopy. You may have a sigmoidoscopy every 5 years or a colonoscopy every 10 years starting at age 55. Prostate cancer screening. Recommendations will vary depending on your family history and other risks. Hepatitis C blood test. Hepatitis B blood test. Sexually transmitted disease (STD) testing. Diabetes screening. This is done by checking your blood sugar (glucose) after you have not eaten for a while (fasting). You may have this done every 1-3 years. Abdominal aortic aneurysm (AAA) screening. You may need this if you are a current or former smoker. Osteoporosis. You may be screened starting at age 33 if you are at high risk. Talk with your health care provider about your test results, treatment options, and if necessary, the need for more tests. Vaccines  Your health care provider may recommend certain vaccines, such as: Influenza vaccine. This is recommended every year. Tetanus, diphtheria, and acellular pertussis (Tdap, Td) vaccine. You may need a Td booster every 10 years. Zoster vaccine. You may need this after age 2. Pneumococcal 13-valent conjugate (PCV13) vaccine. One dose is recommended after age 40. Pneumococcal polysaccharide (PPSV23) vaccine. One dose is recommended after age 84. Talk to your health care provider about which screenings and vaccines you need and how often you need them. This information  is not intended to replace advice given to you by your health care provider. Make  sure you discuss any questions you have with your health care provider. Document Released: 09/10/2015 Document Revised: 05/03/2016 Document Reviewed: 06/15/2015 Elsevier Interactive Patient Education  2017 [employer].  Fall Prevention in the Home Falls can cause injuries. They can happen to people of all ages. There are many things you can do to make your home safe and to help prevent falls. What can I do on the outside of my home? Regularly fix the edges of walkways and driveways and fix any cracks. Remove anything that might make you trip as you walk through a door, such as a raised step or threshold. Trim any bushes or trees on the path to your home. Use bright outdoor lighting. Clear any walking paths of anything that might make someone trip, such as rocks or tools. Regularly check to see if handrails are loose or broken. Make sure that both sides of any steps have handrails. Any raised decks and porches should have guardrails on the edges. Have any leaves, snow, or ice cleared regularly. Use sand or salt on walking paths during winter. Clean up any spills in your garage right away. This includes oil or grease spills. What can I do in the bathroom? Use night lights. Install grab bars by the toilet and in the tub and shower. Do not use towel bars as grab bars. Use non-skid mats or decals in the tub or shower. If you need to sit down in the shower, use a plastic, non-slip stool. Keep the floor dry. Clean up any water  that spills on the floor as soon as it happens. Remove soap buildup in the tub or shower regularly. Attach bath mats securely with double-sided non-slip rug tape. Do not have throw rugs and other things on the floor that can make you trip. What can I do in the bedroom? Use night lights. Make sure that you have a light by your bed that is easy to reach. Do not use any sheets or blankets that are too big for your bed. They should not hang down onto the floor. Have a firm  chair that has side arms. You can use this for support while you get dressed. Do not have throw rugs and other things on the floor that can make you trip. What can I do in the kitchen? Clean up any spills right away. Avoid walking on wet floors. Keep items that you use a lot in easy-to-reach places. If you need to reach something above you, use a strong step stool that has a grab bar. Keep electrical cords out of the way. Do not use floor polish or wax that makes floors slippery. If you must use wax, use non-skid floor wax. Do not have throw rugs and other things on the floor that can make you trip. What can I do with my stairs? Do not leave any items on the stairs. Make sure that there are handrails on both sides of the stairs and use them. Fix handrails that are broken or loose. Make sure that handrails are as long as the stairways. Check any carpeting to make sure that it is firmly attached to the stairs. Fix any carpet that is loose or worn. Avoid having throw rugs at the top or bottom of the stairs. If you do have throw rugs, attach them to the floor with carpet tape. Make sure that you have a light switch at the top of  the stairs and the bottom of the stairs. If you do not have them, ask someone to add them for you. What else can I do to help prevent falls? Wear shoes that: Do not have high heels. Have rubber bottoms. Are comfortable and fit you well. Are closed at the toe. Do not wear sandals. If you use a stepladder: Make sure that it is fully opened. Do not climb a closed stepladder. Make sure that both sides of the stepladder are locked into place. Ask someone to hold it for you, if possible. Clearly mark and make sure that you can see: Any grab bars or handrails. First and last steps. Where the edge of each step is. Use tools that help you move around (mobility aids) if they are needed. These include: Canes. Walkers. Scooters. Crutches. Turn on the lights when you go  into a dark area. Replace any light bulbs as soon as they burn out. Set up your furniture so you have a clear path. Avoid moving your furniture around. If any of your floors are uneven, fix them. If there are any pets around you, be aware of where they are. Review your medicines with your doctor. Some medicines can make you feel dizzy. This can increase your chance of falling. Ask your doctor what other things that you can do to help prevent falls. This information is not intended to replace advice given to you by your health care provider. Make sure you discuss any questions you have with your health care provider. Document Released: 06/10/2009 Document Revised: 01/20/2016 Document Reviewed: 09/18/2014 Elsevier Interactive Patient Education  2017 [employer].

## 2023-09-13 ENCOUNTER — Other Ambulatory Visit: Payer: Self-pay | Admitting: Urology

## 2023-09-13 ENCOUNTER — Telehealth: Payer: Self-pay | Admitting: Cardiovascular Disease

## 2023-09-13 NOTE — Telephone Encounter (Signed)
   Pre-operative Risk Assessment    Patient Name: Edgar Evans  DOB: 1941-02-03 MRN: [id number]   Date of last office visit: 12/29/22 Date of next office visit: 12/28/23   Request for Surgical Clearance    Procedure:   Aquablation of the prostate  Date of Surgery:  Clearance 10/04/23                                Surgeon:  Dr. Vilma Prader Surgeon's Group or Practice Name:  [facility] Phone number:  [phone]  Fax number:  [phone]   Type of Clearance Requested:   - Medical    Type of Anesthesia:  General    Additional requests/questions:   Caller stated patient needs medical clearance.  Signed, Annetta Maw   09/13/2023, 10:30 AM

## 2023-09-13 NOTE — Telephone Encounter (Signed)
Called and spoke to patient and scheduled appointment for surgical clearance on 1/29

## 2023-09-13 NOTE — Telephone Encounter (Signed)
   Name: Edgar Evans  DOB: 10-Aug-1941  MRN: [id number]  Primary Cardiologist: None  Chart reviewed as part of pre-operative protocol coverage. Because of Edgar Evans's past medical history and time since last visit, he will require a follow-up in-office visit in order to better assess preoperative cardiovascular risk. Pt resides in [place], follows with EP only.   Pre-op covering staff: - Please schedule appointment and call patient to inform them. If patient already had an upcoming appointment within acceptable timeframe, please add "pre-op clearance" to the appointment notes so provider is aware. - Please contact requesting surgeon's office via preferred method (i.e, phone, fax) to inform them of need for appointment prior to surgery.  Joylene Grapes, NP  09/13/2023, 12:00 PM

## 2023-09-20 ENCOUNTER — Telehealth: Payer: Self-pay | Admitting: Cardiovascular Disease

## 2023-09-20 NOTE — Telephone Encounter (Signed)
Please have patient send manual transmission.

## 2023-09-20 NOTE — Telephone Encounter (Signed)
Attempted to contact patient. No answer,LMTCB 

## 2023-09-20 NOTE — Telephone Encounter (Signed)
Pt called EMS last night cause he had 3 sharpe chest pains. EMS did a EKG on him and it was fine. They told him they thought it was his pacemaker shocking him. Please advise

## 2023-09-21 NOTE — Telephone Encounter (Signed)
LMTCB. Pt has PPM. Unable to shock him. No transmission but in [email or web address] there is no DirectAlerts as of 09/20/23.

## 2023-09-22 NOTE — Patient Instructions (Addendum)
SURGICAL WAITING ROOM VISITATION Patients having surgery or a procedure may have no more than 2 support people in the waiting area - these visitors may rotate in the visitor waiting room.   Due to an increase in RSV and influenza rates and associated hospitalizations, children ages 33 and under may not visit patients in [facility] hospitals. If the patient needs to stay at the hospital during part of their recovery, the visitor guidelines for inpatient rooms apply.  PRE-OP VISITATION  Pre-op nurse will coordinate an appropriate time for 1 support person to accompany the patient in pre-op.  This support person may not rotate.  This visitor will be contacted when the time is appropriate for the visitor to come back in the pre-op area.  Please refer to the [facility] website for the visitor guidelines for Inpatients (after your surgery is over and you are in a regular room).  You are not required to quarantine at this time prior to your surgery. However, you must do this: Hand Hygiene often Do NOT share personal items Notify your provider if you are in close contact with someone who has COVID or you develop fever 100.4 or greater, new onset of sneezing, cough, sore throat, shortness of breath or body aches.  If you test positive for Covid or have been in contact with anyone that has tested positive in the last 10 days please notify you surgeon.    Your procedure is scheduled on:  Thursday  October 04, 2023  Report to [facility] Main Entrance: Le[address]sen entrance where the [employer] is available.   Report to admitting at: 06:45    AM  Call this number if you have any questions or problems the morning of surgery [phone]  DO NOT EAT OR DRINK ANYTHING AFTER MIDNIGHT THE NIGHT PRIOR TO YOUR SURGERY / PROCEDURE.   FOLLOW  ANY ADDITIONAL PRE OP INSTRUCTIONS YOU RECEIVED FROM YOUR SURGEON'S OFFICE!!!   Oral Hygiene is also important to reduce your risk of infection.         Remember - BRUSH YOUR TEETH THE MORNING OF SURGERY WITH YOUR REGULAR TOOTHPASTE  Do NOT smoke after Midnight the night before surgery.  Diabetic medications/ instructions:  Sitagliptin (Januvia)  DO NOT take the morning of your surgery. Metformin  DO NOT take the morning of your surgery  STOP TAKING all Vitamins, Herbs and supplements 1 week before your surgery.   Take ONLY these medicines the morning of surgery with A SIP OF WATER: Finasteride.                    You may not have any metal on your body including  jewelry, and body piercing  Do not wear lotions, powders, cologne, or deodorant  Men may shave face and neck.  Contacts, Hearing Aids, dentures or bridgework may not be worn into surgery. DENTURES WILL BE REMOVED PRIOR TO SURGERY PLEASE DO NOT APPLY "Poly grip" OR ADHESIVES!!!  You may bring a small overnight bag with you on the day of surgery, only pack items that are not valuable. [facility] IS NOT RESPONSIBLE   FOR VALUABLES THAT ARE LOST OR STOLEN.   Do not bring your home medications to the hospital. The Pharmacy will dispense medications listed on your medication list to you during your admission in the Hospital.  Please read over the following fact sheets you were given: IF YOU HAVE QUESTIONS ABOUT YOUR PRE-OP INSTRUCTIONS, PLEASE CALL [phone].    -  Preparing for Surgery Before surgery, you can play an important role.  Because skin is not sterile, your skin needs to be as free of germs as possible.  You can reduce the number of germs on your skin by washing with CHG (chlorahexidine gluconate) soap before surgery.  CHG is an antiseptic cleaner which kills germs and bonds with the skin to continue killing germs even after washing. Please DO NOT use if you have an allergy to CHG or antibacterial soaps.  If your skin becomes reddened/irritated stop using the CHG and inform your nurse when you arrive at Short Stay. Do not shave (including legs and  underarms) for at least 48 hours prior to the first CHG shower.  You may shave your face/neck.  Please follow these instructions carefully:  1.  Shower with CHG Soap the night before surgery and the  morning of surgery.  2.  If you choose to wash your hair, wash your hair first as usual with your normal  shampoo.  3.  After you shampoo, rinse your hair and body thoroughly to remove the shampoo.                             4.  Use CHG as you would any other liquid soap.  You can apply chg directly to the skin and wash.  Gently with a scrungie or clean washcloth.  5.  Apply the CHG Soap to your body ONLY FROM THE NECK DOWN.   Do not use on face/ open                           Wound or open sores. Avoid contact with eyes, ears mouth and genitals (private parts).                       Wash face,  Genitals (private parts) with your normal soap.             6.  Wash thoroughly, paying special attention to the area where your  surgery  will be performed.  7.  Thoroughly rinse your body with warm water from the neck down.  8.  DO NOT shower/wash with your normal soap after using and rinsing off the CHG Soap.            9.  Pat yourself dry with a clean towel.            10.  Wear clean pajamas.            11.  Place clean sheets on your bed the night of your first shower and do not  sleep with pets.  ON THE DAY OF SURGERY : Do not apply any lotions/deodorants the morning of surgery.  Please wear clean clothes to the hospital/surgery center.    FAILURE TO FOLLOW THESE INSTRUCTIONS MAY RESULT IN THE CANCELLATION OF YOUR SURGERY  PATIENT SIGNATURE_________________________________  NURSE SIGNATURE__________________________________  ________________________________________________________________________

## 2023-09-22 NOTE — Progress Notes (Signed)
COVID Vaccine received:  []  No [x]  Yes Date of any COVID positive Test in last 90 days:  PCP - Lynnea Ferrier, MD  Cardiologist / EP - York Pellant, MD  Pulmonology- Cyril Mourning, MD   Chest x-ray - 11-10-2019  2v  Epic  CT Chest 01-03-2023  Epic EKG -  01-06-2022  Epic   will repeat at PST Stress Test -  ECHO -  Cardiac Cath -   PCR screen: []  Ordered & Completed []   No Order but Needs PROFEND     [x]   N/A for this surgery  Surgery Plan:  []  Ambulatory   [x]  Outpatient in bed  []  Admit Anesthesia:    [x]  General  []  Spinal  []   Choice []   MAC  Bowel Prep - [x]  No  []   Yes ______  Pacemaker / ICD device []  No [x]  Yes  St. Jude Medical 2240 Assurity Dual chamber  last checked 08-29-2023 Spinal Cord Stimulator:[x]  No []  Yes       History of Sleep Apnea? [x]  No []  Yes   CPAP used?- [x]  No []  Yes    Does the patient monitor blood sugar?   []  N/A   []  No []  Yes  Patient has: []  NO Hx DM   []  Pre-DM   []  DM1  [x]   DM2 Last A1c was:6.6 on 11-16-2022 at the [facility]     Does patient have a Freestyle Cesar Chavez or Dexacom? []  No []  Yes   Fasting Blood Sugar Ranges-  Checks Blood Sugar _____ times a day Diabetic medications/ instructions:  Sitagliptin (Januvia)  Hold DOS Metformin  Hold DOS   Blood Thinner / Instructions: none Aspirin Instructions:  none  ERAS Protocol Ordered: [x]  No  []  Yes Patient is to be NPO after: midnight prior  Dental hx: []  Dentures:  []  N/A      []  Bridge or Partial:                   []  Loose or Damaged teeth:   Comments:   Activity level: Patient is able / unable to climb a flight of stairs without difficulty; []  No CP  []  No SOB, but would have ___   Patient can / can not perform ADLs without assistance.   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  Patient verbalized understanding and agreement to the Pre-Surgical Instructions that  were given to them at this PAT appointment. Patient was also educated of the need to review these PAT instructions again prior to his surgery.I reviewed the appropriate phone numbers to call if they have any and questions or concerns.

## 2023-09-23 NOTE — Progress Notes (Deleted)
  Cardiology Office Note:  .   Date:  09/23/2023  ID:  Edgar Evans, DOB 1941/05/19, MRN [id number] PCP: Donita Brooks, MD  [facility] Providers Cardiologist:  None Electrophysiologist:  Maurice Small, MD { Click to update primary MD,subspecialty MD or APP then REFRESH:1}   History of Present Illness: .   Edgar Evans is a 83 y.o. male with history of pacemaker, HTN,HLD.  Last seen by Dr. Nelly Laurence 12/2022 and doing well. Device check 08/2023 normal function.  Patient here for preop clearance.  ROS: ***  Studies Reviewed: Marland Kitchen         Prior CV Studies: {Select studies to display:26339}  ***  Risk Assessment/Calculations:   {Does this patient have ATRIAL FIBRILLATION?:[phone]} No BP recorded.  {Refresh Note OR Click here to enter BP  :1}***       Physical Exam:   VS:  There were no vitals taken for this visit.   Wt Readings from Last 3 Encounters:  01/11/23 186 lb 3.2 oz (84.5 kg)  12/29/22 189 lb 6.4 oz (85.9 kg)  10/04/22 181 lb 9.6 oz (82.4 kg)    GEN: Well nourished, well developed in no acute distress NECK: No JVD; No carotid bruits CARDIAC: ***RRR, no murmurs, rubs, gallops RESPIRATORY:  Clear to auscultation without rales, wheezing or rhonchi  ABDOMEN: Soft, non-tender, non-distended EXTREMITIES:  No edema; No deformity   ASSESSMENT AND PLAN: .     Preop for Auqablation by Dr. Vilma Prader 10/03/22  History of AV block s/p pacemaker followed by Dr. Nelly Laurence  HTN  HLD     {Are you ordering a CV Procedure (e.g. stress test, cath, DCCV, TEE, etc)?   Press F2        :[id number]}  Dispo: ***  Signed, Jacolyn Reedy, PA-C

## 2023-09-24 ENCOUNTER — Encounter (HOSPITAL_COMMUNITY)
Admission: RE | Admit: 2023-09-24 | Discharge: 2023-09-24 | Disposition: A | Discharge status: Home / Self Care | Payer: Medicare HMO | Source: Ambulatory Visit | Attending: Urology | Admitting: Urology

## 2023-09-24 ENCOUNTER — Other Ambulatory Visit: Payer: Self-pay

## 2023-09-24 ENCOUNTER — Encounter (HOSPITAL_COMMUNITY): Payer: Self-pay

## 2023-09-24 ENCOUNTER — Encounter: Payer: Self-pay | Admitting: Cardiovascular Disease

## 2023-09-24 VITALS — BP 134/74 | HR 74 | Temp 97.8°F | Resp 16 | Ht 72.0 in | Wt 180.0 lb

## 2023-09-24 DIAGNOSIS — E119 Type 2 diabetes mellitus without complications: Secondary | ICD-10-CM | POA: Diagnosis not present

## 2023-09-24 DIAGNOSIS — Z01818 Encounter for other preprocedural examination: Secondary | ICD-10-CM | POA: Insufficient documentation

## 2023-09-24 DIAGNOSIS — I10 Essential (primary) hypertension: Secondary | ICD-10-CM | POA: Insufficient documentation

## 2023-09-24 HISTORY — DX: Personal history of urinary calculi: Z87.442

## 2023-09-24 HISTORY — DX: Pneumoconiosis due to asbestos and other mineral fibers: J61

## 2023-09-24 HISTORY — DX: Pneumonia, unspecified organism: J18.9

## 2023-09-24 LAB — BASIC METABOLIC PANEL
Anion gap: 9 (ref 5–15)
BUN: 21 mg/dL (ref 8–23)
CO2: 26 mmol/L (ref 22–32)
Calcium: 9.7 mg/dL (ref 8.9–10.3)
Chloride: 104 mmol/L (ref 98–111)
Creatinine, Ser: 1.2 mg/dL (ref 0.61–1.24)
GFR, Estimated: 60 mL/min — ABNORMAL LOW (ref >60)
Glucose, Bld: 106 mg/dL — ABNORMAL HIGH (ref 70–99)
Potassium: 4.7 mmol/L (ref 3.5–5.1)
Sodium: 139 mmol/L (ref 135–145)

## 2023-09-24 LAB — CBC
HCT: 40.5 % (ref 39.0–52.0)
Hemoglobin: 12.7 g/dL — ABNORMAL LOW (ref 13.0–17.0)
MCH: 28.9 pg (ref 26.0–34.0)
MCHC: 31.4 g/dL (ref 30.0–36.0)
MCV: 92.3 fL (ref 80.0–100.0)
Platelets: 209 10*3/uL (ref 150–400)
RBC: 4.39 MIL/uL (ref 4.22–5.81)
RDW: 13.4 % (ref 11.5–15.5)
WBC: 7.1 10*3/uL (ref 4.0–10.5)
nRBC: 0 % (ref 0.0–0.2)

## 2023-09-24 LAB — HEMOGLOBIN A1C
Hgb A1c MFr Bld: 6.6 % — ABNORMAL HIGH (ref 4.8–5.6)
Mean Plasma Glucose: 142.72 mg/dL

## 2023-09-24 LAB — GLUCOSE, CAPILLARY: Glucose-Capillary: 102 mg/dL — ABNORMAL HIGH (ref 70–99)

## 2023-09-24 NOTE — Telephone Encounter (Signed)
Patient reports of what felt like a "shock" in patients chest 3 times. Patient denies chest pain, shortness of breath, dizziness or other associating symptoms. Patient denies any further episodes over the weekend. Patient educated that pacemakers are not able to shock, only pace. Patient advised to continue to monitor and given ED precautions if he does experience chest pain or other concerning symptoms.

## 2023-09-24 NOTE — Progress Notes (Signed)
PERIOPERATIVE PRESCRIPTION FOR IMPLANTED CARDIAC DEVICE PROGRAMMING  Patient Information: Name:  Edgar Evans  DOB:  1940-09-06  MRN:  [id number]    Planned Procedure:  Water-jet ablation of prostate  Surgeon: Dr Vilma Prader  Date of Procedure: 10-04-2023  Cautery will be used.  Position during surgery: Lithotomy   Please send documentation back to:  [facility] (Fax# [phone]) or Respond to the IB message.  Device Information:  Clinic EP Physician:  Dr. York Pellant   Device Type:  Pacemaker Manufacturer and Phone #:  St. Jude/Abbott: [phone] Pacemaker Dependent?:  No. Date of Last Device Check:  08/29/23 Normal Device Function?:  Yes.    Electrophysiologist's Recommendations:  Have magnet available. Provide continuous ECG monitoring when magnet is used or reprogramming is to be performed.  Procedure should not interfere with device function.  No device programming or magnet placement needed.  Per Device Clinic Standing Orders, Lenor Coffin, RN  8:16 AM 09/24/2023

## 2023-09-25 LAB — URINE CULTURE: Culture: NO GROWTH

## 2023-09-25 NOTE — Telephone Encounter (Signed)
I s/w the pt to tell him that we needed to change his appt. He see's EP only and was placed on gen card schedule. Pt has been changed over to EP, APP Francis Dowse, [facility] 09/28/23 @ 1:30. Pt thanked me for the call and the help.

## 2023-09-26 ENCOUNTER — Ambulatory Visit: Payer: Medicare HMO | Admitting: Physician Assistant

## 2023-09-28 ENCOUNTER — Ambulatory Visit: Payer: Medicare HMO | Attending: Physician Assistant | Admitting: Physician Assistant

## 2023-09-28 VITALS — BP 146/74 | HR 74 | Ht 72.0 in | Wt 182.0 lb

## 2023-09-28 DIAGNOSIS — Z01818 Encounter for other preprocedural examination: Secondary | ICD-10-CM

## 2023-09-28 DIAGNOSIS — R0789 Other chest pain: Secondary | ICD-10-CM

## 2023-09-28 DIAGNOSIS — Z950 Presence of cardiac pacemaker: Secondary | ICD-10-CM | POA: Diagnosis not present

## 2023-09-28 DIAGNOSIS — I442 Atrioventricular block, complete: Secondary | ICD-10-CM

## 2023-09-28 DIAGNOSIS — I10 Essential (primary) hypertension: Secondary | ICD-10-CM

## 2023-09-28 DIAGNOSIS — Z01810 Encounter for preprocedural cardiovascular examination: Secondary | ICD-10-CM

## 2023-09-28 NOTE — Anesthesia Preprocedure Evaluation (Signed)
Anesthesia Evaluation    Airway        Dental   Pulmonary former smoker          Cardiovascular hypertension,      Neuro/Psych    GI/Hepatic   Endo/Other  diabetes    Renal/GU      Musculoskeletal   Abdominal   Peds  Hematology   Anesthesia Other Findings   Reproductive/Obstetrics                             Anesthesia Physical Anesthesia Plan  ASA:   Anesthesia Plan:    Post-op Pain Management:    Induction:   PONV Risk Score and Plan:   Airway Management Planned:   Additional Equipment:   Intra-op Plan:   Post-operative Plan:   Informed Consent:   Plan Discussed with:   Anesthesia Plan Comments: (See PAT note 09/24/2023)       Anesthesia Quick Evaluation

## 2023-09-28 NOTE — Progress Notes (Signed)
Cardiology Office Note:  .   Date:  09/28/2023  ID:  Edgar Evans, DOB [birth year], MRN [id number] PCP: Donita Brooks, MD  [facility] Providers Cardiologist:  None Electrophysiologist:  Maurice Small, MD {  History of Present Illness: .   Edgar Evans is a 83 y.o. male w/PMHx of HTN, HLD, symptomatic heart block w/PPM  He saw Dr. Nelly Laurence 12/29/22, doing well, pacer functioning normally, no changes were made.  Today's visit is scheduled as a pre-op evaluation visit RCRI score is zero (0.4%)  ROS:   He is doing well But mentions he had an episode of CP and called EMS about a week or so ago. He was sitting down, watching TV, had 3 sudden sharp pains in the center of his chest. They were sharp, momentary, 3 times in quick succession and gone. No associated symptoms Says his wife told him color was good But had never had anything like that so they called EMS to be safe None since No CP of any kind ever otherwise  He does not exercise but takes care of all of the ADLs  Does the cooking/cleaning, grocery's, errands, cares for the yard when not winter. His living area is down stairs, and the kitchen is upstairs, says he makes several trips up/down the steps every day without difficulty, symptoms or having to stop No exertional intolerances, able to walk from the parking garage into our building without difficulty  No dizzy spells, near syncope or syncope. No SOB, DOE  Device information Abbott dual chamber PPM implanted 12/26/2013    Studies Reviewed: Marland Kitchen    EKG not done today  Personal;ly reviewed: 09/24/23: SR, some baseline artifact, no ST/T changes, normal 1/22/225: EMS EKG: SR 79npm, normal, no ST/T changes  DEVICE interrogation done today and reviewed by myself Battery and lead measurements are good There is an alert of a RV noise reversion episode without an EGM He has had 2 PATs One logged as an AHR episode the other as a VHR Longest 3 min 45sec I  was able to only very briefly provoke noise on the RV lead with LUE ROM exercises, though only once and transiently No other provocative maneuvers replicated noise AP 1.5% VP <1% I did make ventricular noise reversion a high alert to get an idea of burden/EGMs   12/12/2013 TTE LVEF 55-60% Mild MR   Risk Assessment/Calculations:    Physical Exam:   VS:  There were no vitals taken for this visit.   Wt Readings from Last 3 Encounters:  09/24/23 180 lb (81.6 kg)  01/11/23 186 lb 3.2 oz (84.5 kg)  12/29/22 189 lb 6.4 oz (85.9 kg)    GEN: Well nourished, well developed in no acute distress NECK: No JVD; No carotid bruits CARDIAC: RR, no murmurs, rubs, gallops RESPIRATORY:  CTA b/l without rales, wheezing or rhonchi  ABDOMEN: Soft, non-tender, non-distended EXTREMITIES:  No edema; No deformity   PPM site: is stable, no thinning, fluctuation, tethering  ASSESSMENT AND PLAN: .    PPM Intact function RV lead noise as discussed above Will have him back in the [place] office in 3-4 months to follow up He VP <1%  HTN Looks OK  3. CP Very atypical No prior or since Good exertional capacity  Normal EKGs  4.  Pre-op evaluation Protate surgery considered low cardiac risk procedure Low RCRI score no particular pacer management need for prostate surgery No cardiac contraindication to prostate surgery/procedure    Dispo: as  above, sooner if needed  Signed, Sheilah Pigeon, PA-C

## 2023-09-28 NOTE — Patient Instructions (Addendum)
Medication Instructions:   Your physician recommends that you continue on your current medications as directed. Please refer to the Current Medication list given to you today.   *If you need a refill on your cardiac medications before your next appointment, please call your pharmacy*   Lab Work: NONE ORDERED  TODAY    If you have labs (blood work) drawn today and your tests are completely normal, you will receive your results only by: MyChart Message (if you have MyChart) OR A paper copy in the mail If you have any lab test that is abnormal or we need to change your treatment, we will call you to review the results.   Testing/Procedures: NONE ORDERED  TODAY    Follow-Up: At [facility], you and your health needs are our priority.  As part of our continuing mission to provide you with exceptional heart care, we have created designated Provider Care Teams.  These Care Teams include your primary Cardiologist (physician) and Advanced Practice Providers (APPs -  Physician Assistants and Nurse Practitioners) who all work together to provide you with the care you need, when you need it.  We recommend signing up for the patient portal called "MyChart".  Sign up information is provided on this After Visit Summary.  MyChart is used to connect with patients for Virtual Visits (Telemedicine).  Patients are able to view lab/test results, encounter notes, upcoming appointments, etc.  Non-urgent messages can be sent to your provider as well.   To learn more about what you can do with MyChart, go to [email or web address].    Your next appointment:   3 -4 month(s)  IN [place]   Provider:    DR Nelly Laurence     Other Instructions    1st Floor: - Lobby - Registration  - Pharmacy  - Lab - Cafe  2nd Floor: - PV Lab - Diagnostic Testing (echo, CT, nuclear med)  3rd Floor: - Vacant  4th Floor: - TCTS (cardiothoracic surgery) - AFib Clinic - Structural Heart Clinic - Vascular  Surgery  - Vascular Ultrasound  5th Floor: - HeartCare Cardiology (general and EP) - Clinical Pharmacy for coumadin, hypertension, lipid, weight-loss medications, and med management appointments    Valet parking services will be available as well.
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
## 2023-10-04 ENCOUNTER — Encounter (HOSPITAL_COMMUNITY): Payer: Self-pay | Admitting: Urology

## 2023-10-04 ENCOUNTER — Other Ambulatory Visit: Payer: Self-pay

## 2023-10-04 ENCOUNTER — Ambulatory Visit (HOSPITAL_BASED_OUTPATIENT_CLINIC_OR_DEPARTMENT_OTHER): Payer: Medicare HMO | Admitting: Anesthesiology

## 2023-10-04 ENCOUNTER — Ambulatory Visit (HOSPITAL_COMMUNITY)
Admission: RE | Admit: 2023-10-04 | Discharge: 2023-10-05 | Disposition: A | Discharge status: Home / Self Care | Payer: Medicare HMO | Source: Ambulatory Visit | Attending: Urology | Admitting: Urology

## 2023-10-04 ENCOUNTER — Encounter (HOSPITAL_COMMUNITY)
Admission: RE | Disposition: A | Discharge status: Home / Self Care | Payer: Self-pay | Source: Ambulatory Visit | Attending: Urology

## 2023-10-04 ENCOUNTER — Ambulatory Visit (HOSPITAL_COMMUNITY): Payer: Self-pay | Admitting: Physician Assistant

## 2023-10-04 DIAGNOSIS — E119 Type 2 diabetes mellitus without complications: Secondary | ICD-10-CM

## 2023-10-04 DIAGNOSIS — R350 Frequency of micturition: Secondary | ICD-10-CM | POA: Diagnosis not present

## 2023-10-04 DIAGNOSIS — N35919 Unspecified urethral stricture, male, unspecified site: Secondary | ICD-10-CM | POA: Diagnosis not present

## 2023-10-04 DIAGNOSIS — N400 Enlarged prostate without lower urinary tract symptoms: Secondary | ICD-10-CM | POA: Diagnosis not present

## 2023-10-04 DIAGNOSIS — R3912 Poor urinary stream: Secondary | ICD-10-CM | POA: Insufficient documentation

## 2023-10-04 DIAGNOSIS — Z79899 Other long term (current) drug therapy: Secondary | ICD-10-CM | POA: Diagnosis not present

## 2023-10-04 DIAGNOSIS — Z87891 Personal history of nicotine dependence: Secondary | ICD-10-CM | POA: Insufficient documentation

## 2023-10-04 DIAGNOSIS — Z01818 Encounter for other preprocedural examination: Secondary | ICD-10-CM

## 2023-10-04 DIAGNOSIS — Z7984 Long term (current) use of oral hypoglycemic drugs: Secondary | ICD-10-CM | POA: Insufficient documentation

## 2023-10-04 DIAGNOSIS — N401 Enlarged prostate with lower urinary tract symptoms: Secondary | ICD-10-CM | POA: Insufficient documentation

## 2023-10-04 DIAGNOSIS — N138 Other obstructive and reflux uropathy: Secondary | ICD-10-CM

## 2023-10-04 DIAGNOSIS — R3914 Feeling of incomplete bladder emptying: Secondary | ICD-10-CM | POA: Insufficient documentation

## 2023-10-04 DIAGNOSIS — I10 Essential (primary) hypertension: Secondary | ICD-10-CM

## 2023-10-04 DIAGNOSIS — K219 Gastro-esophageal reflux disease without esophagitis: Secondary | ICD-10-CM | POA: Insufficient documentation

## 2023-10-04 LAB — GLUCOSE, CAPILLARY
Glucose-Capillary: 120 mg/dL — ABNORMAL HIGH (ref 70–99)
Glucose-Capillary: 148 mg/dL — ABNORMAL HIGH (ref 70–99)
Glucose-Capillary: 134 mg/dL — ABNORMAL HIGH (ref 70–99)
Glucose-Capillary: 139 mg/dL — ABNORMAL HIGH (ref 70–99)
Glucose-Capillary: 165 mg/dL — ABNORMAL HIGH (ref 70–99)

## 2023-10-04 SURGERY — ABLATION, PROSTATE, TRANSURETHRAL, USING WATERJET
Anesthesia: General

## 2023-10-04 MED ORDER — GENTAMICIN SULFATE 40 MG/ML IJ SOLN
400.0000 mg | INTRAVENOUS | Status: AC
  Administered 2023-10-04: 400 mg via INTRAVENOUS
  Filled 2023-10-04: qty 10

## 2023-10-04 MED ORDER — SUGAMMADEX SODIUM 200 MG/2ML IV SOLN
INTRAVENOUS | Status: DC | PRN
  Administered 2023-10-04: 200 mg via INTRAVENOUS

## 2023-10-04 MED ORDER — ROSUVASTATIN CALCIUM 10 MG PO TABS
10.0000 mg | ORAL_TABLET | Freq: Every day | ORAL | Status: DC
  Administered 2023-10-05: 10 mg via ORAL
  Filled 2023-10-04: qty 1

## 2023-10-04 MED ORDER — OXYCODONE HCL 5 MG PO TABS: 5.0000 mg | ORAL_TABLET | ORAL | Status: DC | PRN

## 2023-10-04 MED ORDER — OXYCODONE HCL 5 MG/5ML PO SOLN: 5.0000 mg | Freq: Once | ORAL | Status: AC | PRN

## 2023-10-04 MED ORDER — PROPOFOL 500 MG/50ML IV EMUL
INTRAVENOUS | Status: AC
  Filled 2023-10-04: qty 50

## 2023-10-04 MED ORDER — ROCURONIUM BROMIDE 10 MG/ML (PF) SYRINGE
PREFILLED_SYRINGE | INTRAVENOUS | Status: AC
  Filled 2023-10-04: qty 10

## 2023-10-04 MED ORDER — PROPOFOL 500 MG/50ML IV EMUL
INTRAVENOUS | Status: DC | PRN
  Administered 2023-10-04: 100 ug/kg/min via INTRAVENOUS
  Administered 2023-10-04: 50 mg via INTRAVENOUS

## 2023-10-04 MED ORDER — OXYCODONE HCL 5 MG PO TABS
ORAL_TABLET | ORAL | Status: AC
  Filled 2023-10-04: qty 1

## 2023-10-04 MED ORDER — FINASTERIDE 5 MG PO TABS
5.0000 mg | ORAL_TABLET | Freq: Every day | ORAL | Status: DC
  Administered 2023-10-04 – 2023-10-05 (×2): 5 mg via ORAL
  Filled 2023-10-04 (×2): qty 1

## 2023-10-04 MED ORDER — ACETAMINOPHEN 500 MG PO TABS: 1000.0000 mg | ORAL_TABLET | Freq: Once | ORAL | Status: DC

## 2023-10-04 MED ORDER — INSULIN ASPART 100 UNIT/ML IJ SOLN
4.0000 [IU] | Freq: Three times a day (TID) | INTRAMUSCULAR | Status: DC
  Administered 2023-10-04 – 2023-10-05 (×2): 4 [IU] via SUBCUTANEOUS

## 2023-10-04 MED ORDER — PROPOFOL 10 MG/ML IV BOLUS
INTRAVENOUS | Status: AC
  Filled 2023-10-04: qty 20

## 2023-10-04 MED ORDER — DIPHENHYDRAMINE HCL 50 MG/ML IJ SOLN
12.5000 mg | Freq: Four times a day (QID) | INTRAMUSCULAR | Status: DC | PRN
Start: 2023-10-04 — End: 2023-10-05

## 2023-10-04 MED ORDER — LIDOCAINE HCL (PF) 2 % IJ SOLN
INTRAMUSCULAR | Status: AC
  Filled 2023-10-04: qty 5

## 2023-10-04 MED ORDER — DEXAMETHASONE SODIUM PHOSPHATE 10 MG/ML IJ SOLN
INTRAMUSCULAR | Status: AC
  Filled 2023-10-04: qty 1

## 2023-10-04 MED ORDER — FENTANYL CITRATE (PF) 100 MCG/2ML IJ SOLN
INTRAMUSCULAR | Status: AC
  Filled 2023-10-04: qty 2

## 2023-10-04 MED ORDER — ACETAMINOPHEN 500 MG PO TABS
1000.0000 mg | ORAL_TABLET | Freq: Four times a day (QID) | ORAL | Status: DC
  Administered 2023-10-04 – 2023-10-05 (×3): 1000 mg via ORAL
  Filled 2023-10-04 (×3): qty 2

## 2023-10-04 MED ORDER — INSULIN ASPART 100 UNIT/ML IJ SOLN
0.0000 [IU] | Freq: Three times a day (TID) | INTRAMUSCULAR | Status: DC
Start: 2023-10-04 — End: 2023-10-05

## 2023-10-04 MED ORDER — MEPERIDINE HCL 50 MG/ML IJ SOLN: 6.2500 mg | INTRAMUSCULAR | Status: DC | PRN

## 2023-10-04 MED ORDER — STERILE WATER FOR IRRIGATION IR SOLN
Status: DC | PRN
  Administered 2023-10-04: 500 mL

## 2023-10-04 MED ORDER — SODIUM CHLORIDE 0.9 % IV SOLN: INTRAVENOUS | Status: DC

## 2023-10-04 MED ORDER — ONDANSETRON HCL 4 MG/2ML IJ SOLN
INTRAMUSCULAR | Status: AC
  Filled 2023-10-04: qty 2

## 2023-10-04 MED ORDER — PROPOFOL 1000 MG/100ML IV EMUL
INTRAVENOUS | Status: AC
  Filled 2023-10-04: qty 100

## 2023-10-04 MED ORDER — ONDANSETRON HCL 4 MG/2ML IJ SOLN: 4.0000 mg | INTRAMUSCULAR | Status: DC | PRN

## 2023-10-04 MED ORDER — DEXAMETHASONE SODIUM PHOSPHATE 10 MG/ML IJ SOLN
INTRAMUSCULAR | Status: DC | PRN
  Administered 2023-10-04: 4 mg via INTRAVENOUS

## 2023-10-04 MED ORDER — FENTANYL CITRATE PF 50 MCG/ML IJ SOSY
PREFILLED_SYRINGE | INTRAMUSCULAR | Status: AC
  Filled 2023-10-04: qty 2

## 2023-10-04 MED ORDER — INSULIN ASPART 100 UNIT/ML IJ SOLN: 0.0000 [IU] | Freq: Every day | INTRAMUSCULAR | Status: DC

## 2023-10-04 MED ORDER — ROCURONIUM BROMIDE 10 MG/ML (PF) SYRINGE
PREFILLED_SYRINGE | INTRAVENOUS | Status: DC | PRN
  Administered 2023-10-04: 10 mg via INTRAVENOUS
  Administered 2023-10-04: 20 mg via INTRAVENOUS
  Administered 2023-10-04: 60 mg via INTRAVENOUS

## 2023-10-04 MED ORDER — SENNOSIDES-DOCUSATE SODIUM 8.6-50 MG PO TABS
2.0000 | ORAL_TABLET | Freq: Every day | ORAL | Status: DC
  Administered 2023-10-04: 2 via ORAL
  Filled 2023-10-04: qty 2

## 2023-10-04 MED ORDER — ACETAMINOPHEN 160 MG/5ML PO SOLN: 325.0000 mg | ORAL | Status: DC | PRN

## 2023-10-04 MED ORDER — OXYBUTYNIN CHLORIDE 5 MG PO TABS
5.0000 mg | ORAL_TABLET | Freq: Three times a day (TID) | ORAL | Status: DC | PRN
  Administered 2023-10-04: 5 mg via ORAL
  Filled 2023-10-04: qty 1

## 2023-10-04 MED ORDER — SODIUM CHLORIDE 0.9 % IR SOLN
3000.0000 mL | Status: DC
  Administered 2023-10-04 (×8): 3000 mL

## 2023-10-04 MED ORDER — OXYCODONE HCL 5 MG PO TABS
5.0000 mg | ORAL_TABLET | Freq: Once | ORAL | Status: AC | PRN
  Administered 2023-10-04: 5 mg via ORAL

## 2023-10-04 MED ORDER — FENTANYL CITRATE PF 50 MCG/ML IJ SOSY
25.0000 ug | PREFILLED_SYRINGE | INTRAMUSCULAR | Status: DC | PRN
  Administered 2023-10-04 (×2): 25 ug via INTRAVENOUS
  Administered 2023-10-04 (×2): 50 ug via INTRAVENOUS

## 2023-10-04 MED ORDER — ACETAMINOPHEN 325 MG PO TABS
325.0000 mg | ORAL_TABLET | ORAL | Status: DC | PRN
Start: 2023-10-04 — End: 2023-10-04

## 2023-10-04 MED ORDER — 0.9 % SODIUM CHLORIDE (POUR BTL) OPTIME
TOPICAL | Status: DC | PRN
  Administered 2023-10-04: 1000 mL

## 2023-10-04 MED ORDER — VITAMIN C 500 MG PO TABS
500.0000 mg | ORAL_TABLET | Freq: Every day | ORAL | Status: DC
  Administered 2023-10-05: 500 mg via ORAL
  Filled 2023-10-04 (×2): qty 1

## 2023-10-04 MED ORDER — CHLORHEXIDINE GLUCONATE CLOTH 2 % EX PADS
6.0000 | MEDICATED_PAD | Freq: Every day | CUTANEOUS | Status: DC
  Administered 2023-10-04: 6 via TOPICAL

## 2023-10-04 MED ORDER — SODIUM CHLORIDE 0.9 % IR SOLN
Status: DC | PRN
  Administered 2023-10-04 (×3): 3000 mL
  Administered 2023-10-04: 6000 mL

## 2023-10-04 MED ORDER — VITAMIN D 25 MCG (1000 UNIT) PO TABS
1000.0000 [IU] | ORAL_TABLET | Freq: Every day | ORAL | Status: DC
  Administered 2023-10-05: 1000 [IU] via ORAL
  Filled 2023-10-04 (×2): qty 1

## 2023-10-04 MED ORDER — ONDANSETRON HCL 4 MG/2ML IJ SOLN
INTRAMUSCULAR | Status: DC | PRN
  Administered 2023-10-04: 4 mg via INTRAVENOUS

## 2023-10-04 MED ORDER — LIDOCAINE 2% (20 MG/ML) 5 ML SYRINGE
INTRAMUSCULAR | Status: DC | PRN
  Administered 2023-10-04: 100 mg via INTRAVENOUS

## 2023-10-04 MED ORDER — ONDANSETRON HCL 4 MG/2ML IJ SOLN: 4.0000 mg | Freq: Once | INTRAMUSCULAR | Status: DC | PRN

## 2023-10-04 MED ORDER — FENTANYL CITRATE PF 50 MCG/ML IJ SOSY
PREFILLED_SYRINGE | INTRAMUSCULAR | Status: AC
  Filled 2023-10-04: qty 1

## 2023-10-04 MED ORDER — FENTANYL CITRATE (PF) 100 MCG/2ML IJ SOLN
INTRAMUSCULAR | Status: DC | PRN
  Administered 2023-10-04: 25 ug via INTRAVENOUS

## 2023-10-04 MED ORDER — ETOMIDATE 2 MG/ML IV SOLN
INTRAVENOUS | Status: DC | PRN
  Administered 2023-10-04: 10 mg via INTRAVENOUS

## 2023-10-04 MED ORDER — CHLORHEXIDINE GLUCONATE 0.12 % MT SOLN
15.0000 mL | Freq: Once | OROMUCOSAL | Status: AC
  Administered 2023-10-04: 15 mL via OROMUCOSAL

## 2023-10-04 MED ORDER — ORAL CARE MOUTH RINSE: 15.0000 mL | Freq: Once | OROMUCOSAL | Status: AC

## 2023-10-04 MED ORDER — DIPHENHYDRAMINE HCL 12.5 MG/5ML PO ELIX: 12.5000 mg | ORAL_SOLUTION | Freq: Four times a day (QID) | ORAL | Status: DC | PRN

## 2023-10-04 MED ORDER — LACTATED RINGERS IV SOLN: INTRAVENOUS | Status: DC

## 2023-10-04 SURGICAL SUPPLY — 26 items
BAG URINE DRAIN 2000ML AR STRL (UROLOGICAL SUPPLIES) ×1 IMPLANT
CANISTER SUCT 3000ML PPV (MISCELLANEOUS) ×1 IMPLANT
CATH HEMA 3WAY 30CC 22FR COUDE (CATHETERS) IMPLANT
CATH HEMA 3WAY 30CC 24FR COUDE (CATHETERS) IMPLANT
COVER MAYO STAND STRL (DRAPES) ×1 IMPLANT
DRAPE FOOT SWITCH (DRAPES) ×1 IMPLANT
EVACUATOR MICROVAS BLADDER (UROLOGICAL SUPPLIES) IMPLANT
GEL ULTRASOUND 8.5O AQUASONIC (MISCELLANEOUS) ×1 IMPLANT
GLOVE SURG LX STRL 8.0 MICRO (GLOVE) ×1 IMPLANT
GOWN STRL REUS W/ TWL XL LVL3 (GOWN DISPOSABLE) ×1 IMPLANT
HANDPIECE AQUABEAM (MISCELLANEOUS) ×1 IMPLANT
HOLDER FOLEY CATH W/STRAP (MISCELLANEOUS) IMPLANT
KIT TURNOVER KIT A (KITS) IMPLANT
LOOP CUT BIPOLAR 24F LRG (ELECTROSURGICAL) IMPLANT
MANIFOLD NEPTUNE II (INSTRUMENTS) ×1 IMPLANT
MAT ABSORB FLUID 56X50 GRAY (MISCELLANEOUS) ×1 IMPLANT
PACK CYSTO (CUSTOM PROCEDURE TRAY) ×1 IMPLANT
PACK DRAPE AQUABEAM (MISCELLANEOUS) ×1 IMPLANT
PAD PREP 24X48 CUFFED NSTRL (MISCELLANEOUS) ×1 IMPLANT
SYR 30ML LL (SYRINGE) ×1 IMPLANT
SYR TOOMEY IRRIG 70ML (MISCELLANEOUS) ×2
SYRINGE TOOMEY IRRIG 70ML (MISCELLANEOUS) ×2 IMPLANT
TOWEL OR 17X26 10 PK STRL BLUE (TOWEL DISPOSABLE) ×1 IMPLANT
TUBING CONNECTING 10 (TUBING) ×2 IMPLANT
TUBING UROLOGY SET (TUBING) ×1 IMPLANT
UNDERPAD 30X36 HEAVY ABSORB (UNDERPADS AND DIAPERS) ×1 IMPLANT

## 2023-10-04 NOTE — Op Note (Signed)
 Preoperative diagnosis: BPH with lower urinary tract symptoms, weak stream, frequency  Postoperative diagnosis: Same   Procedure: Robotic water  jet ablation of the prostate   Surgeon: Steffan Pea  Anesthesia: General   Indication for procedure: BPH  Findings:  Cystoscopy revealed large distended bladder some diverticuli, no tumors  3 passes were completed by the aqua ablation  All chips were removedd PT did have a distal bulbar urethral stx that made placing 101fr 3 way catheter difficult    Description of procedure:  He was brought to the operating room and placed supine on the operating table.  After adequate anesthesia he was placed lithotomy position. Timeout was performed to confirm the patient and procedure. The TRUS Stepper was mounted to the Articulating Arm and secured to OR bed. The ultrasound probe was attached to the stepper. Exam under anesthesia was performed and the TRUS was inserted per rectum.  There was no resistance. The ultrasound probe was aligned, and confirmation made that the prostate is centered and aligned using both transverse and sagittal views. The bladder neck, verumontanum and the central/transition zones were identified.  Genitalia were prepped and draped in the usual sterile fashion. The 75F AQUABEAM Handpiece is inserted into the prostatic urethra and a complete cystoscopic evaluation was performed by inspecting the prostate, bladder, and identifying the location of the verumontanum/external sphincter. The AQUABEAM Handpiece was secured to the Handpiece Articulating Arm. Confirmed alignment of AQUABEAM Handpiece and TRUS Probe to be parallel and colinear. Confirmation that AQUABEAM nozzle is centered and anterior of the bladder neck or the median lobe. The cystoscope was then retracted to visualize the verumontanum and external sphincter and the cystoscope tip was positioned just proximal to the external sphincter. Reconfirmed alignment of the TRUS probe with  the AQUABEAM Handpiece and compression applied with TRUS probe. Horizontal alignment of the Handpiece waterjet nozzle was performed. The Aquablation treatment zones were planned utilizing real-time TRUS to visualize the contour of the prostate and the depth and radial angles of resection were defined in the transverse view. In the sagittal view, the AQUABEAM nozzle is identified and position registered with software. The treatment contours were then adjusted to conform to the intended resection margins. The median lobe, bladder neck and verumontanum were marked and confirmed in the treatment contour. The Aquablation Treatment was then started following the resection contour confirmed under ultrasound guidance. TOTAL AQUABLATION RESECTION TIME: 11 minutes _ Once Aquablation resection was complete the 24 French aqua beam handpiece was carefully removed.  The continuous-flow sheath with the visual obturator was passed and then the loop and handle.  The trigone and the ureteral orifices were identified.  Resection of some of the residual median lobe and bladder neck tissue was done.  The bladder neck was identified at 6:00 and this was taken up to 12:00 with fulguration of the bladder neck and prostate for hemostasis.  Slight amount of anterior tissue was resected.  Similarly from 6:00 up to 12:00 on the left side of the bladder neck was identified by resecting some of the ablated tissue to identify the bladder neck and cauterize any bleeding.  Some anterior tissue on the left was resected.  This created excellent hemostasis.  All the chips were evacuated.  Ureteral orifices again identified and noted to be normal without injury.  The scope was backed out and a 22 French hematuria catheter was placed with 30 cc in the balloon. There was some concern about irrigation and the scope was reinserted into the bladder to  confirm all chips were removed. The 51fr 3 way catheter was then placed over a wire back into the  bladder. The balloon was seated at the bladder neck and it was irrigated on light traction and noted to be clear to pink.  He was hooked up to CBI.  He was cleaned up and placed supine.  Catheter was placed on traction.  He was awakened and taken to the cover room in stable condition.  Complications: None  Blood loss: 100 mL  Specimens: some prostate chips   Drains: 22 French three-way hematuria catheter with 30 cc in the balloon  Disposition: Patient stable to PACU

## 2023-10-04 NOTE — H&P (Signed)
 83 year old male initially seen by our clinic for gross hematuria evaluation CT hematuria demonstrated no concerns for malignancy but did show a small bladder calculus cystoscopy demonstrated significant prostatic regrowth as well as a mild pendulous urethral stricture. Patient refused intervention for his recurrent prostatic growth after previous laser TURP.   Sees cardiology for pacemaker cardiologist is Dr. Lynwood Rakers from [facility], other cards ROS negative. NO COPD, ASthma, OSA. No blood thinners   Previous GU hx: TURP in 2009 and 2018, mid pendulous urethral stricture.  Lower urinary tract symptoms:   LUTS:  07/10/23 Lower urinary tract symptoms worsening nocturia and urgency patient is concerned about the bladder stone causing the symptoms. Denies gross hematuria denies urinary tract infection.  08/06/23: cystoscopy today eval pendulous urethral stx and BPH regrowth, cysto shows large regroth pt right side and large median lobe. Prostate US  117 grams would like to do aquaablaiton.  10/04/23: patient here today fro aqua ablation     ALLERGIES: Alfuzosin - Other Reaction (Moderate to Severe), hypotension Penicillins Tamsulosin  - Dizziness, Dropped blood pressure Tetracycline HCl CAPS    MEDICATIONS: Lisinopril  10 mg tablet 1 tablet PO Daily  Metformin  Hcl  Proscar  5 mg tablet 1 tablet PO Daily  Daily Multivitamin With D3 0.4 mg tablet Oral  Ofev   Prinivil  5 mg tablet Oral  Simvastatin  TABS Oral     GU PSH: Cysto Dilate Stricture (M or F) - 2018 Cystoscopy - 12/27/2022, 2023, 2022, 2018 Cystoscopy TURP - 2009 ESWL, Left - 2020 Laser Surgery Prostate - 2018 Locm 300-399Mg /Ml Iodine,1Ml - 2022 Subsequent Male VB Sounds - 2018       PSH Notes: Pacemaker Placement, Leg Repair, Transurethral Resection Of Prostate (TURP), Nose Surgery, Back Surgery, Hip Surgery   NON-GU PSH: Visit Complexity (formerly GPC1X) - 07/10/2023     GU PMH: Anterior urethral stricture (Stable) - 07/10/2023,  No significant stricture seen, - 12/27/2022, Easily passed by the scope, - 2023, - 2022, Moderate urethral stricture in penile urethra, dilated today., - 2018 Bladder Stone - 07/10/2023, Not seen today., - 12/27/2022, Stone at bladder neck now within the bladder., - 2023 BPH w/LUTS - 07/10/2023, He has had a TURP as well as a thulium laser TURP in 2009 and 18, respectively. He does have moderate regrowth. He does not tolerate alpha blockers., - 12/27/2022, Symptoms worsening. He does have obstruction on cystoscopy today, - 2022, - 2022, Stable urinary symptomatology. No sx's of recurrent obstruction. , - 2021, Urinary symptoms stable, - 2020, Symptoms improved following laser TURP, - 2019 (Stable), Altogether voiding better following laser TURP., - 2018, His prostate is open, however he did develop a urethral stricture which was dilated today., - 2018 (Worsening), Significant symptomatology from regrowth of prostate after TURP approximately 9 years ago. He has increasing residual urine volume. There is obvious obstruction cystoscopically., - 2018, - 2018, Benign prostatic hyperplasia with urinary obstruction, - 2016 Nocturia (Stable) - 07/10/2023, - 2017 Bladder-neck stenosis/contracture, Regrowth of prostate tissue after vaporization of prostate - 2023, Bladder neck contracture, - 2016 Elevated PSA, Basically stable PSA. He does have firmness in his prostate but he has had 2 prior outlet obstruction procedures performed. Stable nodule on the right - 2022, Elevated prostate specific antigen (PSA), - 2016 Gross hematuria, More than likely from his prostatic urethra. This is cleared - 2022, - 2022, - 2022 History of urolithiasis (Stable), He does have some small stones at his bladder neck/prostatic area. - 2022, - 2022 Encounter for Prostate Cancer screening - 2022 Weak  Urinary Stream (Stable) - 08/24/21, (Stable), - 2017-08-24 (Stable), - 08/24/2016, Weak urinary stream, - 2015-08-25 Urinary Calculus, Unspec, KUB and RUS today clear.  No recurrent sx's. We discussed stone prevention strategies. He would like to continue with annual surveillance. 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, Erectile dysfunction due to arterial insufficiency, - 08-24-13 Microscopic hematuria, He does have initial hematuria occasionally and microscopic hematuria today - 2018/08/24 Straining on Urination - 08/24/17 Urinary Urgency - August 24, 2017 Urinary Frequency - 08/24/2017 Incomplete bladder emptying - 08-24-2017 Urinary Retention, Unspec, Incomplete bladder emptying - 08/25/2015 Prostate nodule w/o LUTS, Nodular prostate without lower urinary tract symptoms - 2014-08-24      PMH Notes:  2007-05-07 08:51:19 - Note: Fracture Of Multiple Hand Bones   NON-GU PMH: Encounter for general adult medical examination without abnormal findings, Encounter for preventive health examination - Aug 25, 2015 Personal history of other diseases of the digestive system, History of esophageal reflux - 2013/08/24    FAMILY HISTORY: Death - Father, Mother Family Health Status Number - Runs In Family Kidney Stones - Runs In Family   SOCIAL HISTORY: Marital Status: Married Preferred Language: English; Ethnicity: Not Hispanic Or Latino; Race: White Current Smoking Status: Patient does not smoke anymore.  Social Drinker.  Patient's occupation is/was Retired.    REVIEW OF SYSTEMS:    GU Review Male:   Patient reports frequent urination and get up at night to urinate. Patient denies hard to postpone urination, burning/ pain with urination, leakage of urine, stream starts and stops, trouble starting your stream, have to strain to urinate , erection problems, and penile pain.  Gastrointestinal (Upper):   Patient denies vomiting, indigestion/ heartburn, and nausea.  Gastrointestinal (Lower):   Patient denies diarrhea and constipation.  Constitutional:   Patient denies fever, night sweats, weight loss, and fatigue.  Skin:   Patient denies skin rash/ lesion and itching.  Eyes:   Patient denies  blurred vision and double vision.  Ears/ Nose/ Throat:   Patient denies sore throat and sinus problems.  Hematologic/Lymphatic:   Patient denies swollen glands and easy bruising.  Cardiovascular:   Patient denies leg swelling and chest pains.  Respiratory:   Patient denies cough and shortness of breath.  Endocrine:   Patient denies excessive thirst.  Musculoskeletal:   Patient denies back pain and joint pain.  Neurological:   Patient denies headaches and dizziness.  Psychologic:   Patient denies depression and anxiety.   VITAL SIGNS: None   Complexity of Data:   07/14/21 01/04/18 09/16/13 09/12/12 03/10/10 03/19/09 02/02/09 08/06/08  PSA  Total PSA 4.85 ng/mL 3.29 ng/mL 4.38  3.62  3.51  3.30  13.90  3.29   Free PSA   1.47        % Free PSA   34          10/02/05  Hormones  Testosterone, Total 3.53     PROCEDURES:         Flexible Cystoscopy - 52000  Risks, benefits, and some of the potential complications of the procedure were discussed at length with the patient including infection, bleeding, voiding discomfort, urinary retention, fever, chills, sepsis, and others. All questions were answered. Informed consent was obtained.. Sterile technique and intraurethral analgesia were used.  Meatus:  Normal size. Normal location. Normal condition.  Urethra:  No strictures.  External Sphincter:  Normal.  Verumontanum:  Normal.  Prostate:  4.5 cm PU, significant prostatic regrowth on R side, large intravesical prostate  Bladder Neck:  Non-obstructing.  Ureteral Orifices:  only able to see on retroflex  Bladder:  no tumors, multiple diverticuli, large bladder,       The lower urinary tract was carefully examined. The procedure was well-tolerated and without complications. Antibiotic instructions were given. Instructions were given to call the office immediately for bloody urine, difficulty urinating, urinary retention, painful or frequent urination, fever, chills, nausea, vomiting or other  illness. The patient stated that he understood these instructions and would comply with them.          Prostate Ultrasound - 23127  Length: 6.62 cm Height: 5.92 cm Width: 5.74 cm Volume: 117.63 ml      The transrectal ultrasound probe is introduced into the rectum, and the prostate is visualized. Ultrasonography is utilized throughout the procedure. At the conclusion of the procedure, the ultrasound probe is removed. The patient tolerates the procedure without complication.  Patient confirmed No Neulasta OnPro Device.           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 Prostate ultrasound demonstrates 117 g gland. Patient is 83 years old does not want to go through large surgery will set patient up for aqua ablation if insurance does not approve we will plan for reTURP.   Patient was cleared by insurance plan for aquablation today.   Pt has cardiac clearance from [facility] with [facility]   We discussed risk benefits alternatives to transurethral resection of the prostate and Aquaablation. Benefits discussed were improvement in urination symptoms. We also discussed risk which included failure to completely  resolve lower urinary tract symptoms, bleeding requiring transfusion, infection, and damage to surrounding structures. We also discussed risk of stricture formation, bladder neck contracture, prostatic regrowth,possible retrograde ejaculation, and discovery of cancer with the pathology. The need for catheter postoperatively was discussed as well. The patient voices understanding and would like to proceed with the procedure.

## 2023-10-04 NOTE — Transfer of Care (Signed)
 Immediate Anesthesia Transfer of Care Note  Patient: Edgar Evans  Procedure(s) Performed: TRANSURETHRAL WATERJET ABLATION OF PROSTATE  Patient Location: PACU  Anesthesia Type:General  Level of Consciousness: sedated  Airway & Oxygen Therapy: Patient Spontanous Breathing and Patient connected to face mask  Post-op Assessment: Report given to RN and Post -op Vital signs reviewed and stable  Post vital signs: Reviewed and stable  Last Vitals:  Vitals Value Taken Time  BP 114/60 10/04/23 1042  Temp    Pulse 60 10/04/23 1044  Resp 14 10/04/23 1044  SpO2 100 % 10/04/23 1044  Vitals shown include unfiled device data.  Last Pain:  Vitals:   10/04/23 0659  TempSrc: Oral         Complications: No notable events documented.

## 2023-10-04 NOTE — Anesthesia Procedure Notes (Addendum)
 Procedure Name: Intubation Date/Time: 10/04/2023 8:53 AM  Performed by: Carleton Garnette SAUNDERS, CRNAPre-anesthesia Checklist: Patient identified, Emergency Drugs available, Suction available, Patient being monitored and Timeout performed Patient Re-evaluated:Patient Re-evaluated prior to induction Oxygen Delivery Method: Circle system utilized Preoxygenation: Pre-oxygenation with 100% oxygen Induction Type: IV induction Ventilation: Mask ventilation without difficulty and Oral airway inserted - appropriate to patient size Laryngoscope Size: Cleotilde and 2 Grade View: Grade I Tube type: Oral Tube size: 7.5 mm Number of attempts: 1 Airway Equipment and Method: Stylet Placement Confirmation: ETT inserted through vocal cords under direct vision, positive ETCO2 and breath sounds checked- equal and bilateral Secured at: 21 cm Tube secured with: Tape Dental Injury: Teeth and Oropharynx as per pre-operative assessment

## 2023-10-04 NOTE — Plan of Care (Signed)
 Pt transferred from PACU to floor. CBI cont'd. Skin assessment completed with second RN. No skin breakdown. Traction d/c'd. Family at bedside

## 2023-10-04 NOTE — Anesthesia Postprocedure Evaluation (Signed)
 Anesthesia Post Note  Patient: Edgar Evans  Procedure(s) Performed: TRANSURETHRAL WATERJET ABLATION OF PROSTATE     Patient location during evaluation: PACU Anesthesia Type: General Level of consciousness: awake and alert Pain management: pain level controlled Vital Signs Assessment: post-procedure vital signs reviewed and stable Respiratory status: spontaneous breathing, nonlabored ventilation, respiratory function stable and patient connected to nasal cannula oxygen Cardiovascular status: blood pressure returned to baseline and stable Postop Assessment: no apparent nausea or vomiting Anesthetic complications: no   No notable events documented.  Last Vitals:  Vitals:   10/04/23 1215 10/04/23 1230  BP: 135/71 124/61  Pulse: (!) 59 60  Resp: 12 16  Temp:    SpO2: 100% 100%    Last Pain:  Vitals:   10/04/23 1230  TempSrc:   PainSc: 0-No pain                 Ignazio Kincaid

## 2023-10-05 DIAGNOSIS — N35919 Unspecified urethral stricture, male, unspecified site: Secondary | ICD-10-CM | POA: Diagnosis not present

## 2023-10-05 DIAGNOSIS — I10 Essential (primary) hypertension: Secondary | ICD-10-CM | POA: Diagnosis not present

## 2023-10-05 DIAGNOSIS — N401 Enlarged prostate with lower urinary tract symptoms: Secondary | ICD-10-CM | POA: Diagnosis not present

## 2023-10-05 DIAGNOSIS — R3914 Feeling of incomplete bladder emptying: Secondary | ICD-10-CM | POA: Diagnosis not present

## 2023-10-05 DIAGNOSIS — K219 Gastro-esophageal reflux disease without esophagitis: Secondary | ICD-10-CM | POA: Diagnosis not present

## 2023-10-05 DIAGNOSIS — Z87891 Personal history of nicotine dependence: Secondary | ICD-10-CM | POA: Diagnosis not present

## 2023-10-05 DIAGNOSIS — E119 Type 2 diabetes mellitus without complications: Secondary | ICD-10-CM | POA: Diagnosis not present

## 2023-10-05 DIAGNOSIS — Z79899 Other long term (current) drug therapy: Secondary | ICD-10-CM | POA: Diagnosis not present

## 2023-10-05 DIAGNOSIS — R3912 Poor urinary stream: Secondary | ICD-10-CM | POA: Diagnosis not present

## 2023-10-05 LAB — CBC
HCT: 32.4 % — ABNORMAL LOW (ref 39.0–52.0)
Hemoglobin: 10.3 g/dL — ABNORMAL LOW (ref 13.0–17.0)
MCH: 28.9 pg (ref 26.0–34.0)
MCHC: 31.8 g/dL (ref 30.0–36.0)
MCV: 91 fL (ref 80.0–100.0)
Platelets: 172 10*3/uL (ref 150–400)
RBC: 3.56 MIL/uL — ABNORMAL LOW (ref 4.22–5.81)
RDW: 13.4 % (ref 11.5–15.5)
WBC: 8.6 10*3/uL (ref 4.0–10.5)
nRBC: 0 % (ref 0.0–0.2)

## 2023-10-05 LAB — SURGICAL PATHOLOGY

## 2023-10-05 LAB — GLUCOSE, CAPILLARY: Glucose-Capillary: 118 mg/dL — ABNORMAL HIGH (ref 70–99)

## 2023-10-05 MED ORDER — POLYETHYLENE GLYCOL 3350 17 G PO PACK
17.0000 g | PACK | Freq: Every day | ORAL | 0 refills | Status: DC
Start: 2023-10-05 — End: 2024-02-01

## 2023-10-05 MED ORDER — METHOCARBAMOL 750 MG PO TABS: 750.0000 mg | ORAL_TABLET | Freq: Four times a day (QID) | ORAL | 0 refills | Status: AC

## 2023-10-05 NOTE — Discharge Instructions (Signed)
 Transurethral Resection of the Prostate (TURP)   Care After  Refer to this sheet in the next few weeks. These discharge instructions provide you with general information on caring for yourself after you leave the hospital. Your caregiver may also give you specific instructions. Your treatment has been planned according to the most current medical practices available, but unavoidable complications sometimes occur. If you have any problems or questions after discharge, please call your caregiver.  HOME CARE INSTRUCTIONS   Medications You may receive medicine for pain management. As your level of discomfort decreases, adjustments in your pain medicines may be made.  Take all medicines as directed.  You may be given a medicine (antibiotic) to kill germs following surgery. Finish all medicines. Let your caregiver know if you have any side effects or problems from the medicine.  If you are on aspirin, it would be best not to restart the aspirin until the blood in the urine clears Please do not restart Aspirin until Monday 12/10/23.  Please discuss your Metoprolol dose with your PCP or cardiologist as you heart rate was very low when you came in for surgery  Hygiene You can take a shower after surgery.  You should not take a bath while you still have the urethral catheter. Activity You will be encouraged to get out of bed as much as possible and increase your activity level as tolerated.  Spend the first week in and around your home. For 3 weeks, avoid the following:  Straining.  Running.  Strenuous work.  Walks longer than a few blocks.  Riding for extended periods.  Sexual relations.  Do not lift heavy objects (more than 10 pounds) for at least 1 month. When lifting, use your arms instead of your abdominal muscles.  You will be encouraged to walk as tolerated. Do not exert yourself. Increase your activity level slowly. Remember that it is important to keep moving after an operation of any type.  This cuts down on the possibility of developing blood clots.  Your caregiver will tell you when you can resume driving and light housework. Discuss this at your first office visit after discharge. Diet No special diet is ordered after a TURP. However, if you are on a special diet for another medical problem, it should be continued.  Normal fluid intake is usually recommended.  Avoid alcohol and caffeinated drinks for 2 weeks. They irritate the bladder. Decaffeinated drinks are okay.  Avoid spicy foods.  Bladder Function For the first 3 weeks, empty the bladder whenever you feel a definite desire. Do not try to hold the urine for long periods of time. Urgency with urination can be normal after this surgery. Urinating once or twice a night even after you are healed is not uncommon.  You may see some recurrence of blood in the urine after discharge from the hospital. This usually happens within 2 weeks after the procedure.If this occurs, force fluids again as you did in the hospital and reduce your activity.  Bowel Function You may experience some constipation after surgery. This can be minimized by increasing fluids and fiber in your diet. Drink enough water and fluids to keep your urine clear or pale yellow.  A stool softener may be prescribed for use at home. Do not strain to move your bowels.  If you are requiring increased pain medicine, it is important that you take stool softeners to prevent constipation. This will help to promote proper healing by reducing the need to strain to move  your bowels.  Sexual Activity Semen movement in the opposite direction and into the bladder (retrograde ejaculation) may occur. Since the semen passes into the bladder, cloudy urine can occur the first time you urinate after intercourse. Or, you may not have an ejaculation during erection. Ask your caregiver when you can resume sexual activity. Retrograde ejaculation and reduced semen discharge should not reduce  one's pleasure of intercourse.  Postoperative Visit Arrange the date and time of your after surgery visit with your caregiver.  Return to Work After your recovery is complete, you will be able to return to work and resume all activities. Your caregiver will inform you when you can return to work.    Foley Catheter Care A soft, flexible tube (Foley catheter) may have been placed in your bladder to drain urine and fluid. Follow these instructions: Taking Care of the Catheter Keep the area where the catheter leaves your body clean.  Attach the catheter to the leg so there is no tension on the catheter.  Keep the drainage bag below the level of the bladder, but keep it OFF the floor.  Do not take long soaking baths. Your caregiver will give instructions about showering.  Wash your hands before touching ANYTHING related to the catheter or bag.  Using mild soap and warm water on a washcloth:  Clean the area closest to the catheter insertion site using a circular motion around the catheter.  Clean the catheter itself by wiping AWAY from the insertion site for several inches down the tube.  NEVER wipe upward as this could sweep bacteria up into the urethra (tube in your body that normally drains the bladder) and cause infection.  Place a small amount of sterile lubricant at the tip of the penis where the catheter is entering.  Taking Care of the Drainage Bags Two drainage bags may be taken home: a large overnight drainage bag, and a smaller leg bag which fits underneath clothing.  It is okay to wear the overnight bag at any time, but NEVER wear the smaller leg bag at night.  Keep the drainage bag well below the level of your bladder. This prevents backflow of urine into the bladder and allows the urine to drain freely.  Anchor the tubing to your leg to prevent pulling or tension on the catheter. Use tape or a leg strap provided by the hospital.  Empty the drainage bag when it is 1/2 to 3/4 full.  Wash your hands before and after touching the bag.  Periodically check the tubing for kinks to make sure there is no pressure on the tubing which could restrict the flow of urine.  Changing the Drainage Bags Cleanse both ends of the clean bag with alcohol before changing.  Pinch off the rubber catheter to avoid urine spillage during the disconnection.  Disconnect the dirty bag and connect the clean one.  Empty the dirty bag carefully to avoid a urine spill.  Attach the new bag to the leg with tape or a leg strap.  Cleaning the Drainage Bags Whenever a drainage bag is disconnected, it must be cleaned quickly so it is ready for the next use.  Wash the bag in warm, soapy water.  Rinse the bag thoroughly with warm water.  Soak the bag for 30 minutes in a solution of white vinegar and water (1 cup vinegar to 1 quart warm water).  Rinse with warm water.  SEEK MEDICAL CARE IF:  You have chills or night sweats.  You  are leaking around your catheter or have problems with your catheter. It is not uncommon to have sporadic leakage around your catheter as a result of bladder spasms. If the leakage stops, there is not much need for concern. If you are uncertain, call your caregiver.  You develop side effects that you think are coming from your medicines.  SEEK IMMEDIATE MEDICAL CARE IF:  You are suddenly unable to urinate. Check to see if there are any kinks in the drainage tubing that may cause this. If you cannot find any kinks, call your caregiver immediately. This is an emergency.  You develop shortness of breath or chest pains.  Bleeding persists or clots develop in your urine.  You have a fever.  You develop pain in your back or over your lower belly (abdomen).  You develop pain or swelling in your legs.  Any problems you are having get worse rather than better.  MAKE SURE YOU:  Understand these instructions.  Will watch your condition.  Will get help right away if you are not doing well or get  worse.

## 2023-10-05 NOTE — Discharge Summary (Signed)
 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   Kidney stone 08/25/2021   Knee pain 08/25/2021   Pain in toe 08/25/2021   Vitamin D  deficiency 08/25/2021   Pancreatic cyst    ILD (interstitial lung disease) (HCC) 09/18/2018   Asbestosis (HCC) 09/18/2018   Unilateral primary osteoarthritis, right hip 05/03/2018   Enlarged prostate with urinary obstruction 02/19/2017   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 Briefly, Edgar Evans is a 83 y.o. year old patient with BPH, previous history of TURP as well as lower urinary tract symptoms his prostate size 104 g he desires Coblation which was performed on 10/04/2023.SABRA   Hospital Course: Patient tolerated the procedure well.  He was then transferred to the floor after an uneventful PACU stay.  His hospital course was uncomplicated.  He was kept on CBI overnight which was clamped in the morning on a.m. rounds his urine was light pink to Kool-Aid color with no clots.  On POD# 1 he had met discharge  criteria: was eating a regular diet, was up and ambulating independently,  pain was well controlled, patient was to be discharged with catheter due to urethral stricture and dilation during aqua ablation.  Physical exam General: No acute distress Respiratory: Normal work of breathing on room air Cardiac: Regular rate per monitor Abdomen: Soft nontender nondistended GU: CBI in place with Kool-Aid to pink-colored urine no clots  Laboratory values:  Recent Labs    10/05/23 0518  WBC 8.6  HGB 10.3*  HCT 32.4*   No results for input(s): NA, K, CL, CO2, GLUCOSE, BUN, CREATININE, CALCIUM  in the last 72 hours. No results for input(s): LABPT, INR in the last 72 hours. No results for input(s): LABURIN in the last 72 hours. Results for orders placed or performed during the hospital encounter of 09/24/23  Urine Culture     Status: None   Collection Time: 09/24/23  1:06 PM   Specimen: Urine, Clean Catch  Result Value Ref Range Status   Specimen Description   Final    URINE, CLEAN CATCH Performed at [facility], [address] [place], [place] [address]    Special Requests   Final    NONE Performed at [facility], [address] [place], [place] [address]    Culture   Final    NO GROWTH Performed at [facility], [address], [place], [place] [address]    Report Status 09/25/2023 FINAL  Final    Disposition: Home  Discharge instruction: The patient was instructed to be ambulatory but told to refrain from heavy lifting, strenuous activity,  or driving.  Discharge directions were provided to the patient.  Including catheter care.  Discharge medications:  Allergies as of 10/05/2023       Reactions   Penicillins Swelling   Tongue Has patient had a PCN reaction causing immediate rash, facial/tongue/throat swelling, SOB or lightheadedness with hypotension:Yes Has patient had a PCN reaction causing severe rash  involving mucus membranes or skin necrosis:Unknown Has patient had a PCN reaction that required hospitalization:No Has patient had a PCN reaction occurring within the last 10 years:No If all of the above answers are NO, then may proceed with Cephalosporin use.    Tetanus Toxoids Swelling   Tongue        Medication List     TAKE these medications    ascorbic acid  500 MG tablet Commonly known as: VITAMIN C  Take 500 mg by mouth daily.   cholecalciferol  25 MCG (1000 UNIT) tablet Commonly known as: VITAMIN D3 Take 1,000 Units by mouth daily.   finasteride  5 MG tablet Commonly known as: PROSCAR  Take 5 mg by mouth daily.   lisinopril  10 MG tablet Commonly known as: ZESTRIL  Take 1 tablet (10 mg total) by mouth at bedtime.   metFORMIN  1000 MG tablet Commonly known as: GLUCOPHAGE  TAKE 1 TABLET BY MOUTH TWICE DAILY WITH MEALS   methocarbamol  750 MG tablet Commonly known as: ROBAXIN  Take 1 tablet (750 mg total) by mouth 4 (four) times daily for 20 doses. As needed for pain   polyethylene glycol 17 g packet Commonly known as: MiraLax  Take 17 g by mouth daily.   rosuvastatin  10 MG tablet Commonly known as: CRESTOR  TAKE ONE-HALF TABLET BY MOUTH EVERY NIGHT AT BEDTIME FOR CHOLESTEROL   sitaGLIPtin  100 MG tablet Commonly known as: JANUVIA  Take 100 mg by mouth daily.   vitamin E  1000 UNIT capsule Take 1,000 Units by mouth daily.        Followup:   Follow-up Information     Baldwin Ubaldo Motto, NP Follow up on 10/09/2023.   Specialty: Nurse Practitioner Why: Catheter removal at 830 AM Contact information: [address] [place] [address] [phone]

## 2023-10-09 ENCOUNTER — Ambulatory Visit (HOSPITAL_BASED_OUTPATIENT_CLINIC_OR_DEPARTMENT_OTHER): Payer: Medicare HMO | Admitting: Pulmonary Disease

## 2023-10-09 DIAGNOSIS — R351 Nocturia: Secondary | ICD-10-CM | POA: Diagnosis not present

## 2023-10-09 DIAGNOSIS — N401 Enlarged prostate with lower urinary tract symptoms: Secondary | ICD-10-CM | POA: Diagnosis not present

## 2023-10-09 DIAGNOSIS — N472 Paraphimosis: Secondary | ICD-10-CM | POA: Diagnosis not present

## 2023-10-09 DIAGNOSIS — N35013 Post-traumatic anterior urethral stricture: Secondary | ICD-10-CM | POA: Diagnosis not present

## 2023-10-10 NOTE — Progress Notes (Signed)
Remote pacemaker transmission.

## 2023-11-01 DIAGNOSIS — E119 Type 2 diabetes mellitus without complications: Secondary | ICD-10-CM | POA: Diagnosis not present

## 2023-11-01 DIAGNOSIS — L237 Allergic contact dermatitis due to plants, except food: Secondary | ICD-10-CM | POA: Diagnosis not present

## 2023-11-20 DIAGNOSIS — N401 Enlarged prostate with lower urinary tract symptoms: Secondary | ICD-10-CM | POA: Diagnosis not present

## 2023-11-20 DIAGNOSIS — N35013 Post-traumatic anterior urethral stricture: Secondary | ICD-10-CM | POA: Diagnosis not present

## 2023-11-20 DIAGNOSIS — R3914 Feeling of incomplete bladder emptying: Secondary | ICD-10-CM | POA: Diagnosis not present

## 2023-11-20 DIAGNOSIS — R351 Nocturia: Secondary | ICD-10-CM | POA: Diagnosis not present

## 2023-11-23 DIAGNOSIS — Z887 Allergy status to serum and vaccine status: Secondary | ICD-10-CM | POA: Diagnosis not present

## 2023-11-23 DIAGNOSIS — R0902 Hypoxemia: Secondary | ICD-10-CM | POA: Diagnosis not present

## 2023-11-23 DIAGNOSIS — I959 Hypotension, unspecified: Secondary | ICD-10-CM | POA: Diagnosis not present

## 2023-11-23 DIAGNOSIS — I10 Essential (primary) hypertension: Secondary | ICD-10-CM | POA: Diagnosis not present

## 2023-11-23 DIAGNOSIS — R55 Syncope and collapse: Secondary | ICD-10-CM | POA: Diagnosis not present

## 2023-11-23 DIAGNOSIS — I2510 Atherosclerotic heart disease of native coronary artery without angina pectoris: Secondary | ICD-10-CM | POA: Diagnosis not present

## 2023-11-23 DIAGNOSIS — Z880 Allergy status to penicillin: Secondary | ICD-10-CM | POA: Diagnosis not present

## 2023-11-23 DIAGNOSIS — N390 Urinary tract infection, site not specified: Secondary | ICD-10-CM | POA: Diagnosis not present

## 2023-11-28 ENCOUNTER — Ambulatory Visit (INDEPENDENT_AMBULATORY_CARE_PROVIDER_SITE_OTHER): Payer: Medicare PPO

## 2023-11-28 DIAGNOSIS — I442 Atrioventricular block, complete: Secondary | ICD-10-CM | POA: Diagnosis not present

## 2023-11-28 LAB — CUP PACEART REMOTE DEVICE CHECK
Battery Remaining Longevity: 25 mo
Battery Remaining Percentage: 20 %
Battery Voltage: 2.92 V
Brady Statistic AP VP Percent: 1 %
Brady Statistic AP VS Percent: 2.1 %
Brady Statistic AS VP Percent: 1 %
Brady Statistic AS VS Percent: 98 %
Brady Statistic RA Percent Paced: 2.1 %
Brady Statistic RV Percent Paced: 1 %
Date Time Interrogation Session: 20250402020031
Implantable Lead Connection Status: 753985
Implantable Lead Connection Status: 753985
Implantable Lead Implant Date: 20150501
Implantable Lead Implant Date: 20150501
Implantable Lead Location: 753859
Implantable Lead Location: 753860
Implantable Lead Model: 1948
Implantable Pulse Generator Implant Date: 20150501
Lead Channel Impedance Value: 400 Ohm
Lead Channel Impedance Value: 590 Ohm
Lead Channel Pacing Threshold Amplitude: 0.375 V
Lead Channel Pacing Threshold Amplitude: 0.5 V
Lead Channel Pacing Threshold Pulse Width: 0.5 ms
Lead Channel Pacing Threshold Pulse Width: 0.5 ms
Lead Channel Sensing Intrinsic Amplitude: 5 mV
Lead Channel Sensing Intrinsic Amplitude: 9.7 mV
Lead Channel Setting Pacing Amplitude: 0.625
Lead Channel Setting Pacing Amplitude: 1.5 V
Lead Channel Setting Pacing Pulse Width: 0.5 ms
Lead Channel Setting Sensing Sensitivity: 2 mV
Pulse Gen Model: 2240
Pulse Gen Serial Number: 7618665

## 2023-12-12 NOTE — Progress Notes (Signed)
 Remote pacemaker transmission.

## 2023-12-17 DIAGNOSIS — R300 Dysuria: Secondary | ICD-10-CM | POA: Diagnosis not present

## 2023-12-17 DIAGNOSIS — N401 Enlarged prostate with lower urinary tract symptoms: Secondary | ICD-10-CM | POA: Diagnosis not present

## 2023-12-28 ENCOUNTER — Encounter: Payer: Self-pay | Admitting: Cardiovascular Disease

## 2023-12-28 ENCOUNTER — Ambulatory Visit: Payer: Medicare PPO | Attending: Cardiovascular Disease | Admitting: Cardiovascular Disease

## 2023-12-28 VITALS — BP 120/68 | HR 67 | Ht 72.0 in | Wt 184.4 lb

## 2023-12-28 DIAGNOSIS — Z950 Presence of cardiac pacemaker: Secondary | ICD-10-CM

## 2023-12-28 NOTE — Patient Instructions (Addendum)

## 2023-12-28 NOTE — Progress Notes (Signed)
    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 male who presents today for routine electrophysiology followup.  Since last being seen in our clinic, the patient reports doing very well.  Today, he denies symptoms of palpitations, chest pain, shortness of breath,  lower extremity edema, dizziness, presyncope, or syncope.  The patient is otherwise without complaint today.     Physical Exam: Vitals:   12/28/23 0838  BP: 120/68  Pulse: 67  SpO2: 98%  Weight: 184 lb 6.4 oz (83.6 kg)  Height: 6' (1.829 m)     Gen: Appears comfortable, well-nourished CV: RRR, no dependent edema The device site is normal -- no tenderness, edema, drainage, redness, threatened erosion. Pulm: breathing easily   Pacemaker interrogation- reviewed in detail today,  See PACEART report RV lead noise noted in the past   Assessment and Plan:  1. Symptomatic second degree heart block Normal pacemaker function See Pace Art report No changes today he is not device dependant today Noise reversion episodes noted -- he does not require V pacing  2. HTN Stable No change required today  Return in a year  Efraim Grange, MD  12/28/2023 8:47 AM

## 2024-01-23 DIAGNOSIS — N210 Calculus in bladder: Secondary | ICD-10-CM | POA: Diagnosis not present

## 2024-01-23 DIAGNOSIS — R300 Dysuria: Secondary | ICD-10-CM | POA: Diagnosis not present

## 2024-01-23 DIAGNOSIS — N401 Enlarged prostate with lower urinary tract symptoms: Secondary | ICD-10-CM | POA: Diagnosis not present

## 2024-01-23 DIAGNOSIS — N35013 Post-traumatic anterior urethral stricture: Secondary | ICD-10-CM | POA: Diagnosis not present

## 2024-01-24 DIAGNOSIS — H2513 Age-related nuclear cataract, bilateral: Secondary | ICD-10-CM | POA: Diagnosis not present

## 2024-01-24 DIAGNOSIS — H0100B Unspecified blepharitis left eye, upper and lower eyelids: Secondary | ICD-10-CM | POA: Diagnosis not present

## 2024-01-24 DIAGNOSIS — E119 Type 2 diabetes mellitus without complications: Secondary | ICD-10-CM | POA: Diagnosis not present

## 2024-01-24 DIAGNOSIS — H353131 Nonexudative age-related macular degeneration, bilateral, early dry stage: Secondary | ICD-10-CM | POA: Diagnosis not present

## 2024-01-24 DIAGNOSIS — H43823 Vitreomacular adhesion, bilateral: Secondary | ICD-10-CM | POA: Diagnosis not present

## 2024-01-24 DIAGNOSIS — H0100A Unspecified blepharitis right eye, upper and lower eyelids: Secondary | ICD-10-CM | POA: Diagnosis not present

## 2024-01-24 DIAGNOSIS — H35373 Puckering of macula, bilateral: Secondary | ICD-10-CM | POA: Diagnosis not present

## 2024-01-24 DIAGNOSIS — H35343 Macular cyst, hole, or pseudohole, bilateral: Secondary | ICD-10-CM | POA: Diagnosis not present

## 2024-01-29 ENCOUNTER — Other Ambulatory Visit: Payer: Self-pay | Admitting: Urology

## 2024-02-07 NOTE — Progress Notes (Addendum)
 COVID Vaccine received:  []  No [x]  Yes Date of any COVID positive Test in last 90 days:  None   PCP - Eliane Grooms, MD  Cardiologist / EP - Marlane Silver, MD (LOV 12-28-23) Pulmonology- Celene Coins, MD    Chest x-ray - 11-10-2019  2v  Epic  CT Chest 01-03-2023  Epic EKG -  12-28-2023  Epic Stress Test -  ECHO -  Cardiac Cath -    Bowel Prep - [x]  No  []   Yes ______   Pacemaker / ICD device []  No [x]  Yes  [employer] 2240 Assurity Dual chamber  last checked 11-28-2023,  Device orders requested Annita Kindle was notified of the case    Spinal Cord Stimulator:[x]  No []  Yes       History of Sleep Apnea? [x]  No []  Yes   CPAP used?- [x]  No []  Yes     Does the patient monitor blood sugar?   []  N/A   []  No [x]  Yes  Patient has: []  NO Hx DM   []  Pre-DM   []  DM1  [x]   DM2 Last A1c was: 6.9 on 11-15-2023  per [facility].      Does patient have a [employer] or Dexacom? [x]  No []  Yes   Fasting Blood Sugar Ranges-  Checks Blood Sugar _1 times a week  Diabetic medications/ instructions:  Sitagliptin  (Januvia )  Hold DOS Metformin   Hold DOS    Blood Thinner / Instructions: none Aspirin Instructions:  none   ERAS Protocol Ordered: [x]  No  []  Yes Patient is to be NPO after: midnight prior   Dental hx: [x]  Dentures: Full set of  dentures []  N/A      []  Bridge or Partial:                   []  Loose or Damaged teeth:    Activity level: Patient is able to climb a flight of stairs without difficulty; [x]  No CP  [x]  No SOB  Patient can  perform ADLs without assistance.    Anesthesia review: DM2, HTN, PPM for CHB (12-28-2013), ILD- asbestosis, HOH- HAs   Patient denies shortness of breath, fever, cough and chest pain at PAT appointment.   Patient verbalized understanding and agreement to the Pre-Surgical Instructions that were given to them at this PAT appointment. Patient was also educated of the need to review these PAT instructions again prior to his surgery.I reviewed the appropriate  phone numbers to call if they have any and questions or concerns.

## 2024-02-07 NOTE — Patient Instructions (Addendum)
 SURGICAL WAITING ROOM VISITATION Patients having surgery or a procedure may have no more than 2 support people in the waiting area - these visitors may rotate in the visitor waiting room.   If the patient needs to stay at the hospital during part of their recovery, the visitor guidelines for inpatient rooms apply.   PRE-OP VISITATION  Pre-op nurse will coordinate an appropriate time for 1 support person to accompany the patient in pre-op.  This support person may not rotate.  This visitor will be contacted when the time is appropriate for the visitor to come back in the pre-op area.   Please refer to the [facility] website for the visitor guidelines for Inpatients (after your surgery is over and you are in a regular room).   You are not required to quarantine at this time prior to your surgery. However, you must do this: Hand Hygiene often Do NOT share personal items Notify your provider if you are in close contact with someone who has COVID or you develop fever 100.4 or greater, new onset of sneezing, cough, sore throat, shortness of breath or body aches.  If you test positive for Covid or have been in contact with anyone that has tested positive in the last 10 days please notify you surgeon.     Your procedure is scheduled on:  TUESDAY  February 19, 2024   Report to [facility] Main Entrance: [facility] entrance where the [employer] is available.    Report to admitting at: 12:30   PM   Call this number if you have any questions or problems the morning of surgery [phone]   DO NOT EAT OR DRINK ANYTHING AFTER MIDNIGHT THE NIGHT PRIOR TO YOUR SURGERY / PROCEDURE.    FOLLOW  ANY ADDITIONAL PRE OP INSTRUCTIONS YOU RECEIVED FROM YOUR SURGEON'S OFFICE!!!    Oral Hygiene is also important to reduce your risk of infection.        Remember - BRUSH YOUR TEETH THE MORNING OF SURGERY WITH YOUR REGULAR TOOTHPASTE   Do NOT smoke after Midnight the night before surgery.   Diabetic  medications/ instructions:  Sitagliptin  (Januvia )  DO NOT take the morning of your surgery. Metformin   DO NOT take the morning of your surgery   STOP TAKING all Vitamins, Herbs and supplements 1 week before your surgery.    Take ONLY these medicines the morning of surgery with A SIP OF WATER : none                    You may not have any metal on your body including  jewelry, and body piercing   Do not wear lotions, powders, cologne, or deodorant   Men may shave face and neck.   Contacts, Hearing Aids, dentures or bridgework may not be worn into surgery. DENTURES WILL BE REMOVED PRIOR TO SURGERY PLEASE DO NOT APPLY Poly grip OR ADHESIVES!!!   You may bring a small overnight bag with you on the day of surgery, only pack items that are not valuable. [facility] IS NOT RESPONSIBLE   FOR VALUABLES THAT ARE LOST OR STOLEN.    Do not bring your home medications to the hospital. The Pharmacy will dispense medications listed on your medication list to you during your admission in the Hospital.   Please read over the following fact sheets you were given: IF YOU HAVE QUESTIONS ABOUT YOUR PRE-OP INSTRUCTIONS, PLEASE CALL [phone].     [facility] - Preparing for Surgery  Before surgery, you can play an important role.  Because skin is not sterile, your skin needs to be as free of germs as possible.  You can reduce the number of germs on your skin by washing with CHG (chlorahexidine gluconate) soap before surgery.  CHG is an antiseptic cleaner which kills germs and bonds with the skin to continue killing germs even after washing. Please DO NOT use if you have an allergy to CHG or antibacterial soaps.  If your skin becomes reddened/irritated stop using the CHG and inform your nurse when you arrive at Short Stay. Do not shave (including legs and underarms) for at least 48 hours prior to the first CHG shower.  You may shave your face/neck.   Please follow these instructions carefully:              1.  Shower with CHG Soap the night before surgery and the  morning of surgery.             2.  If you choose to wash your hair, wash your hair first as usual with your normal  shampoo.             3.  After you shampoo, rinse your hair and body thoroughly to remove the shampoo.                                           4.  Use CHG as you would any other liquid soap.  You can apply chg directly to the skin and wash.  Gently with a scrungie or clean washcloth.             5.  Apply the CHG Soap to your body ONLY FROM THE NECK DOWN.   Do not use on face/ open                           Wound or open sores. Avoid contact with eyes, ears mouth and genitals (private parts).                       Wash face,  Genitals (private parts) with your normal soap.             6.  Wash thoroughly, paying special attention to the area where your             surgery  will be performed.             7.  Thoroughly rinse your body with warm water  from the neck down.             8.  DO NOT shower/wash with your normal soap after using and rinsing off the CHG Soap.            9.  Pat yourself dry with a clean towel.            10.  Wear clean pajamas.            11.  Place clean sheets on your bed the night of your first shower and do not  sleep with pets.   ON THE DAY OF SURGERY : Do not apply any lotions/deodorants the morning of surgery.  Please wear clean clothes to the hospital/surgery center.       FAILURE TO FOLLOW THESE INSTRUCTIONS MAY RESULT IN  THE CANCELLATION OF YOUR SURGERY   PATIENT SIGNATURE_________________________________   NURSE SIGNATURE__________________________________   ________________________________________________________________________

## 2024-02-08 ENCOUNTER — Other Ambulatory Visit: Payer: Self-pay

## 2024-02-08 ENCOUNTER — Encounter (HOSPITAL_COMMUNITY): Payer: Self-pay

## 2024-02-08 ENCOUNTER — Encounter (HOSPITAL_COMMUNITY)
Admission: RE | Admit: 2024-02-08 | Discharge: 2024-02-08 | Disposition: A | Discharge status: Home / Self Care | Source: Ambulatory Visit | Attending: Urology | Admitting: Urology

## 2024-02-08 VITALS — BP 121/54 | HR 68 | Temp 97.8°F | Resp 14 | Ht 72.0 in | Wt 184.0 lb

## 2024-02-08 DIAGNOSIS — Z01818 Encounter for other preprocedural examination: Secondary | ICD-10-CM

## 2024-02-08 DIAGNOSIS — E119 Type 2 diabetes mellitus without complications: Secondary | ICD-10-CM | POA: Diagnosis not present

## 2024-02-08 DIAGNOSIS — Z01812 Encounter for preprocedural laboratory examination: Secondary | ICD-10-CM | POA: Diagnosis not present

## 2024-02-08 DIAGNOSIS — I10 Essential (primary) hypertension: Secondary | ICD-10-CM | POA: Diagnosis not present

## 2024-02-08 LAB — CBC
HCT: 33.7 % — ABNORMAL LOW (ref 39.0–52.0)
Hemoglobin: 10.5 g/dL — ABNORMAL LOW (ref 13.0–17.0)
MCH: 27.9 pg (ref 26.0–34.0)
MCHC: 31.2 g/dL (ref 30.0–36.0)
MCV: 89.6 fL (ref 80.0–100.0)
Platelets: 264 10*3/uL (ref 150–400)
RBC: 3.76 MIL/uL — ABNORMAL LOW (ref 4.22–5.81)
RDW: 14.5 % (ref 11.5–15.5)
WBC: 7.2 10*3/uL (ref 4.0–10.5)
nRBC: 0 % (ref 0.0–0.2)

## 2024-02-08 LAB — BASIC METABOLIC PANEL WITH GFR
Anion gap: 8 (ref 5–15)
BUN: 29 mg/dL — ABNORMAL HIGH (ref 8–23)
CO2: 25 mmol/L (ref 22–32)
Calcium: 9.5 mg/dL (ref 8.9–10.3)
Chloride: 104 mmol/L (ref 98–111)
Creatinine, Ser: 1.36 mg/dL — ABNORMAL HIGH (ref 0.61–1.24)
GFR, Estimated: 51 mL/min — ABNORMAL LOW (ref >60)
Glucose, Bld: 106 mg/dL — ABNORMAL HIGH (ref 70–99)
Potassium: 4.7 mmol/L (ref 3.5–5.1)
Sodium: 137 mmol/L (ref 135–145)

## 2024-02-08 LAB — HEMOGLOBIN A1C
Hgb A1c MFr Bld: 6.6 % — ABNORMAL HIGH (ref 4.8–5.6)
Mean Plasma Glucose: 142.72 mg/dL

## 2024-02-08 LAB — GLUCOSE, CAPILLARY: Glucose-Capillary: 97 mg/dL (ref 70–99)

## 2024-02-08 NOTE — Progress Notes (Signed)
 PERIOPERATIVE PRESCRIPTION FOR IMPLANTED CARDIAC DEVICE PROGRAMMING  Patient Information: Name:  Edgar Evans  DOB:  [birth year]  MRN:  [id number]    Planned Procedure:  Cystoscopy w/ Direct vision internal urethrotomy- Optilume  Surgeon: Cathi Cluster  Date of Procedure: 02-19-24  Cautery will be used.  Position during surgery: Supine   Please send documentation back to:  [facility] (Fax# [phone]) or Respond to the IB message.  Let me know if the Company Rep. Needs to be contacted.  Device Information:  Clinic EP Physician:  Dr. Marlane Silver   Device Type:  Pacemaker Manufacturer and Phone #:  St. Jude/[employer]: [phone] Pacemaker Dependent?:  No. Date of Last Device Check:  12/28/2023  Normal Device Function?:  Yes.    Electrophysiologist's Recommendations:  Have magnet available. Provide continuous ECG monitoring when magnet is used or reprogramming is to be performed.  Procedure should not interfere with device function.  No device programming or magnet placement needed.  Per Device Clinic Standing Orders, Glorianne Largo, RN  9:26 AM 02/08/2024

## 2024-02-16 NOTE — H&P (Signed)
 83 year old male initially seen by our clinic for gross hematuria evaluation CT hematuria demonstrated no concerns for malignancy but did show a small bladder calculus cystoscopy demonstrated significant prostatic regrowth as well as a mild pendulous urethral stricture. Patient refused intervention for his recurrent prostatic growth after previous laser TURP.   Sees cardiology for pacemaker cardiologist is Dr. Lynwood Rakers from [facility], other cards ROS negative. NO COPD, ASthma, OSA. No blood thinners   Previous GU hx: TURP in 2009 and 2018, mid pendulous urethral stricture.  Lower urinary tract symptoms:   LUTS:  07/10/23 Lower urinary tract symptoms worsening nocturia and urgency patient is concerned about the bladder stone causing the symptoms. Denies gross hematuria denies urinary tract infection.  08/06/23: cystoscopy today eval pendulous urethral stx and BPH regrowth, cysto shows large regrowth pt right side and large median lobe. Prostate US  117 grams would like to do aquaablaiton.  10/09/2023: The patient underwent aquablation with Dr. Shane on 2/6. Pathology from submitted tissue was benign. Now back today for trial of void.  12/17/23: Pt had UTI 3 weeks ago. Burning with urination. No GH. Stream has been worsening. Patient has been having worsening stream over the course of a month. PVR 108. Over the past month stream has been wosening. Dysuria since friday.  01/23/24: repeat Cystoscopy concern for BNC or stricture. Cystoscopy shows distal bulbar to proximal pendulous urethral stricture, small meatal stx that was able to be passec by the scope.  02/18/24: DVIU and Urethral dilation today     ALLERGIES: Alfuzosin - Other Reaction (Moderate to Severe), hypotension Penicillins Tamsulosin  - Dizziness, Dropped blood pressure Tetracycline HCl CAPS   MEDICATIONS: Lisinopril  10 MG Tablet 1 tablet PO Daily  Metformin  Hcl  Multi Vitamin Tablet Oral  Ofev   Prinivil  5 MG Oral Tablet Oral   Simvastatin  TABS Oral    GU PSH: Complex Uroflow - 08/24/2023 Cysto Dilate Stricture (M or F) - 2018 Cystoscopy - 08/06/2023, 12/27/2022, 2023, 07/27/2021, 2018 Cystoscopy TURP - 2009 ESWL, Left - 2020 Laser Surgery Prostate - 2018 Locm 300-399Mg /Ml Iodine,1Ml - 07/25/2021 Subsequent Male VB Sounds - 2018      PSH Notes: Pacemaker Placement, Leg Repair, Transurethral Resection Of Prostate (TURP), Nose Surgery, Back Surgery, Hip Surgery  NON-GU PSH: Visit Complexity (formerly GPC1X) - 12/17/2023, 11/20/2023, 10/09/2023, 07/10/2023       GU PMH: BPH w/LUTS - 12/17/2023, - 11/20/2023, - 10/09/2023, - 08/24/2023, - 08/06/2023, - 07/10/2023, He has had a TURP as well as a thulium laser TURP in 2009 and 18, respectively. He does have moderate regrowth. He does not tolerate alpha blockers., - 12/27/2022, Symptoms worsening. He does have obstruction on cystoscopy today, - 07/27/2021, - 2022, Stable urinary symptomatology. No sx's of recurrent obstruction. , - 2021, Urinary symptoms stable, - 2020, Symptoms improved following laser TURP, - 2019 (Stable), Altogether voiding better following laser TURP., - 2018, His prostate is open, however he did develop a urethral stricture which was dilated today., - 2018 (Worsening), Significant symptomatology from regrowth of prostate after TURP approximately 9 years ago. He has increasing residual urine volume. There is obvious obstruction cystoscopically., - 2018, - 2018, Benign prostatic hyperplasia with urinary obstruction, - 2016 Dysuria - 12/17/2023 Anterior urethral stricture - 11/20/2023, - 10/09/2023, - 08/06/2023 (Stable), - 07/10/2023, No significant stricture seen, - 12/27/2022, Easily passed by the scope, - 2023, - 2022, Moderate urethral stricture in penile urethra, dilated today., - 2018 Incomplete bladder emptying - 11/20/2023, (Stable), - 08/06/2023, - 2018 Nocturia - 11/20/2023, - 10/09/2023 (  Stable), - 07/10/2023, - 2016/03/25 Paraphimosis - 10/09/2023 Weak Urinary  Stream (Stable) - 08/06/2023, (Stable), - 25-Mar-2021 (Stable), - 03-25-17 (Stable), - 2016-03-25, Weak urinary stream, - 03-26-2015 Bladder Stone - 07/10/2023, Not seen today., - 12/27/2022, Stone at bladder neck now within the bladder., 25-Mar-2022 Bladder-neck stenosis/contracture, Regrowth of prostate tissue after vaporization of prostate - 03-25-22, Bladder neck contracture, - 03-26-2015 Elevated PSA, Basically stable PSA. He does have firmness in his prostate but he has had 2 prior outlet obstruction procedures performed. Stable nodule on the right - 07/27/2021, Elevated prostate specific antigen (PSA), - 2015/03/26 Gross hematuria, More than likely from his prostatic urethra. This is cleared - 07/27/2021, - 07/25/2021, 03-25-2021 History of urolithiasis (Stable), He does have some small stones at his bladder neck/prostatic area. - 07/27/2021, 03/25/2021 Encounter for Prostate Cancer screening - 03/25/2021 Urinary Calculus, Unspec, KUB and RUS today clear. No recurrent sx's. We discussed stone prevention strategies. He would like to continue with annual surveillance. 25-Mar-2020 Ureteral calculus - 03/26/19 ED due to arterial insufficiency - 03/26/2019, Erectile dysfunction due to arterial insufficiency, - 03/25/13 Microscopic hematuria, He does have initial hematuria occasionally and microscopic hematuria today - 03-25-18 Straining on Urination - 2017/03/25 Urinary Urgency - 03-25-2017 Urinary Frequency - March 25, 2017 Urinary Retention, Unspec, Incomplete bladder emptying - 03-26-2015 Prostate nodule w/o LUTS, Nodular prostate without lower urinary tract symptoms - 03/25/2014     PMH Notes:  2007-05-07 08:51:19 - Note: Fracture Of Multiple Hand Bones  NON-GU PMH: Encounter for general adult medical examination without abnormal findings, Encounter for preventive health examination - 03-26-2015 Personal history of other diseases of the digestive system, History of esophageal reflux - Mar 25, 2013   FAMILY HISTORY: Death - Father, Mother Family Health Status Number - Runs In Family Kidney Stones - Runs In Family   SOCIAL HISTORY: Marital Status: Married Preferred Language: English; Ethnicity: Not Hispanic Or Latino; Race: White Current Smoking Status: Patient does not smoke anymore.  Social Drinker.  Patient's occupation is/was Retired.   REVIEW OF SYSTEMS:    GU Review Male:  Patient denies frequent urination, hard to postpone urination, burning/ pain with urination, get up at night to urinate, leakage of urine, stream starts and stops, trouble starting your stream, have to strain to urinate , erection problems, and penile pain.   Gastrointestinal (Upper):  Patient denies nausea, vomiting, and indigestion/ heartburn.   Gastrointestinal (Lower):  Patient denies diarrhea and constipation.   Constitutional:  Patient denies fever, night sweats, weight loss, and fatigue.   Skin:  Patient denies skin rash/ lesion and itching.   Eyes:  Patient denies blurred vision and double vision.   Ears/ Nose/ Throat:  Patient denies sore throat and sinus problems.   Hematologic/Lymphatic:  Patient denies swollen glands and easy bruising.   Cardiovascular:  Patient denies leg swelling and chest pains.   Respiratory:  Patient denies cough and shortness of breath.   Endocrine:  Patient denies excessive thirst.   Musculoskeletal:  Patient denies back pain and joint pain.   Neurological:  Patient denies headaches and dizziness.   Psychologic:  Patient denies depression and anxiety.   VITAL SIGNS: None    MULTI-SYSTEM PHYSICAL EXAMINATION:     Constitutional: Well-nourished. No physical deformities. Normally developed. Good grooming.    Respiratory: No labored breathing, no use of accessory muscles.     Cardiovascular: Normal temperature, normal extremity pulses, no swelling, no varicosities.          Complexity of Data:   Source  Of History:  Patient  Records Review:  Previous Patient Records  Urine Test Review:  Urinalysis    07/14/21 01/04/18 09/16/13 09/12/12 03/10/10 03/19/09 02/02/09 08/06/08  PSA  Total  PSA 4.85 ng/mL 3.29 ng/mL 4.38  3.62  3.51  3.30  13.90  3.29   Free PSA   1.47        % Free PSA   34          10/02/05  Hormones  Testosterone, Total 3.53     PROCEDURES:    Flexible Cystoscopy - 52000  Risks, benefits, and some of the potential complications of the procedure were discussed at length with the patient including infection, bleeding, voiding discomfort, urinary retention, fever, chills, sepsis, and others. All questions were answered. Informed consent was obtained. Antibiotic prophylaxis was given. Sterile technique and intraurethral analgesia were used.  Meatus:  Mild narrowing able to pass by the scope.  Urethra:  Distal bulbar proximal pendulous with 8 French stricture unable to pass scope for this about 2 cm long it was a dilated urethra beyond the stricture.      The lower urinary tract was carefully examined. The procedure was well-tolerated and without complications. Antibiotic instructions were given. Instructions were given to call the office immediately for bloody urine, difficulty urinating, urinary retention, painful or frequent urination, fever, chills, nausea, vomiting or other illness. The patient stated that he understood these instructions and would comply with them.    Visit Complexity - G2211      Urinalysis w/Scope  Dipstick Dipstick Cont'd Micro  Color: Yellow Bilirubin: Neg mg/dL WBC/hpf: 0 - 5/hpf  Appearance: Clear Ketones: Neg mg/dL RBC/hpf: 0 - 2/hpf  Specific Gravity: 1.015 Blood: Neg ery/uL Bacteria: Rare (0-9/hpf)  pH: 7.5 Protein: Trace mg/dL Cystals: NS (Not Seen)  Glucose: Neg mg/dL Urobilinogen: 0.2 mg/dL Casts: NS (Not Seen)   Nitrites: Neg Trichomonas: Not Present   Leukocyte Esterase: Trace leu/uL Mucous: Not Present    Epithelial Cells: NS (Not Seen)    Yeast: NS (Not Seen)    Sperm: Not Present    ASSESSMENT:     ICD-10 Details  1 GU:  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 Will need to dilate this and do Optilume dilation with DVIU versus dilation.  Urine Cx negative  Discussed risk events alternatives procedure including bleeding infection  Instructions need for chemotherapy agent need to use condom afterwards risk of DVIU injury to the urethra. Need for catheter postoperatively.

## 2024-02-18 MED ORDER — GENTAMICIN SULFATE 40 MG/ML IJ SOLN
5.0000 mg/kg | INTRAVENOUS | Status: AC
  Administered 2024-02-19: 417.6 mg via INTRAVENOUS
  Filled 2024-02-18: qty 10.5

## 2024-02-19 ENCOUNTER — Ambulatory Visit (HOSPITAL_COMMUNITY): Payer: Self-pay | Admitting: Physician Assistant

## 2024-02-19 ENCOUNTER — Encounter (HOSPITAL_COMMUNITY)
Admission: RE | Disposition: A | Discharge status: Home / Self Care | Payer: Self-pay | Source: Ambulatory Visit | Attending: Urology

## 2024-02-19 ENCOUNTER — Other Ambulatory Visit: Payer: Self-pay

## 2024-02-19 ENCOUNTER — Ambulatory Visit (HOSPITAL_COMMUNITY)

## 2024-02-19 ENCOUNTER — Ambulatory Visit (HOSPITAL_COMMUNITY)
Admission: RE | Admit: 2024-02-19 | Discharge: 2024-02-19 | Disposition: A | Discharge status: Home / Self Care | Source: Ambulatory Visit | Attending: Urology | Admitting: Urology

## 2024-02-19 ENCOUNTER — Encounter (HOSPITAL_COMMUNITY): Payer: Self-pay | Admitting: Urology

## 2024-02-19 DIAGNOSIS — E119 Type 2 diabetes mellitus without complications: Secondary | ICD-10-CM | POA: Insufficient documentation

## 2024-02-19 DIAGNOSIS — N401 Enlarged prostate with lower urinary tract symptoms: Secondary | ICD-10-CM | POA: Insufficient documentation

## 2024-02-19 DIAGNOSIS — R300 Dysuria: Secondary | ICD-10-CM | POA: Diagnosis not present

## 2024-02-19 DIAGNOSIS — Z950 Presence of cardiac pacemaker: Secondary | ICD-10-CM | POA: Insufficient documentation

## 2024-02-19 DIAGNOSIS — N35112 Postinfective bulbous urethral stricture, not elsewhere classified: Secondary | ICD-10-CM

## 2024-02-19 DIAGNOSIS — N35013 Post-traumatic anterior urethral stricture: Secondary | ICD-10-CM | POA: Diagnosis present

## 2024-02-19 DIAGNOSIS — I10 Essential (primary) hypertension: Secondary | ICD-10-CM | POA: Insufficient documentation

## 2024-02-19 DIAGNOSIS — Z01818 Encounter for other preprocedural examination: Secondary | ICD-10-CM

## 2024-02-19 DIAGNOSIS — N210 Calculus in bladder: Secondary | ICD-10-CM | POA: Insufficient documentation

## 2024-02-19 DIAGNOSIS — E785 Hyperlipidemia, unspecified: Secondary | ICD-10-CM

## 2024-02-19 DIAGNOSIS — Z794 Long term (current) use of insulin: Secondary | ICD-10-CM | POA: Insufficient documentation

## 2024-02-19 HISTORY — PX: CYSTOSCOPY WITH DIRECT VISION INTERNAL URETHROTOMY: SHX6637

## 2024-02-19 LAB — GLUCOSE, CAPILLARY
Glucose-Capillary: 115 mg/dL — ABNORMAL HIGH (ref 70–99)
Glucose-Capillary: 125 mg/dL — ABNORMAL HIGH (ref 70–99)

## 2024-02-19 SURGERY — CYSTOSCOPY, WITH DIRECT VISION INTERNAL URETHROTOMY
Anesthesia: General

## 2024-02-19 MED ORDER — SODIUM CHLORIDE 0.9 % IR SOLN
Status: DC | PRN
  Administered 2024-02-19: 3000 mL via INTRAVESICAL

## 2024-02-19 MED ORDER — LIDOCAINE HCL (CARDIAC) PF 100 MG/5ML IV SOSY
PREFILLED_SYRINGE | INTRAVENOUS | Status: DC | PRN
  Administered 2024-02-19: 40 mg via INTRAVENOUS

## 2024-02-19 MED ORDER — STERILE WATER FOR IRRIGATION IR SOLN
Status: DC | PRN
  Administered 2024-02-19: 10 mL

## 2024-02-19 MED ORDER — PROPOFOL 500 MG/50ML IV EMUL
INTRAVENOUS | Status: AC
  Filled 2024-02-19: qty 50

## 2024-02-19 MED ORDER — FENTANYL CITRATE PF 50 MCG/ML IJ SOSY: 25.0000 ug | PREFILLED_SYRINGE | INTRAMUSCULAR | Status: DC | PRN

## 2024-02-19 MED ORDER — ONDANSETRON HCL 4 MG/2ML IJ SOLN: 4.0000 mg | Freq: Four times a day (QID) | INTRAMUSCULAR | Status: DC | PRN

## 2024-02-19 MED ORDER — FENTANYL CITRATE (PF) 100 MCG/2ML IJ SOLN
INTRAMUSCULAR | Status: DC | PRN
  Administered 2024-02-19: 50 ug via INTRAVENOUS
  Administered 2024-02-19 (×2): 25 ug via INTRAVENOUS

## 2024-02-19 MED ORDER — PROPOFOL 500 MG/50ML IV EMUL
INTRAVENOUS | Status: DC | PRN
  Administered 2024-02-19: 100 ug/kg/min via INTRAVENOUS
  Administered 2024-02-19: 150 ug/kg/min via INTRAVENOUS

## 2024-02-19 MED ORDER — POLYETHYLENE GLYCOL 3350 17 G PO PACK: 17.0000 g | PACK | Freq: Every day | ORAL | 0 refills | Status: AC

## 2024-02-19 MED ORDER — OXYCODONE HCL 5 MG/5ML PO SOLN: 5.0000 mg | Freq: Once | ORAL | Status: DC | PRN

## 2024-02-19 MED ORDER — PHENYLEPHRINE 80 MCG/ML (10ML) SYRINGE FOR IV PUSH (FOR BLOOD PRESSURE SUPPORT)
PREFILLED_SYRINGE | INTRAVENOUS | Status: DC | PRN
Start: 2024-02-19 — End: 2024-02-19
  Administered 2024-02-19: 160 ug via INTRAVENOUS

## 2024-02-19 MED ORDER — METHOCARBAMOL 750 MG PO TABS: 750.0000 mg | ORAL_TABLET | Freq: Four times a day (QID) | ORAL | 0 refills | Status: AC

## 2024-02-19 MED ORDER — DEXAMETHASONE SODIUM PHOSPHATE 10 MG/ML IJ SOLN
INTRAMUSCULAR | Status: AC
Start: 2024-02-19 — End: 2024-02-19
  Filled 2024-02-19: qty 1

## 2024-02-19 MED ORDER — ONDANSETRON HCL 4 MG/2ML IJ SOLN
INTRAMUSCULAR | Status: AC
  Filled 2024-02-19: qty 2

## 2024-02-19 MED ORDER — ONDANSETRON HCL 4 MG/2ML IJ SOLN
INTRAMUSCULAR | Status: DC | PRN
  Administered 2024-02-19: 4 mg via INTRAVENOUS

## 2024-02-19 MED ORDER — LACTATED RINGERS IV SOLN: INTRAVENOUS | Status: DC

## 2024-02-19 MED ORDER — CHLORHEXIDINE GLUCONATE 0.12 % MT SOLN
15.0000 mL | Freq: Once | OROMUCOSAL | Status: AC
  Administered 2024-02-19: 15 mL via OROMUCOSAL

## 2024-02-19 MED ORDER — OXYCODONE HCL 5 MG PO TABS: 5.0000 mg | ORAL_TABLET | Freq: Once | ORAL | Status: DC | PRN

## 2024-02-19 MED ORDER — PHENYLEPHRINE 80 MCG/ML (10ML) SYRINGE FOR IV PUSH (FOR BLOOD PRESSURE SUPPORT)
PREFILLED_SYRINGE | INTRAVENOUS | Status: AC
Start: 2024-02-19 — End: 2024-02-19
  Filled 2024-02-19: qty 10

## 2024-02-19 MED ORDER — PROPOFOL 10 MG/ML IV BOLUS
INTRAVENOUS | Status: AC
  Filled 2024-02-19: qty 20

## 2024-02-19 MED ORDER — INSULIN ASPART 100 UNIT/ML IJ SOLN: 0.0000 [IU] | INTRAMUSCULAR | Status: DC | PRN

## 2024-02-19 MED ORDER — FENTANYL CITRATE (PF) 100 MCG/2ML IJ SOLN
INTRAMUSCULAR | Status: AC
  Filled 2024-02-19: qty 2

## 2024-02-19 MED ORDER — PROPOFOL 10 MG/ML IV BOLUS
INTRAVENOUS | Status: DC | PRN
  Administered 2024-02-19: 30 mg via INTRAVENOUS
  Administered 2024-02-19: 120 mg via INTRAVENOUS

## 2024-02-19 MED ORDER — LIDOCAINE HCL (PF) 2 % IJ SOLN
INTRAMUSCULAR | Status: AC
Start: 2024-02-19 — End: 2024-02-19
  Filled 2024-02-19: qty 5

## 2024-02-19 MED ORDER — ORAL CARE MOUTH RINSE: 15.0000 mL | Freq: Once | OROMUCOSAL | Status: AC

## 2024-02-19 MED ORDER — ROCURONIUM BROMIDE 10 MG/ML (PF) SYRINGE
PREFILLED_SYRINGE | INTRAVENOUS | Status: AC
Start: 2024-02-19 — End: 2024-02-19
  Filled 2024-02-19: qty 10

## 2024-02-19 MED ORDER — IOHEXOL 300 MG/ML  SOLN
INTRAMUSCULAR | Status: DC | PRN
  Administered 2024-02-19: 7 mL

## 2024-02-19 MED ORDER — DEXAMETHASONE SODIUM PHOSPHATE 4 MG/ML IJ SOLN
INTRAMUSCULAR | Status: DC | PRN
  Administered 2024-02-19: 8 mg via INTRAVENOUS

## 2024-02-19 SURGICAL SUPPLY — 20 items
BAG URINE DRAIN 2000ML AR STRL (UROLOGICAL SUPPLIES) ×1 IMPLANT
BALLOON NEPHROSTOMY (BALLOONS) IMPLANT
BALLOON OPTILUME DCB 30X5X75 (BALLOONS) IMPLANT
CATH FOLEY 2W COUNCIL 20FR 5CC (CATHETERS) IMPLANT
CATH FOLEY 2W COUNCIL 5CC 18FR (CATHETERS) IMPLANT
CATH ROBINSON RED A/P 14FR (CATHETERS) ×1 IMPLANT
CATH URET 5FR 70CM CONE TIP (BALLOONS) IMPLANT
CATH URETL OPEN END 6FR 70 (CATHETERS) IMPLANT
CLOTH BEACON ORANGE TIMEOUT ST (SAFETY) ×1 IMPLANT
GLOVE BIO SURGEON STRL SZ7.5 (GLOVE) ×1 IMPLANT
GOWN STRL REUS W/ TWL LRG LVL3 (GOWN DISPOSABLE) ×2 IMPLANT
GOWN STRL REUS W/ TWL XL LVL3 (GOWN DISPOSABLE) ×1 IMPLANT
GUIDEWIRE ANG ZIPWIRE 038X150 (WIRE) IMPLANT
GUIDEWIRE STR DUAL SENSOR (WIRE) ×1 IMPLANT
KIT TURNOVER KIT A (KITS) ×1 IMPLANT
MANIFOLD NEPTUNE II (INSTRUMENTS) IMPLANT
NS IRRIG 1000ML POUR BTL (IV SOLUTION) IMPLANT
PACK CYSTO (CUSTOM PROCEDURE TRAY) ×1 IMPLANT
PENCIL SMOKE EVACUATOR (MISCELLANEOUS) IMPLANT
WATER STERILE IRR 3000ML UROMA (IV SOLUTION) ×1 IMPLANT

## 2024-02-19 NOTE — Anesthesia Procedure Notes (Signed)
 Procedure Name: LMA Insertion Date/Time: 02/19/2024 2:59 PM  Performed by: Landy Chip HERO, CRNAPre-anesthesia Checklist: Patient identified, Emergency Drugs available, Suction available and Patient being monitored Patient Re-evaluated:Patient Re-evaluated prior to induction Oxygen Delivery Method: Circle System Utilized Preoxygenation: Pre-oxygenation with 100% oxygen Induction Type: IV induction Ventilation: Mask ventilation without difficulty LMA: LMA inserted LMA Size: 4.0 Number of attempts: 1 Airway Equipment and Method: Bite block Placement Confirmation: positive ETCO2 Tube secured with: Tape Dental Injury: Teeth and Oropharynx as per pre-operative assessment

## 2024-02-19 NOTE — Op Note (Signed)
 Operative note Optilume dilation and urethral dilation  Preoperative diagnosis: Pendulous urethral stricture  Postoperative diagnosis: Pendulous urethral stricture  Surgeon: Steffan Pea, MD  Procedure performed: Urethral dilation with drug-coated balloon, retrograde urethrogram, cystoscopy  Operative findings: Short pendulous urethral stricture about 3 cm in length noted to be about 10 [place]. Some apical prostatic regrowth with a weird band through the prostatic fossa this is broken up using the grasper Previous bladder undermining from aqua ablation noted.  Both ureteral orifices intact no tumors in the bladder. Urethra dilated to 24 [place] Optilume balloon placed at appropriate position under retrograde urethrogram and visual location.  Anesthesia: General  Antibiotics: Gentamicin   Fluids: Per anesthesia  EBL: Minimal  Drains: 18 French council tip catheter   Indication for procedure: Mr. Mcartor is a 83 year old gentleman who underwent Coblation earlier this year known to have worsening urinary symptoms postoperatively cystoscopy demonstrated urethral stricture he is here today for dilation of this and evaluation of his bladder.  Procedure in detail: After informed consent was confirmed the preoperative patient to the op suite placed under anesthesia by the anesthesia team he was then placed in the dorsolithotomy station next time performed verifying correct patient procedure and a 22 French cystoscope was advanced in the bladder the stricture was identified a sensor wire was placed past the stricture into the bladder this confirmed with fluoroscopy.  Next the scope was placed through the strictures into the bladder upon entering the bladder there was some apical regrowth this banding within the middle of the bladder that was easily broken up using the grasper.  The scope was then removed the UroMax balloon then dilated the urethra to 24 [place] after being placed in the  appropriate location.  The balloon was then removed.  A retrograde urethrogram was then performed showing a patent urethra and the balloon was then placed over the wire and using fluoroscopy placed the appropriate location and visualized in the private location using the scope.  The Optilume balloon was then inflated to 10 ATM and kept in place for 3 minutes.  After this was done the balloon was deflated removed and a council tip catheter was placed over the wire into the bladder.  Patient tolerated procedure well was extubated and sent to the PACU in stable condition.  Disposition: Patient be discharged home will void trial in 1 week.

## 2024-02-19 NOTE — Anesthesia Preprocedure Evaluation (Signed)
 Anesthesia Evaluation  Patient identified by MRN, date of birth, ID band Patient awake    Reviewed: Allergy & Precautions, H&P , NPO status , Patient's Chart, lab work & pertinent test results  Airway Mallampati: II   Neck ROM: full    Dental   Pulmonary former smoker   breath sounds clear to auscultation       Cardiovascular hypertension, + dysrhythmias + pacemaker  Rhythm:regular Rate:Normal     Neuro/Psych    GI/Hepatic ,GERD  ,,  Endo/Other  diabetes, Type 2    Renal/GU stones     Musculoskeletal  (+) Arthritis ,    Abdominal   Peds  Hematology   Anesthesia Other Findings   Reproductive/Obstetrics                             Anesthesia Physical Anesthesia Plan  ASA: 3  Anesthesia Plan: General   Post-op Pain Management:    Induction: Intravenous  PONV Risk Score and Plan: 2 and Ondansetron , Dexamethasone  and Treatment may vary due to age or medical condition  Airway Management Planned: LMA  Additional Equipment:   Intra-op Plan:   Post-operative Plan: Extubation in OR  Informed Consent: I have reviewed the patients History and Physical, chart, labs and discussed the procedure including the risks, benefits and alternatives for the proposed anesthesia with the patient or authorized representative who has indicated his/her understanding and acceptance.     Dental advisory given  Plan Discussed with: CRNA, Anesthesiologist and Surgeon  Anesthesia Plan Comments:        Anesthesia Quick Evaluation

## 2024-02-19 NOTE — Transfer of Care (Signed)
 Immediate Anesthesia Transfer of Care Note  Patient: Edgar Evans  Procedure(s) Performed: PHYLLIS BATTLE DILATION, BALLOON DILATION, RETROGRADE PYLEOGRAM  Patient Location: PACU  Anesthesia Type:General  Level of Consciousness: drowsy  Airway & Oxygen Therapy: Patient Spontanous Breathing and Patient connected to nasal cannula oxygen  Post-op Assessment: Report given to RN and Post -op Vital signs reviewed and stable  Post vital signs: Reviewed and stable  Last Vitals:  Vitals Value Taken Time  BP 112/60 02/19/24 15:38  Temp 36.7 C 02/19/24 15:38  Pulse 62 02/19/24 15:44  Resp 10 02/19/24 15:44  SpO2 99 % 02/19/24 15:44  Vitals shown include unfiled device data.  Last Pain:  Vitals:   02/19/24 1538  TempSrc:   PainSc: 0-No pain         Complications: No notable events documented.

## 2024-02-19 NOTE — Discharge Instructions (Signed)
 Discharge instructions following Urethral dilation   Call your doctor for: Fever is greater than 100.5 Severe nausea or vomiting Increasing pain not controlled by pain medication Increasing redness or drainage from incisions Catheter no longer draining  Clots in the catheter that are larger than the size of a quarter   DO NOT LET AN ER DOCTOR TOUCH THE CATHETER ONLY A UROLOGIST CAN TROUBLE SHOOT THE CATHETER   Medication: - methocarbamol   - miralax    The number for questions or concerns is [phone]  Activity level: No lifting greater than 10 pounds (about equal to milk) for the next 2 weeks or until cleared to do so at follow-up appointment.  Otherwise activity as tolerated by comfort level.  Diet: May resume your regular diet as tolerated.  Driving: No driving while still taking opiate pain medications (weight at least 6-8 hours after last dose).  No driving if you still sore from surgery as it may limit her ability to react quickly if necessary.   Shower/bath: May shower and get incision wet pad 24 hours post surgery.  Do not scrub vigorously for the next 2-3 weeks.  Do not soak incision (ID soaking in bath or swimming) until 3 weeks post incision.   Wound care: He may cover wounds with sterile gauze as needed to prevent incisions rubbing on close follow-up in any seepage.  Where tight fitting underpants/scrotal support for at least 2 weeks.  He should apply cold compresses (ice or sac of frozen peas/corn) to your scrotum for at least 48 hours to reduce the swelling for 15 minutes at a time indirectly.  You should expect that his scrotum will swell up initially and then get smaller over the next 2-4 weeks.  Follow-up appointments: Follow-up appointment will be scheduled with Dr. Shane for a wound check and retrograde urethrogram.

## 2024-02-20 ENCOUNTER — Encounter (HOSPITAL_COMMUNITY): Payer: Self-pay | Admitting: Urology

## 2024-02-21 NOTE — Anesthesia Postprocedure Evaluation (Signed)
 Anesthesia Post Note  Patient: Edgar Evans  Procedure(s) Performed: PHYLLIS BATTLE DILATION, BALLOON DILATION, RETROGRADE PYLEOGRAM     Patient location during evaluation: PACU Anesthesia Type: General Level of consciousness: awake and alert Pain management: pain level controlled Vital Signs Assessment: post-procedure vital signs reviewed and stable Respiratory status: spontaneous breathing, nonlabored ventilation, respiratory function stable and patient connected to nasal cannula oxygen Cardiovascular status: blood pressure returned to baseline and stable Postop Assessment: no apparent nausea or vomiting Anesthetic complications: no   No notable events documented.  Last Vitals:  Vitals:   02/19/24 1630 02/19/24 1645  BP: 123/63 116/63  Pulse: (!) 59 64  Resp:    Temp:    SpO2: 98% 97%    Last Pain:  Vitals:   02/19/24 1645  TempSrc:   PainSc: 4                  Eller Sweis S

## 2024-02-27 ENCOUNTER — Ambulatory Visit (INDEPENDENT_AMBULATORY_CARE_PROVIDER_SITE_OTHER): Payer: Medicare PPO

## 2024-02-27 DIAGNOSIS — I442 Atrioventricular block, complete: Secondary | ICD-10-CM | POA: Diagnosis not present

## 2024-02-27 LAB — CUP PACEART REMOTE DEVICE CHECK
Battery Remaining Longevity: 23 mo
Battery Remaining Percentage: 18 %
Battery Voltage: 2.9 V
Brady Statistic AP VP Percent: 1 %
Brady Statistic AP VS Percent: 1.4 %
Brady Statistic AS VP Percent: 1 %
Brady Statistic AS VS Percent: 98 %
Brady Statistic RA Percent Paced: 1.3 %
Brady Statistic RV Percent Paced: 1 %
Date Time Interrogation Session: 20250702020013
Implantable Lead Connection Status: 753985
Implantable Lead Connection Status: 753985
Implantable Lead Implant Date: 20150501
Implantable Lead Implant Date: 20150501
Implantable Lead Location: 753859
Implantable Lead Location: 753860
Implantable Lead Model: 1948
Implantable Pulse Generator Implant Date: 20150501
Lead Channel Impedance Value: 390 Ohm
Lead Channel Impedance Value: 590 Ohm
Lead Channel Pacing Threshold Amplitude: 0.375 V
Lead Channel Pacing Threshold Amplitude: 0.5 V
Lead Channel Pacing Threshold Pulse Width: 0.5 ms
Lead Channel Pacing Threshold Pulse Width: 0.5 ms
Lead Channel Sensing Intrinsic Amplitude: 12 mV
Lead Channel Sensing Intrinsic Amplitude: 5 mV
Lead Channel Setting Pacing Amplitude: 0.625
Lead Channel Setting Pacing Amplitude: 1.5 V
Lead Channel Setting Pacing Pulse Width: 0.5 ms
Lead Channel Setting Sensing Sensitivity: 2 mV
Pulse Gen Model: 2240
Pulse Gen Serial Number: 7618665

## 2024-03-03 ENCOUNTER — Ambulatory Visit: Payer: Self-pay | Admitting: Cardiovascular Disease

## 2024-04-03 ENCOUNTER — Other Ambulatory Visit: Payer: Self-pay | Admitting: Family Medicine

## 2024-04-03 MED ORDER — PERMETHRIN 5 % EX CREA: 1.0000 | TOPICAL_CREAM | Freq: Once | CUTANEOUS | 0 refills | Status: AC

## 2024-04-17 ENCOUNTER — Ambulatory Visit (HOSPITAL_BASED_OUTPATIENT_CLINIC_OR_DEPARTMENT_OTHER): Admitting: Pulmonary Disease

## 2024-04-17 ENCOUNTER — Encounter (HOSPITAL_BASED_OUTPATIENT_CLINIC_OR_DEPARTMENT_OTHER): Payer: Self-pay | Admitting: Pulmonary Disease

## 2024-04-17 ENCOUNTER — Ambulatory Visit (HOSPITAL_BASED_OUTPATIENT_CLINIC_OR_DEPARTMENT_OTHER)

## 2024-04-17 VITALS — BP 124/78 | HR 84 | Ht 72.0 in | Wt 184.1 lb

## 2024-04-17 DIAGNOSIS — J849 Interstitial pulmonary disease, unspecified: Secondary | ICD-10-CM | POA: Diagnosis not present

## 2024-04-17 DIAGNOSIS — J8410 Pulmonary fibrosis, unspecified: Secondary | ICD-10-CM

## 2024-04-17 DIAGNOSIS — Z87891 Personal history of nicotine dependence: Secondary | ICD-10-CM | POA: Diagnosis not present

## 2024-04-17 NOTE — Patient Instructions (Signed)
  VISIT SUMMARY: You had a follow-up appointment today to check on your interstitial lung disease. Your breathing remains stable, and there are no new symptoms such as coughing. You have chosen not to pursue aggressive testing or treatments, and you are not on any medication for this condition. You also do not wish to use oxygen therapy at this time. You mentioned a persistent 'rattling' sound in your chest, which has been present for some time.  YOUR PLAN: -INTERSTITIAL LUNG DISEASE WITH PULMONARY FIBROSIS: Interstitial lung disease is a group of lung disorders that cause scarring of lung tissues, making it difficult to breathe. Your condition is stable with no new symptoms, and your oxygen levels are satisfactory. We will order a chest x-ray to assess the current status of your lung disease and perform an ambulatory saturation test to evaluate your oxygen levels during activity. You have chosen not to pursue aggressive treatments or oxygen therapy at this time.  INSTRUCTIONS: Please complete the chest x-ray and ambulatory saturation test as soon as possible. We will schedule a follow-up appointment in six months to review your condition and any test results.                      Contains text generated by [employer]bridge.                                 Contains text generated by [employer]bridge.

## 2024-04-17 NOTE — Progress Notes (Signed)
 Subjective:    Patient ID: Edgar Evans, male    DOB: 1941-08-19, 83 y.o.   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  History of Present Illness    Discussed the use of AI scribe software for clinical note transcription with the patient, who gave verbal consent to proceed.  History of Present Illness   Edgar Evans is an 83 year old male with interstitial lung disease who presents for a 58-month follow-up.  His breathing remains stable with no new symptoms such as coughing. He has interstitial lung disease and has opted against aggressive testing and antifibrotic treatments. He is not on medication for interstitial lung disease and does not wish to pursue oxygen therapy. He experiences a persistent 'rattling' sound in his chest, which has been present for some time.        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  patchy areas of peripheral predominant groundglass attenuation and septal thickening mostly in the mid to lower lungs with minimal bronchiectasis, no definite honeycombing hence called indeterminate for UIP.    ANA neg, CCP neg, ACE <1,  ESR 13  PFTs 08/2021 mild restriction, FVC stable at 69%, TLC 66%, DLCO 21.9/85% PFTs 10/2020 stable, FVC 69%, TLC 64%, DLCO 83%     PFTs 10/29/2019 - FVC 3.18 (72%), FEV1 2.77 (88%), ratio 87, TLC 55%, DLCOcor 77%, 19.96   PFTs 09/2018 -FVC 67%, FEV1 80%, ratio 86 TLC 57% , DLCO 76%, 19.89   Review of Systems  neg for any significant sore throat, dysphagia, itching, sneezing, nasal congestion or excess/ purulent secretions, fever, chills, sweats, unintended wt loss, pleuritic or exertional cp, hempoptysis, orthopnea pnd or change in chronic leg swelling. Also denies presyncope, palpitations, heartburn, abdominal pain, nausea, vomiting, diarrhea or change in bowel or urinary habits, dysuria,hematuria, rash, arthralgias, visual complaints, headache, numbness weakness or ataxia.      Objective:   Physical Exam  Gen. Pleasant, well-nourished, in no distress ENT - no thrush, no pallor/icterus,no post nasal drip Neck: No JVD, no thyromegaly, no carotid bruits Lungs: no use of accessory muscles, no dullness to percussion, clear without rales or rhonchi  Cardiovascular: Rhythm regular, heart sounds  normal, no murmurs or gallops, no peripheral edema Musculoskeletal: No deformities, no cyanosis or clubbing        Assessment & Plan:   Assessment and Plan Assessment & Plan  Assessment and Plan    Interstitial lung disease with pulmonary fibrosis ILD is well-managed with no new symptoms such as cough or dyspnea. Oxygen saturation is satisfactory. He has not responded to antifibrotics  and opts against aggressive testing or treatment. Chronic lung base rattling is present. He declines oxygen therapy. - Order chest x-ray to assess current ILD status. - Perform ambulatory saturation test to evaluate exertional oxygen levels. - Schedule follow-up in six months.

## 2024-04-23 ENCOUNTER — Ambulatory Visit: Payer: Self-pay | Admitting: Pulmonary Disease

## 2024-04-23 NOTE — Progress Notes (Signed)
Pt notified on VM. 

## 2024-05-28 ENCOUNTER — Ambulatory Visit: Payer: Medicare PPO

## 2024-05-28 DIAGNOSIS — I442 Atrioventricular block, complete: Secondary | ICD-10-CM

## 2024-05-29 LAB — CUP PACEART REMOTE DEVICE CHECK
Battery Remaining Longevity: 20 mo
Battery Remaining Percentage: 16 %
Battery Voltage: 2.89 V
Brady Statistic AP VP Percent: 1 %
Brady Statistic AP VS Percent: 1.1 %
Brady Statistic AS VP Percent: 1 %
Brady Statistic AS VS Percent: 99 %
Brady Statistic RA Percent Paced: 1 %
Brady Statistic RV Percent Paced: 1 %
Date Time Interrogation Session: 20251001022557
Implantable Lead Connection Status: 753985
Implantable Lead Connection Status: 753985
Implantable Lead Implant Date: 20150501
Implantable Lead Implant Date: 20150501
Implantable Lead Location: 753859
Implantable Lead Location: 753860
Implantable Lead Model: 1948
Implantable Pulse Generator Implant Date: 20150501
Lead Channel Impedance Value: 380 Ohm
Lead Channel Impedance Value: 550 Ohm
Lead Channel Pacing Threshold Amplitude: 0.375 V
Lead Channel Pacing Threshold Amplitude: 0.5 V
Lead Channel Pacing Threshold Pulse Width: 0.5 ms
Lead Channel Pacing Threshold Pulse Width: 0.5 ms
Lead Channel Sensing Intrinsic Amplitude: 10.8 mV
Lead Channel Sensing Intrinsic Amplitude: 5 mV
Lead Channel Setting Pacing Amplitude: 0.625
Lead Channel Setting Pacing Amplitude: 1.5 V
Lead Channel Setting Pacing Pulse Width: 0.5 ms
Lead Channel Setting Sensing Sensitivity: 2 mV
Pulse Gen Model: 2240
Pulse Gen Serial Number: 7618665

## 2024-06-02 NOTE — Progress Notes (Signed)
 Remote PPM Transmission

## 2024-06-05 NOTE — Progress Notes (Signed)
 Remote PPM Transmission

## 2024-06-09 ENCOUNTER — Ambulatory Visit: Payer: Self-pay | Admitting: Cardiovascular Disease

## 2024-07-29 ENCOUNTER — Ambulatory Visit: Payer: Self-pay

## 2024-07-29 NOTE — Telephone Encounter (Signed)
 FYI Only or Action Required?: Action required by provider: request for appointment.  Patient was last seen in primary care on .  Called Nurse Triage reporting Cough.  Symptoms began several weeks ago.  Interventions attempted: OTC not helping  Symptoms are: unchanged.  Triage Disposition: No disposition on file.  Patient/caregiver understands and will follow disposition?:      Answer Assessment - Initial Assessment Questions 1. ONSET: When did the cough begin?      Last week 2. SEVERITY: How bad is the cough today?      severe 3. SPUTUM: Describe the color of your sputum (e.g., none, dry cough; clear, white, yellow, green)     clear 4. HEMOPTYSIS: Are you coughing up any blood? If Yes, ask: How much? (e.g., flecks, streaks, tablespoons, etc.)     no 5. DIFFICULTY BREATHING: Are you having difficulty breathing? If Yes, ask: How bad is it? (e.g., mild, moderate, severe)      no 6. FEVER: Do you have a fever? If Yes, ask: What is your temperature, how was it measured, and when did it start?     no 7. CARDIAC HISTORY: Do you have any history of heart disease? (e.g., heart attack, congestive heart failure)      no 8. LUNG HISTORY: Do you have any history of lung disease?  (e.g., pulmonary embolus, asthma, emphysema)     yes 9. PE RISK FACTORS: Do you have a history of blood clots? (or: recent major surgery, recent prolonged travel, bedridden)     no 10. OTHER SYMPTOMS: Do you have any other symptoms? (e.g., runny nose, wheezing, chest pain)       Sinus drainage 11. PREGNANCY: Is there any chance you are pregnant? When was your last menstrual period?       N/a 12. TRAVEL: Have you traveled out of the country in the last month? (e.g., travel history, exposures)       no  Protocols used: Cough - Acute Productive-A-AH

## 2024-07-31 ENCOUNTER — Ambulatory Visit: Admitting: Family Medicine

## 2024-07-31 ENCOUNTER — Encounter: Payer: Self-pay | Admitting: Family Medicine

## 2024-07-31 VITALS — BP 130/70 | HR 86 | Temp 98.5°F | Ht 72.0 in | Wt 184.0 lb

## 2024-07-31 DIAGNOSIS — J329 Chronic sinusitis, unspecified: Secondary | ICD-10-CM

## 2024-07-31 DIAGNOSIS — M25511 Pain in right shoulder: Secondary | ICD-10-CM

## 2024-07-31 MED ORDER — LORATADINE 10 MG PO TABS: 10.0000 mg | ORAL_TABLET | Freq: Every day | ORAL | 0 refills | Status: AC

## 2024-07-31 MED ORDER — TRIAMCINOLONE ACETONIDE 40 MG/ML IJ SUSP
80.0000 mg | Freq: Once | INTRAMUSCULAR | Status: AC
  Administered 2024-07-31: 80 mg via INTRA_ARTICULAR

## 2024-07-31 MED ORDER — AZITHROMYCIN 250 MG PO TABS: ORAL_TABLET | ORAL | 0 refills | Status: DC

## 2024-07-31 MED ORDER — FLUTICASONE PROPIONATE 50 MCG/ACT NA SUSP: 2.0000 | Freq: Every day | NASAL | 0 refills | Status: AC

## 2024-07-31 NOTE — Addendum Note (Signed)
 Addended by: ANGELENA RONAL BRADLEY K on: 07/31/2024 04:10 PM   Modules accepted: Orders
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
## 2024-08-27 ENCOUNTER — Ambulatory Visit: Payer: Medicare PPO

## 2024-08-27 DIAGNOSIS — I442 Atrioventricular block, complete: Secondary | ICD-10-CM | POA: Diagnosis not present

## 2024-08-28 LAB — CUP PACEART REMOTE DEVICE CHECK
Battery Remaining Longevity: 18 mo
Battery Remaining Percentage: 14 %
Battery Voltage: 2.87 V
Brady Statistic AP VP Percent: 1 %
Brady Statistic AP VS Percent: 1.1 %
Brady Statistic AS VP Percent: 1 %
Brady Statistic AS VS Percent: 99 %
Brady Statistic RA Percent Paced: 1 %
Brady Statistic RV Percent Paced: 1 %
Date Time Interrogation Session: 20251231020014
Implantable Lead Connection Status: 753985
Implantable Lead Connection Status: 753985
Implantable Lead Implant Date: 20150501
Implantable Lead Implant Date: 20150501
Implantable Lead Location: 753859
Implantable Lead Location: 753860
Implantable Lead Model: 1948
Implantable Pulse Generator Implant Date: 20150501
Lead Channel Impedance Value: 380 Ohm
Lead Channel Impedance Value: 550 Ohm
Lead Channel Pacing Threshold Amplitude: 0.5 V
Lead Channel Pacing Threshold Amplitude: 0.5 V
Lead Channel Pacing Threshold Pulse Width: 0.5 ms
Lead Channel Pacing Threshold Pulse Width: 0.5 ms
Lead Channel Sensing Intrinsic Amplitude: 5 mV
Lead Channel Sensing Intrinsic Amplitude: 8.6 mV
Lead Channel Setting Pacing Amplitude: 0.75 V
Lead Channel Setting Pacing Amplitude: 1.5 V
Lead Channel Setting Pacing Pulse Width: 0.5 ms
Lead Channel Setting Sensing Sensitivity: 2 mV
Pulse Gen Model: 2240
Pulse Gen Serial Number: 7618665

## 2024-09-01 NOTE — Progress Notes (Signed)
 Remote PPM Transmission

## 2024-09-06 ENCOUNTER — Ambulatory Visit: Payer: Self-pay | Admitting: Cardiovascular Disease

## 2024-09-11 ENCOUNTER — Ambulatory Visit

## 2024-09-18 ENCOUNTER — Ambulatory Visit

## 2024-09-18 VITALS — Ht 72.0 in | Wt 184.0 lb

## 2024-09-18 DIAGNOSIS — Z0000 Encounter for general adult medical examination without abnormal findings: Secondary | ICD-10-CM | POA: Diagnosis not present

## 2024-09-18 NOTE — Patient Instructions (Signed)
 [name],  Thank you for taking the time for your Medicare Wellness Visit. I appreciate your continued commitment to your health goals. Please review the care plan we discussed, and feel free to reach out if I can assist you further.  Please note that Annual Wellness Visits do not include a physical exam. Some assessments may be limited, especially if the visit was conducted virtually. If needed, we may recommend an in-person follow-up with your provider.  Ongoing Care Seeing your primary care provider every 3 to 6 months helps us  monitor your health and provide consistent, personalized care.   Referrals If a referral was made during today's visit and you haven't received any updates within two weeks, please contact the referred provider directly to check on the status.  Recommended Screenings:  Health Maintenance  Topic Date Due   Kidney health urinalysis for diabetes  Never done   Complete foot exam   08/25/2020   COVID-19 Vaccine (7 - 2025-26 season) 04/28/2024   Hemoglobin A1C  08/09/2024   Yearly kidney function blood test for diabetes  02/07/2025   Eye exam for diabetics  06/30/2025   Medicare Annual Wellness Visit  09/18/2025   Pneumococcal Vaccine for age over 1  Completed   Flu Shot  Completed   Zoster (Shingles) Vaccine  Completed   Meningitis B Vaccine  Aged Out   DTaP/Tdap/Td vaccine  Discontinued       09/18/2024    8:38 AM  Advanced Directives  Does Patient Have a Medical Advance Directive? No  Would patient like information on creating a medical advance directive? Yes (MAU/Ambulatory/Procedural Areas - Information given)   Information on Advanced Care Planning can be found at [place]  Secretary of [facility] Advance Health Care Directives Advance Health Care Directives ([email or web address]   Vision: Annual vision screenings are recommended for early detection of glaucoma, cataracts, and diabetic retinopathy. These exams can also reveal signs of chronic conditions such  as diabetes and high blood pressure.  Dental: Annual dental screenings help detect early signs of oral cancer, gum disease, and other conditions linked to overall health, including heart disease and diabetes.  Please see the attached documents for additional preventive care recommendations.
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
## 2024-10-23 ENCOUNTER — Ambulatory Visit: Admitting: Family Medicine

## 2024-11-17 ENCOUNTER — Ambulatory Visit (HOSPITAL_BASED_OUTPATIENT_CLINIC_OR_DEPARTMENT_OTHER): Admitting: Pulmonary Disease
